# Patient Record
Sex: Male | Born: 1940
Health system: Southern US, Community
[De-identification: ages and names within clinical notes are randomized; demographics above are authoritative.]

## PROBLEM LIST (undated history)

## (undated) DIAGNOSIS — H409 Unspecified glaucoma: Secondary | ICD-10-CM

## (undated) DIAGNOSIS — R351 Nocturia: Secondary | ICD-10-CM

## (undated) DIAGNOSIS — E785 Hyperlipidemia, unspecified: Secondary | ICD-10-CM

## (undated) DIAGNOSIS — Z9989 Dependence on other enabling machines and devices: Secondary | ICD-10-CM

## (undated) DIAGNOSIS — H269 Unspecified cataract: Secondary | ICD-10-CM

## (undated) DIAGNOSIS — N529 Male erectile dysfunction, unspecified: Secondary | ICD-10-CM

## (undated) DIAGNOSIS — K4090 Unilateral inguinal hernia, without obstruction or gangrene, not specified as recurrent: Secondary | ICD-10-CM

## (undated) DIAGNOSIS — G4733 Obstructive sleep apnea (adult) (pediatric): Secondary | ICD-10-CM

## (undated) DIAGNOSIS — M1990 Unspecified osteoarthritis, unspecified site: Secondary | ICD-10-CM

## (undated) DIAGNOSIS — I10 Essential (primary) hypertension: Secondary | ICD-10-CM

## (undated) DIAGNOSIS — Z86010 Personal history of colonic polyps: Secondary | ICD-10-CM

## (undated) DIAGNOSIS — D696 Thrombocytopenia, unspecified: Secondary | ICD-10-CM

## (undated) DIAGNOSIS — G4730 Sleep apnea, unspecified: Secondary | ICD-10-CM

## (undated) HISTORY — DX: Sleep apnea, unspecified: G47.30

## (undated) HISTORY — DX: Unspecified osteoarthritis, unspecified site: M19.90

## (undated) HISTORY — DX: Unspecified cataract: H26.9

## (undated) HISTORY — DX: Male erectile dysfunction, unspecified: N52.9

## (undated) HISTORY — DX: Obstructive sleep apnea (adult) (pediatric): G47.33

## (undated) HISTORY — DX: Hyperlipidemia, unspecified: E78.5

## (undated) HISTORY — DX: Thrombocytopenia, unspecified: D69.6

## (undated) HISTORY — DX: Unspecified glaucoma: H40.9

## (undated) HISTORY — DX: Nocturia: R35.1

## (undated) HISTORY — DX: Dependence on other enabling machines and devices: Z99.89

## (undated) HISTORY — DX: Personal history of colonic polyps: Z86.010

## (undated) HISTORY — PX: OTHER SURGICAL HISTORY: SHX169

## (undated) HISTORY — DX: Essential (primary) hypertension: I10

---

## 1998-12-24 HISTORY — PX: OTHER SURGICAL HISTORY: SHX169

## 2002-12-24 HISTORY — PX: KNEE ARTHROSCOPY: SUR90

## 2006-04-18 ENCOUNTER — Encounter: Admission: RE | Admit: 2006-04-18 | Discharge: 2006-04-18 | Payer: Self-pay | Admitting: Family Medicine

## 2006-11-01 ENCOUNTER — Ambulatory Visit: Payer: Self-pay | Admitting: Family Medicine

## 2006-11-01 ENCOUNTER — Encounter: Admission: RE | Admit: 2006-11-01 | Discharge: 2006-11-01 | Payer: Self-pay | Admitting: Family Medicine

## 2006-11-01 LAB — CONVERTED CEMR LAB
Calcium: 9.7 mg/dL (ref 8.4–10.5)
Sodium: 140 meq/L (ref 135–145)

## 2006-11-12 ENCOUNTER — Ambulatory Visit: Payer: Self-pay | Admitting: Family Medicine

## 2006-11-12 LAB — CONVERTED CEMR LAB
ALT: 38 units/L (ref 0–40)
AST: 28 units/L (ref 0–37)
Chol/HDL Ratio, serum: 3.5
HDL: 46.7 mg/dL (ref >39.0)
LDL Cholesterol: 101 mg/dL — ABNORMAL HIGH (ref 0–99)
Triglyceride fasting, serum: 76 mg/dL (ref 0–149)
VLDL: 15 mg/dL (ref 0–40)

## 2006-11-19 ENCOUNTER — Encounter: Admission: RE | Admit: 2006-11-19 | Discharge: 2006-12-30 | Payer: Self-pay | Admitting: Family Medicine

## 2006-12-24 HISTORY — PX: OTHER SURGICAL HISTORY: SHX169

## 2007-05-01 DIAGNOSIS — E785 Hyperlipidemia, unspecified: Secondary | ICD-10-CM | POA: Insufficient documentation

## 2007-05-01 DIAGNOSIS — K5730 Diverticulosis of large intestine without perforation or abscess without bleeding: Secondary | ICD-10-CM | POA: Insufficient documentation

## 2007-05-01 DIAGNOSIS — F528 Other sexual dysfunction not due to a substance or known physiological condition: Secondary | ICD-10-CM

## 2007-05-01 DIAGNOSIS — I10 Essential (primary) hypertension: Secondary | ICD-10-CM | POA: Insufficient documentation

## 2007-05-05 ENCOUNTER — Encounter: Payer: Self-pay | Admitting: Family Medicine

## 2007-05-05 ENCOUNTER — Encounter (INDEPENDENT_AMBULATORY_CARE_PROVIDER_SITE_OTHER): Payer: Self-pay | Admitting: Family Medicine

## 2007-05-05 ENCOUNTER — Ambulatory Visit: Payer: Self-pay | Admitting: Family Medicine

## 2007-05-09 ENCOUNTER — Telehealth (INDEPENDENT_AMBULATORY_CARE_PROVIDER_SITE_OTHER): Payer: Self-pay | Admitting: *Deleted

## 2007-05-09 LAB — CONVERTED CEMR LAB
AST: 26 units/L (ref 0–37)
BUN: 12 mg/dL (ref 6–23)
Basophils Relative: 0.2 % (ref 0.0–1.0)
Calcium: 9.5 mg/dL (ref 8.4–10.5)
Cholesterol: 176 mg/dL (ref 0–200)
Eosinophils Absolute: 0.1 10*3/uL (ref 0.0–0.6)
Eosinophils Relative: 2.2 % (ref 0.0–5.0)
MCHC: 34.1 g/dL (ref 30.0–36.0)
Monocytes Absolute: 0.8 10*3/uL — ABNORMAL HIGH (ref 0.2–0.7)
Monocytes Relative: 10.8 % (ref 3.0–11.0)
Neutro Abs: 4.6 10*3/uL (ref 1.4–7.7)
Neutrophils Relative %: 68.6 % (ref 43.0–77.0)
RBC: 5.72 M/uL (ref 4.22–5.81)
Sodium: 139 meq/L (ref 135–145)
Triglycerides: 98 mg/dL (ref 0–149)
WBC: 6.8 10*3/uL (ref 4.5–10.5)

## 2007-05-12 ENCOUNTER — Telehealth (INDEPENDENT_AMBULATORY_CARE_PROVIDER_SITE_OTHER): Payer: Self-pay | Admitting: *Deleted

## 2007-05-22 ENCOUNTER — Ambulatory Visit: Payer: Self-pay | Admitting: Internal Medicine

## 2007-05-29 ENCOUNTER — Ambulatory Visit: Payer: Self-pay | Admitting: Family Medicine

## 2007-06-02 ENCOUNTER — Encounter (INDEPENDENT_AMBULATORY_CARE_PROVIDER_SITE_OTHER): Payer: Self-pay | Admitting: Family Medicine

## 2007-06-02 ENCOUNTER — Telehealth (INDEPENDENT_AMBULATORY_CARE_PROVIDER_SITE_OTHER): Payer: Self-pay | Admitting: *Deleted

## 2007-06-10 ENCOUNTER — Ambulatory Visit: Payer: Self-pay | Admitting: Family Medicine

## 2007-06-10 ENCOUNTER — Telehealth (INDEPENDENT_AMBULATORY_CARE_PROVIDER_SITE_OTHER): Payer: Self-pay | Admitting: Family Medicine

## 2007-06-12 ENCOUNTER — Ambulatory Visit: Payer: Self-pay | Admitting: Internal Medicine

## 2007-06-12 ENCOUNTER — Encounter (INDEPENDENT_AMBULATORY_CARE_PROVIDER_SITE_OTHER): Payer: Self-pay | Admitting: Family Medicine

## 2007-07-04 ENCOUNTER — Encounter (INDEPENDENT_AMBULATORY_CARE_PROVIDER_SITE_OTHER): Payer: Self-pay | Admitting: Family Medicine

## 2007-07-23 ENCOUNTER — Encounter (INDEPENDENT_AMBULATORY_CARE_PROVIDER_SITE_OTHER): Payer: Self-pay | Admitting: Family Medicine

## 2007-08-15 ENCOUNTER — Ambulatory Visit (HOSPITAL_BASED_OUTPATIENT_CLINIC_OR_DEPARTMENT_OTHER): Admission: RE | Admit: 2007-08-15 | Discharge: 2007-08-15 | Payer: Self-pay | Admitting: General Surgery

## 2007-08-15 ENCOUNTER — Encounter (HOSPITAL_BASED_OUTPATIENT_CLINIC_OR_DEPARTMENT_OTHER): Payer: Self-pay | Admitting: General Surgery

## 2007-09-12 ENCOUNTER — Ambulatory Visit: Payer: Self-pay | Admitting: Family Medicine

## 2007-09-12 DIAGNOSIS — D696 Thrombocytopenia, unspecified: Secondary | ICD-10-CM

## 2007-09-24 ENCOUNTER — Telehealth (INDEPENDENT_AMBULATORY_CARE_PROVIDER_SITE_OTHER): Payer: Self-pay | Admitting: *Deleted

## 2007-09-24 LAB — CONVERTED CEMR LAB
BUN: 16 mg/dL (ref 6–23)
CO2: 33 meq/L — ABNORMAL HIGH (ref 19–32)
GFR calc Af Amer: 86 mL/min
Glucose, Bld: 76 mg/dL (ref 70–99)
HCT: 45 % (ref 39.0–52.0)
Hemoglobin: 15.3 g/dL (ref 13.0–17.0)
Lymphocytes Relative: 22.4 % (ref 12.0–46.0)
MCHC: 34 g/dL (ref 30.0–36.0)
Monocytes Relative: 16.8 % — ABNORMAL HIGH (ref 3.0–11.0)
Platelets: 111 10*3/uL — ABNORMAL LOW (ref 150–400)
RBC: 5.32 M/uL (ref 4.22–5.81)
RDW: 12 % (ref 11.5–14.6)
Sodium: 141 meq/L (ref 135–145)
WBC: 5.7 10*3/uL (ref 4.5–10.5)

## 2007-09-26 ENCOUNTER — Ambulatory Visit: Payer: Self-pay | Admitting: Hematology and Oncology

## 2007-10-17 ENCOUNTER — Encounter (INDEPENDENT_AMBULATORY_CARE_PROVIDER_SITE_OTHER): Payer: Self-pay | Admitting: Family Medicine

## 2007-10-17 LAB — CBC & DIFF AND RETIC
BASO%: 0.2 % (ref 0.0–2.0)
IRF: 0.4 — ABNORMAL HIGH (ref 0.070–0.380)
MCHC: 34.4 g/dL (ref 32.0–35.9)
MCV: 81.8 fL (ref 81.6–98.0)
MONO#: 0.5 10*3/uL (ref 0.1–0.9)
RBC: 5.81 10*6/uL — ABNORMAL HIGH (ref 4.20–5.71)
RDW: 12.9 % (ref 11.2–14.6)
Retic %: 2.1 % (ref 0.7–2.3)
WBC: 5.3 10*3/uL (ref 4.0–10.0)
lymph#: 0.9 10*3/uL (ref 0.9–3.3)

## 2007-10-17 LAB — CHCC SMEAR

## 2007-10-21 LAB — PROTEIN ELECTROPHORESIS, SERUM
Albumin ELP: 58.7 % (ref 55.8–66.1)
Alpha-1-Globulin: 3.7 % (ref 2.9–4.9)
Alpha-2-Globulin: 10.3 % (ref 7.1–11.8)
Gamma Globulin: 16.2 % (ref 11.1–18.8)

## 2007-10-21 LAB — COMPREHENSIVE METABOLIC PANEL
Alkaline Phosphatase: 71 U/L (ref 39–117)
Glucose, Bld: 154 mg/dL — ABNORMAL HIGH (ref 70–99)
Sodium: 139 mEq/L (ref 135–145)
Total Bilirubin: 0.5 mg/dL (ref 0.3–1.2)
Total Protein: 7 g/dL (ref 6.0–8.3)

## 2007-10-21 LAB — VITAMIN B12: Vitamin B-12: 742 pg/mL (ref 211–911)

## 2007-10-29 ENCOUNTER — Ambulatory Visit (HOSPITAL_COMMUNITY): Admission: RE | Admit: 2007-10-29 | Discharge: 2007-10-29 | Payer: Self-pay | Admitting: Hematology and Oncology

## 2008-01-09 ENCOUNTER — Ambulatory Visit: Payer: Self-pay | Admitting: Family Medicine

## 2008-01-13 ENCOUNTER — Encounter (INDEPENDENT_AMBULATORY_CARE_PROVIDER_SITE_OTHER): Payer: Self-pay | Admitting: *Deleted

## 2008-01-13 LAB — CONVERTED CEMR LAB
AST: 25 units/L (ref 0–37)
BUN: 17 mg/dL (ref 6–23)
CO2: 34 meq/L — ABNORMAL HIGH (ref 19–32)
Cholesterol: 175 mg/dL (ref 0–200)
GFR calc non Af Amer: 58 mL/min
Glucose, Bld: 108 mg/dL — ABNORMAL HIGH (ref 70–99)
HDL: 42.8 mg/dL (ref >39.0)
VLDL: 24 mg/dL (ref 0–40)

## 2008-02-17 ENCOUNTER — Ambulatory Visit: Payer: Self-pay | Admitting: Family Medicine

## 2008-02-17 DIAGNOSIS — D485 Neoplasm of uncertain behavior of skin: Secondary | ICD-10-CM

## 2008-02-25 ENCOUNTER — Telehealth (INDEPENDENT_AMBULATORY_CARE_PROVIDER_SITE_OTHER): Payer: Self-pay | Admitting: Family Medicine

## 2008-03-01 ENCOUNTER — Encounter (INDEPENDENT_AMBULATORY_CARE_PROVIDER_SITE_OTHER): Payer: Self-pay | Admitting: *Deleted

## 2008-03-15 ENCOUNTER — Encounter (INDEPENDENT_AMBULATORY_CARE_PROVIDER_SITE_OTHER): Payer: Self-pay | Admitting: Family Medicine

## 2008-04-14 ENCOUNTER — Ambulatory Visit: Payer: Self-pay | Admitting: Hematology and Oncology

## 2008-05-07 ENCOUNTER — Ambulatory Visit: Payer: Self-pay | Admitting: Internal Medicine

## 2008-05-14 ENCOUNTER — Telehealth (INDEPENDENT_AMBULATORY_CARE_PROVIDER_SITE_OTHER): Payer: Self-pay | Admitting: *Deleted

## 2008-05-28 ENCOUNTER — Ambulatory Visit: Payer: Self-pay | Admitting: Internal Medicine

## 2008-05-31 ENCOUNTER — Encounter (INDEPENDENT_AMBULATORY_CARE_PROVIDER_SITE_OTHER): Payer: Self-pay | Admitting: *Deleted

## 2008-05-31 LAB — CONVERTED CEMR LAB
BUN: 13 mg/dL (ref 6–23)
CO2: 29 meq/L (ref 19–32)
Chloride: 101 meq/L (ref 96–112)
Glucose, Bld: 79 mg/dL (ref 70–99)

## 2008-06-03 ENCOUNTER — Ambulatory Visit: Payer: Self-pay | Admitting: Hematology and Oncology

## 2008-08-06 ENCOUNTER — Ambulatory Visit: Payer: Self-pay | Admitting: Internal Medicine

## 2008-08-06 DIAGNOSIS — R252 Cramp and spasm: Secondary | ICD-10-CM | POA: Insufficient documentation

## 2008-08-11 ENCOUNTER — Telehealth (INDEPENDENT_AMBULATORY_CARE_PROVIDER_SITE_OTHER): Payer: Self-pay | Admitting: *Deleted

## 2008-08-11 LAB — CONVERTED CEMR LAB
CO2: 30 meq/L (ref 19–32)
GFR calc Af Amer: 95 mL/min
GFR calc non Af Amer: 79 mL/min
Glucose, Bld: 84 mg/dL (ref 70–99)
Potassium: 4 meq/L (ref 3.5–5.1)

## 2008-08-12 ENCOUNTER — Telehealth: Payer: Self-pay | Admitting: Internal Medicine

## 2008-10-06 ENCOUNTER — Ambulatory Visit: Payer: Self-pay | Admitting: Internal Medicine

## 2008-10-06 DIAGNOSIS — M1990 Unspecified osteoarthritis, unspecified site: Secondary | ICD-10-CM

## 2008-12-06 ENCOUNTER — Ambulatory Visit: Payer: Self-pay | Admitting: Internal Medicine

## 2009-05-03 ENCOUNTER — Ambulatory Visit: Payer: Self-pay | Admitting: Internal Medicine

## 2009-05-03 DIAGNOSIS — R399 Unspecified symptoms and signs involving the genitourinary system: Secondary | ICD-10-CM | POA: Insufficient documentation

## 2009-05-03 LAB — CONVERTED CEMR LAB
Ketones, urine, test strip: NEGATIVE
Nitrite: NEGATIVE
Protein, U semiquant: NEGATIVE
RBC / HPF: NONE SEEN (ref <3)
Specific Gravity, Urine: <1.005

## 2009-05-04 ENCOUNTER — Encounter: Payer: Self-pay | Admitting: Internal Medicine

## 2009-05-10 ENCOUNTER — Ambulatory Visit: Payer: Self-pay | Admitting: Internal Medicine

## 2009-05-16 ENCOUNTER — Ambulatory Visit: Payer: Self-pay | Admitting: Internal Medicine

## 2009-05-19 LAB — CONVERTED CEMR LAB
ALT: 28 units/L (ref 0–53)
Basophils Relative: 0.4 % (ref 0.0–3.0)
Calcium: 8.9 mg/dL (ref 8.4–10.5)
Chloride: 108 meq/L (ref 96–112)
Eosinophils Relative: 1.4 % (ref 0.0–5.0)
HCT: 44.9 % (ref 39.0–52.0)
Hemoglobin: 15.3 g/dL (ref 13.0–17.0)
Hgb A1c MFr Bld: 4.8 % (ref 4.6–6.5)
LDL Cholesterol: 78 mg/dL (ref 0–99)
MCHC: 34.1 g/dL (ref 30.0–36.0)
MCV: 84.8 fL (ref 78.0–100.0)
Neutrophils Relative %: 68.4 % (ref 43.0–77.0)
Platelets: 94 10*3/uL — ABNORMAL LOW (ref 150.0–400.0)
Potassium: 4.3 meq/L (ref 3.5–5.1)
Total CHOL/HDL Ratio: 3
VLDL: 11.4 mg/dL (ref 0.0–40.0)
WBC: 6.2 10*3/uL (ref 4.5–10.5)

## 2010-01-09 ENCOUNTER — Ambulatory Visit: Payer: Self-pay | Admitting: Internal Medicine

## 2010-01-18 ENCOUNTER — Encounter: Payer: Self-pay | Admitting: Internal Medicine

## 2010-05-10 ENCOUNTER — Ambulatory Visit: Payer: Self-pay | Admitting: Internal Medicine

## 2010-05-10 LAB — CONVERTED CEMR LAB
Nitrite: NEGATIVE
Specific Gravity, Urine: <1.005
pH: 7.5

## 2010-05-12 ENCOUNTER — Telehealth (INDEPENDENT_AMBULATORY_CARE_PROVIDER_SITE_OTHER): Payer: Self-pay | Admitting: *Deleted

## 2010-05-12 LAB — CONVERTED CEMR LAB
Basophils Absolute: 0 10*3/uL (ref 0.0–0.1)
CO2: 30 meq/L (ref 19–32)
Calcium: 9.1 mg/dL (ref 8.4–10.5)
Casts: NONE SEEN /lpf
Cholesterol: 180 mg/dL (ref 0–200)
Eosinophils Absolute: 0.1 10*3/uL (ref 0.0–0.7)
GFR calc non Af Amer: 101.68 mL/min (ref >60)
Glucose, Bld: 80 mg/dL (ref 70–99)
HDL: 65.2 mg/dL (ref >39.00)
Hemoglobin: 15.6 g/dL (ref 13.0–17.0)
LDL Cholesterol: 109 mg/dL — ABNORMAL HIGH (ref 0–99)
Lymphocytes Relative: 20.3 % (ref 12.0–46.0)
MCHC: 34.1 g/dL (ref 30.0–36.0)
MCV: 85.7 fL (ref 78.0–100.0)
Neutrophils Relative %: 63.2 % (ref 43.0–77.0)
RBC / HPF: NONE SEEN (ref <3)
Squamous Epithelial / LPF: NONE SEEN /lpf
Total CHOL/HDL Ratio: 3
Triglycerides: 29 mg/dL (ref 0.0–149.0)
WBC: 4.2 10*3/uL — ABNORMAL LOW (ref 4.5–10.5)

## 2010-05-17 ENCOUNTER — Telehealth (INDEPENDENT_AMBULATORY_CARE_PROVIDER_SITE_OTHER): Payer: Self-pay | Admitting: *Deleted

## 2010-05-19 ENCOUNTER — Ambulatory Visit: Payer: Self-pay | Admitting: Internal Medicine

## 2010-05-19 LAB — CONVERTED CEMR LAB
Casts: NONE SEEN /lpf
Squamous Epithelial / LPF: NONE SEEN /lpf
WBC, UA: NONE SEEN cells/hpf (ref <3)

## 2010-05-20 ENCOUNTER — Encounter: Payer: Self-pay | Admitting: Internal Medicine

## 2010-05-25 ENCOUNTER — Telehealth (INDEPENDENT_AMBULATORY_CARE_PROVIDER_SITE_OTHER): Payer: Self-pay | Admitting: *Deleted

## 2010-11-13 ENCOUNTER — Ambulatory Visit: Payer: Self-pay | Admitting: Internal Medicine

## 2010-11-14 LAB — CONVERTED CEMR LAB
Eosinophils Relative: 1.1 % (ref 0.0–5.0)
HCT: 43 % (ref 39.0–52.0)
Lymphocytes Relative: 16.3 % (ref 12.0–46.0)
Lymphs Abs: 1 10*3/uL (ref 0.7–4.0)
MCHC: 33.3 g/dL (ref 30.0–36.0)
MCV: 85.1 fL (ref 78.0–100.0)
Monocytes Relative: 9.2 % (ref 3.0–12.0)
RBC: 5.06 M/uL (ref 4.22–5.81)
RDW: 12.7 % (ref 11.5–14.6)

## 2011-01-21 LAB — CONVERTED CEMR LAB
AST: 30 units/L (ref 0–37)
BUN: 12 mg/dL (ref 6–23)
Basophils Relative: 0.2 % (ref 0.0–1.0)
CO2: 31 meq/L (ref 19–32)
Creatinine, Ser: 1.1 mg/dL (ref 0.4–1.5)
Eosinophils Absolute: 0.1 10*3/uL (ref 0.0–0.7)
Eosinophils Relative: 2 % (ref 0.0–5.0)
GFR calc non Af Amer: 71 mL/min
HCT: 45.7 % (ref 39.0–52.0)
LDL Cholesterol: 99 mg/dL (ref 0–99)
MCHC: 33.6 g/dL (ref 30.0–36.0)
Neutro Abs: 3.9 10*3/uL (ref 1.4–7.7)
PSA: 1.45 ng/mL (ref 0.10–4.00)
Platelets: 100 10*3/uL — ABNORMAL LOW (ref 150–400)
RDW: 12.4 % (ref 11.5–14.6)
TSH: 1.83 microintl units/mL (ref 0.35–5.50)
Total CHOL/HDL Ratio: 3.9
Triglycerides: 98 mg/dL (ref 0–149)
VLDL: 20 mg/dL (ref 0–40)

## 2011-01-25 NOTE — Assessment & Plan Note (Signed)
Summary: cpx/ns/kdc   Vital Signs:  Patient profile:   70 year old male Height:      69 inches Weight:      190.8 pounds BMI:     28.28 Pulse rate:   60 / minute BP sitting:   156 / 90  Vitals Entered By: Shary Decamp (May 10, 2010 8:10 AM) CC: yearly, fasting Is Patient Diabetic? No   History of Present Illness: cont w/ nocturia (x 3 or 4 times)   Hyperlipidemia-- on diet only, diet is healthy but has issues w/ portion control but also very active in the yard   Hypertension-- bp elevated today, no bp meds today; usually ambulatory BPs are 130-140/ 70  Osteoarthritis-- no major probles w/ pain, 1 week h/o R thumb "clicking"  yearly checkup, chart reviewed  Current Medications (verified): 1)  Cartia Xt 240 Mg  Xr24h-Cap (Diltiazem Hcl Coated Beads) .... 2 By Mouth Once Daily 2)  Adult Aspirin Ec Low Strength 81 Mg  Tbec (Aspirin) 3)  Multivitamins   Caps (Multiple Vitamin) 4)  Levitra 20 Mg Tabs (Vardenafil Hcl) .Marland Kitchen.. 1 By Mouth Once Daily As Needed  Allergies (verified): No Known Drug Allergies  Past History:  Past Medical History: Reviewed history from 01/09/2010 and no changes required. Diverticulosis, colon Hyperlipidemia Hypertension ED thrombocytopenia--saw hematology , observe Osteoarthritis  Past Surgical History: Reviewed history from 10/06/2008 and no changes required. 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 CV:  Denies chest pain or discomfort and swelling of  feet. Resp:  Denies cough and shortness of breath. GI:  Denies bloody stools, nausea, and vomiting. GU:  Denies dysuria, hematuria, and urinary hesitancy; some urgency .  Physical Exam  General:  alert, well-developed, and well-nourished.   Neck:  no masses, no thyromegaly, and normal carotid upstroke.   Lungs:  normal respiratory effort, no intercostal retractions, no accessory muscle use, and normal breath sounds.   Heart:  normal rate, regular rhythm,  question  a  systolic murmur Abdomen:  soft, non-tender, no distention, no masses, no guarding, and no rigidity.   Rectal:  + ext hemorrhoids . Normal sphincter tone. No rectal masses or tenderness but he seems to have several tags  Prostate:  slightly asymmetric prostate, larger on the right side, no nodules, no tenderness ( asymmetry is not a new finding, previously noted by urology)   Impression & Recommendations:  Problem # 1:  NOCTURIA (EAV-409.81) chronic problem Check PSA, ua Offered Flomax, declined   Orders: TLB-PSA (Prostate Specific Antigen) (84153-PSA) Specimen Handling ([address]) T-Urine Microscopic (82956-21308) T-Culture, Urine (65784-69629) UA Dipstick w/o Micro (manual) (81002)  Problem # 2:  HEALTH SCREENING (ICD-V70.0) Td 05 pneumonia shot 2005 and  2010 shingles vaccine 2010  Cscope 6-08  (Dr Leone Payor) neg but had a polypoid anal lesion, lesion removed by Dr Lurene Shadow next Cscope-- 10 years   on today's exam, he seems to have few anal tags. Close observation, repeat DRE in 6 months  Continue  with a healthy lifestyle     Problem # 3:  OSTEOARTHRITIS (ICD-715.90) has trigger finger , R thumb on physical exam ,  rec to call if no better  His updated medication list for this problem includes:    Adult Aspirin Ec Low Strength 81 Mg Tbec (Aspirin)  Problem # 4:  THROMBOCYTOPENIA (ICD-287.5) labs  Orders: TLB-CBC Platelet - w/Differential (85025-CBCD)  Problem # 5:  HYPERTENSION (ICD-401.9) BP slightly  elevated today, will cont. checking ambulatory BPs  His updated medication list for this problem includes:    Cartia Xt 240 Mg Xr24h-cap (Diltiazem hcl coated beads) .Marland Kitchen... 2 by mouth once daily  BP today: 156/90 Prior BP: 142/60 (01/09/2010)  Labs Reviewed: K+: 4.3 (05/16/2009) Creat: : 1.2 (05/16/2009)   Chol: 142 (05/16/2009)   HDL: 52.20 (05/16/2009)   LDL: 78 (05/16/2009)   TG: 57.0 (05/16/2009)  Orders: Venipuncture ([address]) TLB-BMP (Basic Metabolic Panel-BMET) (80048-METABOL) Prescription Created Electronically [phone])  Problem # 6:  HYPERLIPIDEMIA (ICD-272.4) on diet only, labs  Labs Reviewed: SGOT: 23 (05/16/2009)   SGPT: 28 (05/16/2009)   HDL:52.20 (05/16/2009), 41.3 (05/07/2008)  LDL:78 (05/16/2009), 99 (05/07/2008)  Chol:142 (05/16/2009), 160 (05/07/2008)  Trig:57.0 (05/16/2009), 98 (05/07/2008)  Orders: TLB-TSH (Thyroid Stimulating Hormone) (84443-TSH) TLB-Lipid Panel (80061-LIPID)  Complete Medication List: 1)  Cartia Xt 240 Mg Xr24h-cap (Diltiazem hcl coated beads) .... 2 by mouth once daily 2)  Adult Aspirin Ec Low Strength 81 Mg Tbec (Aspirin) 3)  Multivitamins Caps (Multiple vitamin) 4)  Levitra 20 Mg Tabs (Vardenafil hcl) .Marland Kitchen.. 1 by mouth once daily as needed  Patient Instructions: 1)  Please schedule a follow-up appointment in 6 months .  Prescriptions: LEVITRA 20 MG TABS (VARDENAFIL HCL) 1 by mouth once daily as needed  #12 x 3   Entered and Authorized by:   Elita Quick E. Tyger Wichman MD   Signed by:   Nolon Rod. Han Lysne MD on 05/10/2010   Method used:   Electronically to        [facility]  [address] [id number]       [address]       [place], [place]  [address]       Ph: [phone] or [phone]       Fax: [phone]   RxID:   [id number] CARTIA XT 240 MG  XR24H-CAP (DILTIAZEM HCL COATED BEADS) 2 by mouth once daily  #180 x 3   Entered and Authorized by:   Elita Quick E. Eric Nees MD   Signed by:   Nolon Rod. Daley Mooradian MD on 05/10/2010   Method used:    [facility] to        [facility]  [address] [id number]       [address]       [place], [place]  [address]       Ph: [phone] or [phone]       Fax: [phone]   RxID:   [id number]    Preventive Care Screening  Prior Values:    PSA:  1.68 (05/16/2009)    Colonoscopy:  Diverticulosis (06/12/2007)    Last Tetanus Booster:  given (04/17/2004)    Last Pneumovax:  Pneumovax (Medicare) (05/03/2009)   Laboratory Results   Urine Tests   Date/Time Reported: May 10, 2010 9:11 AM   Routine Urinalysis   Color: yellow Appearance: Hazy Glucose: negative   (Normal Range: Negative) Bilirubin: negative   (Normal Range: Negative) Ketone: negative   (  Normal Range: Negative) Spec. Gravity: <1.005   (Normal Range: 1.003-1.035) Blood: small   (Normal Range: Negative) pH: 7.5   (Normal Range: 5.0-8.0) Protein: trace   (Normal Range: Negative) Urobilinogen: negative   (Normal Range: 0-1) Nitrite: negative   (Normal Range: Negative) Leukocyte Esterace: trace   (Normal Range: Negative)    Comments: Floydene Flock  May 10, 2010 9:12 AM cx sent

## 2011-01-25 NOTE — Progress Notes (Signed)
Summary: culture result  Phone Note Outgoing Call   Call placed by: Jeremy Johann CMA,  May 25, 2010 3:27 PM Details for Reason: advise patient:  amoxicillin 500 mg 2 tablets twice a day for 10 days  Summary of Call: left message to call office..............Marland KitchenFelecia Deloach CMA  May 25, 2010 3:27 PM  left message to call  office.............Marland KitchenFelecia Deloach CMA  May 26, 2010 9:33 AM  left message to call  office with pt wife will have pt call back to confirmed pharmacy...........Marland KitchenFelecia Deloach CMA  May 29, 2010 9:55 AM  pt called back to confirm phamacy.Marland Kitchenleft pt message with wife rx sent to pharmacy..............Marland KitchenFelecia Deloach CMA  May 29, 2010 1:29 PM     New/Updated Medications: AMOXICILLIN 500 MG TABS (AMOXICILLIN) Take 2 tablets twice a day for 10 days Prescriptions: AMOXICILLIN 500 MG TABS (AMOXICILLIN) Take 2 tablets twice a day for 10 days  #40 x 0   Entered by:   Jeremy Johann CMA   Authorized by:   Nolon Rod. Paz MD   Signed by:   Jeremy Johann CMA on 05/29/2010   Method used:   [facility]ed to ...       [facility]  [address] [id number]       [address]       [place], [place]  [address]       Ph: [phone] or [phone]       Fax: [phone]   RxID:   [id number]

## 2011-01-25 NOTE — Assessment & Plan Note (Signed)
Summary: RTO 6 MONTHS/CBS   Vital Signs:  Patient profile:   70 year old male Height:      69 inches (175.26 cm) Weight:      201.50 pounds (91.59 kg) BMI:     29.86 O2 Sat:      95 % on Room air Temp:     97.7 degrees F (36.50 degrees C) oral Pulse rate:   69 / minute BP sitting:   140 / 66  (left arm)  Vitals Entered By: Lucious Groves CMA (November 13, 2010 1:03 PM)  O2 Flow:  Room air CC: 6 mo rtn ov--no concerns./kb Is Patient Diabetic? No Pain Assessment Patient in pain? no        History of Present Illness: routine office visit Continue with nocturia, getting worse? Right thumb, had a trigger phenomena which resolved, now he is unable to fully extend the thumb   Current Medications (verified): 1)  Cartia Xt 240 Mg  Xr24h-Cap (Diltiazem Hcl Coated Beads) .... 2 By Mouth Once Daily 2)  Adult Aspirin Ec Low Strength 81 Mg  Tbec (Aspirin) 3)  Multivitamins   Caps (Multiple Vitamin) 4)  Levitra 20 Mg Tabs (Vardenafil Hcl) .Marland Kitchen.. 1 By Mouth Once Daily As Needed  Allergies (verified): No Known Drug Allergies  Past History:  Past Medical History: Diverticulosis, colon Hyperlipidemia Hypertension ED thrombocytopenia--saw hematology , observe Osteoarthritis chronic nocturia  Past Surgical History: Reviewed history from 10/06/2008 and no changes required. 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   Lungs:  normal respiratory effort, no intercostal retractions, no accessory muscle use, and normal breath sounds.   Heart:  normal rate,  regular rhythm,  question of  systolic murmur Msk:  left thumb normal Right thumb with decreased extension. Some changes suggestive of DJD  Extremities:  no pretibial edema bilaterally    Impression & Recommendations:  Problem # 1:  OSTEOARTHRITIS (ICD-715.90) sx likely DJD declined a  referral to hand surgeon to see about the right thumb. Decline x-ray Plan: Observation  His updated medication list for this problem includes:    Adult Aspirin Ec Low Strength 81 Mg Tbec (Aspirin)  Problem # 2:  NOCTURIA (ZOX-096.04) continue with nocturia, now willing to do a trial with Flomax Last DRE  few month ago, showed an asymmetric prostate; nothing new, similar  findings  by urology in 2008 PSA normal 5/11  Problem # 3:  THROMBOCYTOPENIA (ICD-287.5)  Orders: Venipuncture (54098) TLB-CBC Platelet - w/Differential (85025-CBCD) Specimen Handling (11914)  Complete Medication List: 1)  Cartia Xt 240 Mg Xr24h-cap (Diltiazem hcl coated beads) .... 2 by mouth once daily 2)  Adult Aspirin Ec Low Strength 81 Mg Tbec (Aspirin) 3)  Multivitamins Caps (Multiple vitamin) 4)  Levitra 20 Mg Tabs (Vardenafil hcl) .Marland Kitchen.. 1 by mouth once daily as needed 5)  Tamsulosin Hcl 0.4 Mg Caps (Tamsulosin hcl) .... On by mouth daily  Patient Instructions: 1)  Please schedule a follow-up appointment in 6 months, fasting, physical exam Prescriptions: TAMSULOSIN HCL 0.4 MG CAPS (TAMSULOSIN HCL) on by mouth  daily  #30 x 6   Entered and Authorized by:   Nolon Rod. Paz MD   Signed by:   Nolon Rod. Paz MD on 11/13/2010   Method used:   [facility]        [facility]  [address] [id number]       [address]       [place], [place]  [address]       Ph: [phone] or [phone]       Fax: [phone]   RxID:   [id number]    Orders Added: 1)  Venipuncture [36644] 2)  TLB-CBC Platelet - w/Differential [85025-CBCD] 3)  Specimen Handling [99000] 4)  Est. Patient Level III [03474]

## 2011-01-25 NOTE — Progress Notes (Signed)
Summary: needs repeat u.cx  Phone Note Outgoing Call Call back at Home Phone [phone] Call back at Work Phone [phone]   Reason for Call: Discuss lab or test results Details for Reason: please ask patient to redo the urine culture. Remind him about aseptic technique to collect the  urine Signed by [facility] E. Paz MD on 05/16/2010 at 5:27 PM Summary of Call: left message on machine for pt to return call.........Marland KitchenShary Decamp  May 17, 2010 2:48 PM left message with family member to have pt return call....Marland KitchenMarland KitchenShary Decamp  May 19, 2010 9:52 AM discussed with pt.........Marland KitchenShary Decamp  May 19, 2010 10:06 AM

## 2011-01-25 NOTE — Progress Notes (Signed)
Summary: results,  Phone Note Outgoing Call Call back at Home Phone [phone] Call back at Work Phone [phone]   Reason for Call: Discuss lab or test results Details for Reason: advised patient: his cholesterol is good, used to be slightly better, watch diet and try to exercise more His platelet count is low, slightly lower than usual. Recheck in 6 months All other labs within normal Good results Signed by [facility] E. Paz MD on 05/12/2010 at 1:58 PM  Summary of Call: left message on machine for pt to return call.......Marland KitchenShary Decamp  May 12, 2010 4:35 PM discussed with pt, copy mailed to pt.......Marland KitchenShary Decamp  May 15, 2010 1:08 PM

## 2011-01-25 NOTE — Consult Note (Signed)
Summary: [facility]  [facility]   Imported By: Lanelle Bal 01/31/2010 07:59:17  _____________________________________________________________________  External Attachment:    Type:   Image     Comment:   External Document

## 2011-01-25 NOTE — Assessment & Plan Note (Signed)
Summary: 6 MONTH FOLLOW UP/[facility]   Vital Signs:  Patient profile:   70 year old male Height:      69 inches Weight:      190.6 pounds Pulse rate:   80 / minute BP sitting:   142 / 60  Vitals Entered By: Shary Decamp (January 09, 2010 3:26 PM) CC: rov - pt not taking simvastatin x months (he doesn't feel like he needs it)   History of Present Illness: high cholesterol:  pt not taking simvastatin x months (he doesn't feel like he needs it)  [id number] not  working as well as before  has a skin lesion in the lip, no itching-pain    Current Medications (verified): 1)  Cartia Xt 240 Mg  Xr24h-Cap (Diltiazem Hcl Coated Beads) .... 2 By Mouth Once Daily 2)  Adult Aspirin Ec Low Strength 81 Mg  Tbec (Aspirin) 3)  Multivitamins   Caps (Multiple Vitamin) 4)  Viagra 100 Mg  Tabs (Sildenafil Citrate) .Marland Kitchen.. 1 By Mouth Once Daily As Needed  Allergies (verified): No Known Drug Allergies  Past History:  Past Medical History: Diverticulosis, colon Hyperlipidemia Hypertension ED thrombocytopenia--saw hematology , observe Osteoarthritis  Past Surgical History: Reviewed history from 10/06/2008 and no changes required. 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 CV:  Denies chest pain or discomfort and swelling of feet; ambulatory BPs  130/70 .  Physical Exam  General:  alert and well-developed.   Lungs:  normal respiratory effort, no intercostal retractions, no accessory muscle use, and normal breath sounds.   Heart:  normal rate, regular rhythm,  question  a  systolic murmur Skin:  lower lip, left side  has a  1x0.3 cm patch hypopigmented and hypotrophic the skin     Impression & Recommendations:  Problem # 1:  ERECTILE DYSFUNCTION (ICD-302.72) trial with Levitra, patient aware of side effects samples (3 tablets) as well as two prescriptions provided  after the trial he can take either Viagra or  Levitra His updated medication list for this problem includes:    Viagra 100 Mg Tabs (Sildenafil citrate) .Marland Kitchen... 1 by mouth once daily as needed    Levitra 20 Mg Tabs (Vardenafil hcl) .Marland Kitchen... 1 by mouth once daily as needed  Problem # 2:  HYPERTENSION (ICD-401.9) no change His updated medication list for this problem includes:    Cartia Xt 240 Mg Xr24h-cap (Diltiazem hcl coated beads) .Marland Kitchen... 2 by mouth once daily  BP today: 142/60 Prior BP: 150/80 (05/03/2009)  Labs Reviewed: K+: 4.3 (05/16/2009) Creat: : 1.2 (05/16/2009)   Chol: 142 (05/16/2009)   HDL: 52.20 (05/16/2009)   LDL: 78 (05/16/2009)   TG: 57.0 (05/16/2009)  Problem # 3:  HYPERLIPIDEMIA (ICD-272.4) the patient states today that he has been off medication for more than a year consequently the labs from May 2010  reflect diet  only treatment for cholesterol. Again review with patient the benefits of statins  in the setting of a positive family history he decided no to take  medication at this time The following medications were removed from the medication list:    Simvastatin 10 Mg Tabs (Simvastatin) .Marland KitchenMarland KitchenMarland KitchenMarland Kitchen  1 by mouth at bedtime  Labs Reviewed: SGOT: 23 (05/16/2009)   SGPT: 28 (05/16/2009)   HDL:52.20 (05/16/2009), 41.3 (05/07/2008)  LDL:78 (05/16/2009), 99 (05/07/2008)  Chol:142 (05/16/2009), 160 (05/07/2008)  Trig:57.0 (05/16/2009), 98 (05/07/2008)  Problem # 4:  SKIN LESION (ICD-709.9)  unclear etiology, derm referral  Orders: Dermatology Referral (Derma)  Complete Medication List: 1)  Cartia Xt 240 Mg Xr24h-cap (Diltiazem hcl coated beads) .... 2 by mouth once daily 2)  Adult Aspirin Ec Low Strength 81 Mg Tbec (Aspirin) 3)   Multivitamins Caps (Multiple vitamin) 4)  Viagra 100 Mg Tabs (Sildenafil citrate) .Marland Kitchen.. 1 by mouth once daily as needed 5)  Levitra 20 Mg Tabs (Vardenafil hcl) .Marland Kitchen.. 1 by mouth once daily as needed  Patient Instructions: 1)  Please schedule a follow-up appointment in 4 months (May 2011) for a yearly check up. Came back fasting Prescriptions: LEVITRA 20 MG TABS (VARDENAFIL HCL) 1 by mouth once daily as needed  #2 x 0   Entered and Authorized by:   Nolon Rod. Drucella Karbowski MD   Signed by:   Nolon Rod. Rashema Seawright MD on 01/09/2010   Method used:   Print then Give to Patient   RxID:   [id number] LEVITRA 20 MG TABS (VARDENAFIL HCL) 1 by mouth once daily as needed  #10 x 0   Entered and Authorized by:   Nolon Rod. Karrington Studnicka MD   Signed by:   Nolon Rod. Sharalee Witman MD on 01/09/2010   Method used:   Print then Give to Patient   RxID:   [id number]

## 2011-04-22 ENCOUNTER — Other Ambulatory Visit: Payer: Self-pay | Admitting: Internal Medicine

## 2011-05-08 NOTE — Assessment & Plan Note (Signed)
[facility]                         GASTROENTEROLOGY OFFICE NOTE   DERELL, BRUUN                         MRN:          [id number]  DATE:05/22/2007                            DOB:          1941-09-28    CHIEF COMPLAINT:  Rectal pain.   REQUESTING PHYSICIAN:  Leanne Chang, M.D.   ASSESSMENT:  A 70 year old African-American male that has some painful  defecation that started a couple of months ago.  Rectal exam by Dr.  Blossom Hoops about two weeks ago was unrevealing.  It could be a fissure.  It has been about four years since he has had a flexible sigmoidoscopy.  Colorectal neoplasia could potentially be playing a role.   PLAN:  1. Schedule colonoscopy to investigate this further.  2. Stool softener, as there is some straining to defecate, which      represents some change in bowel habits.  3. Further plans pending the above.  I have explained the risks,      benefits and indications of the procedure.  He understands and      agrees to proceed.   HISTORY:  As above, see my medical history form for further details.  I  have reviewed Dr. Laqueta Linden notes.   PAST MEDICAL HISTORY:  1. Hypertension.  2. Erectile dysfunction.  3. Dyslipidemia.  4. Benign tumor removed from right arm.  5. Sinus surgery for benign tumor.  6. Knee surgery.   FAMILY HISTORY:  Noncontributory.  Father had heart disease.   SOCIAL HISTORY:  He is married.  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  He has an Set designer.  Two sons, one daughter.  He  lives with his wife.  Some alcohol.  No tobacco or drugs.   REVIEW OF SYSTEMS:  He feels like he urinates excessively.  All other  systems are negative.   PHYSICAL EXAMINATION:  GENERAL:  A well-developed and well-nourished  African-American man in no acute distress.  VITAL SIGNS:  Height 5 feet 9.  Weight 213 pounds.  He is overweight.  Blood pressure is 138/60.  Pulse 88.  HEENT:  Eyes anicteric.  ENT:  Small  torus palatinus, otherwise normal  mouth and posterior pharynx.  NECK:  Supple.  No thyromegaly.  CHEST:  Clear.  HEART:  S1 and S2.  No rubs, no gallops.  ABDOMEN:  Soft and nontender without organomegaly or mass.  RECTAL:  Deferred.  LYMPHATICS:  No neck or supraclavicular nodes.  EXTREMITIES:  Free of edema.  The toenails have changes consistent with  onychomycosis.  SKIN:  No rash.  PSYCH:  He is alert and oriented x3.   I appreciate the opportunity to care for this patient.     Iva Boop, MD,FACG  Electronically Signed    CEG/MedQ  DD: 05/22/2007  DT: 05/22/2007  Job #: [id number]   cc:   Leanne Chang, M.D.

## 2011-05-08 NOTE — Op Note (Signed)
NAMERAOUL, CIANO NO.:  [id number]   MEDICAL RECORD NO.:  [id number]          PATIENT TYPE:  AMB   LOCATION:  DSC                          FACILITY:  [facility]   PHYSICIAN:  Leonie Man, M.D.   DATE OF BIRTH:  1941-01-03   DATE OF PROCEDURE:  08/15/2007  DATE OF DISCHARGE:                               OPERATIVE REPORT   PREOPERATIVE DIAGNOSIS:  Anal polyps.   POSTOPERATIVE DIAGNOSIS:  Anal polyps.   PROCEDURE:  Excision of anal polyps.   SURGEON:  Leonie Man, M.D.   ASSISTANT:  None.   ANESTHESIA:  General.   SPECIMENS TO LAB:  Anal polyps x2.   ESTIMATED BLOOD LOSS:  Minimal.   COMPLICATIONS:  None.   The patient returned to the PACU in excellent condition.   [name] is a 70 year old man, a patient of Dr. Leanne Chang and Dr.  Leone Payor, who at the time of colonoscopy was noted to have a polypoid  lesion within the anus just about at the area of the dentate line.  Mr.  Valbuena had a history of intermittent problems from hemorrhoidal disease  from time to time but this has not been an outstanding problem for him.  He does not have any chronic anal bleeding or chronic constipation.  Review of his colonoscopy did show that the remainder of this  colonoscopy was without any abnormality, then he is recommended to  return for colposcopy in 10 years.  The patient comes to the operating  room now after the risks and potential benefits of surgery have been  fully discussed, all questions answered and consent obtained.   Following the induction of satisfactory general anesthesia with the  patient positioned in lithotomy position, positive identification of the  patient as Darren Barnes was made and the operation as excision of anal  polyps.  The operating anoscope was placed into the anus after  dilatation of the anal verge up to three fingerbreadths.  Approximately  at the dentate line there were two what appeared to be hypertrophic  polyps noted.   There was also a small but fresh anal fissure noted.  The  areas around the polyps were then infiltrated with 0.5% Marcaine with  epinephrine and the polyps were excised using electrocautery and  forwarded for pathologic evaluation.  Hemostasis was noted to be  excellent.  A Gelfoam gauze pad was placed into the anus over the areas  of dissection and left in place.  Sponge and instrument counts were  verified and a dressing placed on the wounds, the anesthetic reversed  and the patient removed from the operating room to the recovery room in  stable condition.  He tolerated the procedure well.      Leonie Man, M.D.  Electronically Signed     PB/MEDQ  D:  08/15/2007  T:  08/16/2007  Job:  [id number]   cc:   Leanne Chang, M.D.

## 2011-05-15 ENCOUNTER — Ambulatory Visit (INDEPENDENT_AMBULATORY_CARE_PROVIDER_SITE_OTHER): Payer: BC Managed Care – PPO | Admitting: Internal Medicine

## 2011-05-15 ENCOUNTER — Encounter: Payer: Self-pay | Admitting: Internal Medicine

## 2011-05-15 DIAGNOSIS — Z0000 Encounter for general adult medical examination without abnormal findings: Secondary | ICD-10-CM | POA: Insufficient documentation

## 2011-05-15 DIAGNOSIS — D696 Thrombocytopenia, unspecified: Secondary | ICD-10-CM

## 2011-05-15 DIAGNOSIS — Z125 Encounter for screening for malignant neoplasm of prostate: Secondary | ICD-10-CM

## 2011-05-15 DIAGNOSIS — R252 Cramp and spasm: Secondary | ICD-10-CM

## 2011-05-15 DIAGNOSIS — E785 Hyperlipidemia, unspecified: Secondary | ICD-10-CM

## 2011-05-15 DIAGNOSIS — R351 Nocturia: Secondary | ICD-10-CM

## 2011-05-15 DIAGNOSIS — F528 Other sexual dysfunction not due to a substance or known physiological condition: Secondary | ICD-10-CM

## 2011-05-15 DIAGNOSIS — I10 Essential (primary) hypertension: Secondary | ICD-10-CM

## 2011-05-15 LAB — LIPID PANEL
Cholesterol: 187 mg/dL (ref 0–200)
LDL Cholesterol: 114 mg/dL — ABNORMAL HIGH (ref 0–99)
Total CHOL/HDL Ratio: 3
Triglycerides: 50 mg/dL (ref 0.0–149.0)

## 2011-05-15 LAB — BASIC METABOLIC PANEL
CO2: 28 mEq/L (ref 19–32)
Calcium: 9.2 mg/dL (ref 8.4–10.5)
Creatinine, Ser: 1.1 mg/dL (ref 0.4–1.5)
Glucose, Bld: 99 mg/dL (ref 70–99)
Potassium: 4.3 mEq/L (ref 3.5–5.1)
Sodium: 141 mEq/L (ref 135–145)

## 2011-05-15 LAB — CBC WITH DIFFERENTIAL/PLATELET
Basophils Absolute: 0 10*3/uL (ref 0.0–0.1)
Eosinophils Absolute: 0.1 10*3/uL (ref 0.0–0.7)
Hemoglobin: 16 g/dL (ref 13.0–17.0)
MCV: 84.7 fl (ref 78.0–100.0)
Monocytes Absolute: 0.5 10*3/uL (ref 0.1–1.0)
RBC: 5.56 Mil/uL (ref 4.22–5.81)

## 2011-05-15 LAB — PSA: PSA: 1.69 ng/mL (ref 0.10–4.00)

## 2011-05-15 LAB — ALT: ALT: 35 U/L (ref 0–53)

## 2011-05-15 LAB — TSH: TSH: 1.23 u[IU]/mL (ref 0.35–5.50)

## 2011-05-15 LAB — AST: AST: 27 U/L (ref 0–37)

## 2011-05-15 MED ORDER — POTASSIUM CHLORIDE 10 MEQ PO TBCR: 10.0000 meq | EXTENDED_RELEASE_TABLET | Freq: Every day | ORAL | Status: DC

## 2011-05-15 MED ORDER — DILTIAZEM HCL ER COATED BEADS 240 MG PO CP24: ORAL_CAPSULE | ORAL | Status: DC

## 2011-05-15 NOTE — Assessment & Plan Note (Signed)
BP slightly elevated today. For now, no change in medicines. See instructions

## 2011-05-15 NOTE — Assessment & Plan Note (Signed)
Previously tried Viagra, then [employer]. Levitra is not working well. Samples of Staxyn 10 mg one hour prior to intercourse. Side effect profile discussed.

## 2011-05-15 NOTE — Assessment & Plan Note (Signed)
Persistent problem, if Flomax is not helping, we'll discontinue it. Was seen by urology in 2008, re-refer to urology

## 2011-05-15 NOTE — Assessment & Plan Note (Signed)
>>  ASSESSMENT AND PLAN FOR DYSLIPIDEMIA WRITTEN ON 05/15/2011  9:06 AM BY PAZ, JOSE E, MD  On diet only. Labs

## 2011-05-15 NOTE — Assessment & Plan Note (Addendum)
Ongoing problem, we'll check a potassium and Mg Reportedly, potassium 20 mEq twice a day helped. That is a very large dose. For now, I recommend potassium 10 mEq at bedtime. Recheck a potassium in one month.

## 2011-05-15 NOTE — Progress Notes (Signed)
  Subjective:    Patient ID: Darren Barnes, male    DOB: 1941/02/16, 70 y.o.   MRN: [id number]  HPI Here for Medicare AWV:  1. Risk factors based on Past M, S, F history: reviewed 2. Physical Activities:  Heavy yard work (has 3 acres), usually exercises daily (not lately d/t schedule) 3. Depression/mood:  No problems noted or reported  4. Hearing:  No problems noted or reported 5. ADL's:  Independent  6. Fall Risk: no recent falls  7. home Safety: does feelsafe at home  8. Height, weight, &visual acuity: see VS, vision corrected w/ glasses  9. Counseling: provided 10. Labs ordered based on risk factors: if needed  11. Referral Coordination: if needed 12.  Care Plan, see assessment and plan  13.   Cognitive Assessment: Doing extremely well from the cognitive and motor standpoint  In addition, today we discussed the following: Long history of leg cramps, he had potassium 20 mEq tablets, a left over, he took for a while one tablet twice a day and the cramps decreased. High chol-- on diet only  h/o nocturia , flomax helped initially, now back to go to urinate every hour at night. HTN-- on meds, good compliance , no amb BPs History of erectile dysfunction, Levitra 40 mg is not helping much   . Past Medical History  Diagnosis Date  . Diverticulosis of colon   . Hyperlipidemia   . Hypertension   . ED (erectile dysfunction)   . Thrombocytopenia     saw hematology, observe  . Osteoarthritis   . 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 No blood per rectum, no rectal pain or  mass. Denies unusual stress or anxiety. No dysuria, gross hematuria or difficulty urinating.    Objective:   Physical Exam  Constitutional: He is oriented to person, place, and time. He appears well-developed and well-nourished. No distress.  HENT:  Head: Normocephalic and atraumatic.  Neck: No tracheal deviation present. No thyromegaly present.       Normal carotid pulses  Cardiovascular: Normal rate, regular rhythm and normal heart sounds.   No murmur heard. Pulmonary/Chest: Effort normal and breath sounds normal. No respiratory distress. He has no wheezes. He has no rales.  Abdominal: Soft. Bowel sounds are normal. He exhibits no distension. There is no tenderness. There is no rebound.  Genitourinary: Rectum normal and prostate normal.       Has a perianal skin tag, benign to palpation  Musculoskeletal: He exhibits no edema.  Neurological: He is alert and oriented to person, place, and time.  Skin: He is not diaphoretic.  Psychiatric: He has a normal mood and affect. His behavior is normal. Judgment and thought content normal.          Assessment & Plan:  Today , I spent more than 25 min with the patient in addition to his CPX addressing a number of chronic problems and answering questions

## 2011-05-15 NOTE — Patient Instructions (Addendum)
Check the  blood pressure 2 or 3 times a week, be sure it is less than 140/85. If it is consistently higher, let me know. Your BP today was slightly elevated. Stop Flomax and Levitra Staxyn 10 mg one hour before intercourse. Side effects are similar to Levitra, if you have any severe side effects,discontinue it. Come back in one month for blood work only---->  BMP, DX leg cramps

## 2011-05-15 NOTE — Assessment & Plan Note (Signed)
On diet only Labs 

## 2011-05-15 NOTE — Assessment & Plan Note (Signed)
Td 05 pneumonia shot 2005 and  2010 shingles vaccine 2010  Cscope 6-08  (Dr Leone Payor) neg but had a polypoid anal lesion, lesion removed by Dr Lurene Shadow next Cscope-- 2018  Continue with a healthy lifestyle

## 2011-05-15 NOTE — Assessment & Plan Note (Signed)
Check a CBC

## 2011-05-18 ENCOUNTER — Telehealth: Payer: Self-pay | Admitting: *Deleted

## 2011-05-18 NOTE — Telephone Encounter (Signed)
Message left for patient to return my call.  

## 2011-05-18 NOTE — Telephone Encounter (Signed)
Message copied by Leanne Lovely on Fri May 18, 2011  9:59 AM ------      Message from: Willow Ora E      Created: Thu May 17, 2011  6:07 PM       Advise patient:      Cholesterol is satisfactory.      Blood counts show a low platelet count which is at baseline.      PSA, potassium and magnesium are normal, good results.      He complained of leg cramps at night, if a low potassium diet is not helping, we can call Flexeril 10 mg one by mouth each bedtime. #30, 2 refills

## 2011-05-22 NOTE — Telephone Encounter (Signed)
Message left for patient to return my call.  

## 2011-05-22 NOTE — Telephone Encounter (Signed)
Pt aware of labs  

## 2011-06-14 ENCOUNTER — Other Ambulatory Visit: Payer: Self-pay | Admitting: Internal Medicine

## 2011-06-14 DIAGNOSIS — R252 Cramp and spasm: Secondary | ICD-10-CM

## 2011-06-15 ENCOUNTER — Other Ambulatory Visit (INDEPENDENT_AMBULATORY_CARE_PROVIDER_SITE_OTHER): Payer: BC Managed Care – PPO

## 2011-06-15 DIAGNOSIS — R252 Cramp and spasm: Secondary | ICD-10-CM

## 2011-06-15 LAB — BASIC METABOLIC PANEL
Calcium: 9.3 mg/dL (ref 8.4–10.5)
Chloride: 105 mEq/L (ref 96–112)
Creatinine, Ser: 0.9 mg/dL (ref 0.4–1.5)
GFR: 102.63 mL/min (ref >60.00)

## 2011-06-15 NOTE — Progress Notes (Signed)
Labs only

## 2011-06-20 ENCOUNTER — Telehealth: Payer: Self-pay | Admitting: *Deleted

## 2011-06-20 NOTE — Telephone Encounter (Signed)
Message copied by Leanne Lovely on Wed Jun 20, 2011  1:30 PM ------      Message from: Darren Barnes      Created: Wed Jun 20, 2011  1:17 PM       Last month we decreased his potassium dose, this is a recheck. Advise patient his potassium remained normal. Good results

## 2011-06-20 NOTE — Telephone Encounter (Signed)
Pt aware of results 

## 2011-06-20 NOTE — Telephone Encounter (Signed)
Message left for patient to return my call.  

## 2011-08-05 ENCOUNTER — Other Ambulatory Visit: Payer: Self-pay | Admitting: Internal Medicine

## 2011-08-06 NOTE — Telephone Encounter (Signed)
Rx Done . 

## 2011-10-04 ENCOUNTER — Other Ambulatory Visit: Payer: Self-pay | Admitting: Internal Medicine

## 2011-10-05 LAB — POCT HEMOGLOBIN-HEMACUE
Hemoglobin: 18.3 — ABNORMAL HIGH
Operator id: 112821

## 2011-10-05 LAB — BASIC METABOLIC PANEL
CO2: 29
Chloride: 100
Creatinine, Ser: 1.13
GFR calc Af Amer: >60
Potassium: 4.5
Sodium: 136

## 2011-10-05 NOTE — Telephone Encounter (Signed)
Done

## 2011-11-13 ENCOUNTER — Ambulatory Visit (INDEPENDENT_AMBULATORY_CARE_PROVIDER_SITE_OTHER): Payer: Medicare Other | Admitting: Internal Medicine

## 2011-11-13 ENCOUNTER — Encounter: Payer: Self-pay | Admitting: Internal Medicine

## 2011-11-13 VITALS — BP 138/84 | HR 68 | Temp 98.4°F | Wt 208.6 lb

## 2011-11-13 DIAGNOSIS — I10 Essential (primary) hypertension: Secondary | ICD-10-CM

## 2011-11-13 DIAGNOSIS — R252 Cramp and spasm: Secondary | ICD-10-CM

## 2011-11-13 DIAGNOSIS — R351 Nocturia: Secondary | ICD-10-CM

## 2011-11-13 NOTE — Patient Instructions (Signed)
May like to try quinine OTC for cramps

## 2011-11-13 NOTE — Assessment & Plan Note (Signed)
Well-controlled, good medication compliance, no change

## 2011-11-13 NOTE — Assessment & Plan Note (Signed)
Was seen by urology,   after failed  2 other meds , he eventually settled on VESIcare.

## 2011-11-13 NOTE — Assessment & Plan Note (Signed)
Relatively well-controlled, thinks is better with potassium supplements. Recommend to try over-the-counter quinine if so desire

## 2011-11-13 NOTE — Progress Notes (Signed)
  Subjective:    Patient ID: Darren Barnes, male    DOB: [birth year], 70 y.o.   MRN: [id number]  HPI ROV Doing well  Past Medical History  Diagnosis Date  . Diverticulosis of colon   . Hyperlipidemia   . Hypertension   . ED (erectile dysfunction)   . Thrombocytopenia     saw hematology, observe  . Osteoarthritis   . Nocturia     saw urology 2012, was eval, rx vesicare (after several other meds failed)     Review of Systems History of leg cramps, decreased lately, but thinks potassium supplements help. History of nocturia, urologist note reviewed, after further evaluation and trial with a couple of meds  he eventually   decided to stay in [place], symptoms are reduced but not completely gone.    Objective:   Physical Exam  Constitutional: He is oriented to person, place, and time. He appears well-developed and well-nourished.  Cardiovascular: Normal rate, regular rhythm and normal heart sounds.   No murmur heard. Pulmonary/Chest: Effort normal and breath sounds normal. No respiratory distress. He has no wheezes. He has no rales.  Musculoskeletal: He exhibits no edema.  Neurological: He is alert and oriented to person, place, and time.          Assessment & Plan:  Had a flu shot already

## 2012-01-28 ENCOUNTER — Other Ambulatory Visit: Payer: Self-pay | Admitting: Internal Medicine

## 2012-01-28 NOTE — Telephone Encounter (Signed)
Refill done.  

## 2012-04-01 ENCOUNTER — Ambulatory Visit (INDEPENDENT_AMBULATORY_CARE_PROVIDER_SITE_OTHER): Payer: BC Managed Care – PPO | Admitting: Internal Medicine

## 2012-04-01 ENCOUNTER — Encounter: Payer: Self-pay | Admitting: Internal Medicine

## 2012-04-01 VITALS — BP 150/84 | HR 66 | Temp 98.0°F | Wt 212.0 lb

## 2012-04-01 DIAGNOSIS — M1990 Unspecified osteoarthritis, unspecified site: Secondary | ICD-10-CM

## 2012-04-01 NOTE — Assessment & Plan Note (Signed)
Left knee pain for 10 weeks, likely a DJD flareup. We discussed the options of treatment w/NSAIDs versus orthopedic referral. He prefers to orthopedic referral because 5 years ago, he had a similar problem and a local injection with steroids completely took care of the problem for years. Referral will be arranged. See instructions

## 2012-04-01 NOTE — Progress Notes (Signed)
  Subjective:    Patient ID: Darren Barnes, male    DOB: February 12, 1941, 71 y.o.   MRN: [id number]  HPI Acute visit 2.5 months  history of left knee pain on and off. Has taken Advil with some relief. No recent injury. The joint has not been swollen, red or hot.  Past Medical History  Diagnosis Date  . Diverticulosis of colon   . Hyperlipidemia   . Hypertension   . ED (erectile dysfunction)   . Thrombocytopenia     saw hematology, observe  . Osteoarthritis   . Nocturia     saw urology 2012, was eval, rx vesicare (after several other meds failed)    Review of Systems Denies fever or chills.    Objective:   Physical Exam A, ox3, no apparent distress. Lower extremities: No edema Knees are symmetric, some deformities consistent with DJD. No obvious effusion, redness or warmness.     Assessment & Plan:

## 2012-04-01 NOTE — Patient Instructions (Signed)
Ibuprofen 200 mg OTC 2 or 3 tabs  every 8 hours as need for pain, take it with food, watch for stomach irritation

## 2012-05-13 ENCOUNTER — Ambulatory Visit (INDEPENDENT_AMBULATORY_CARE_PROVIDER_SITE_OTHER): Payer: BC Managed Care – PPO | Admitting: Internal Medicine

## 2012-05-13 ENCOUNTER — Encounter: Payer: Self-pay | Admitting: Internal Medicine

## 2012-05-13 VITALS — BP 138/88 | HR 63 | Temp 97.9°F | Ht 67.75 in | Wt 176.0 lb

## 2012-05-13 DIAGNOSIS — I10 Essential (primary) hypertension: Secondary | ICD-10-CM

## 2012-05-13 DIAGNOSIS — R351 Nocturia: Secondary | ICD-10-CM

## 2012-05-13 DIAGNOSIS — D696 Thrombocytopenia, unspecified: Secondary | ICD-10-CM

## 2012-05-13 DIAGNOSIS — E785 Hyperlipidemia, unspecified: Secondary | ICD-10-CM

## 2012-05-13 DIAGNOSIS — Z0000 Encounter for general adult medical examination without abnormal findings: Secondary | ICD-10-CM

## 2012-05-13 LAB — CBC WITH DIFFERENTIAL/PLATELET
Basophils Relative: 0.3 % (ref 0.0–3.0)
Eosinophils Absolute: 0.1 10*3/uL (ref 0.0–0.7)
Lymphocytes Relative: 17.8 % (ref 12.0–46.0)
MCHC: 32.9 g/dL (ref 30.0–36.0)
Monocytes Relative: 11.4 % (ref 3.0–12.0)
Neutrophils Relative %: 69.2 % (ref 43.0–77.0)
RBC: 5.55 Mil/uL (ref 4.22–5.81)
WBC: 5.3 10*3/uL (ref 4.5–10.5)

## 2012-05-13 LAB — LIPID PANEL
Cholesterol: 209 mg/dL — ABNORMAL HIGH (ref 0–200)
Total CHOL/HDL Ratio: 4
Triglycerides: 73 mg/dL (ref 0.0–149.0)
VLDL: 14.6 mg/dL (ref 0.0–40.0)

## 2012-05-13 LAB — BASIC METABOLIC PANEL
BUN: 12 mg/dL (ref 6–23)
Chloride: 108 mEq/L (ref 96–112)
Potassium: 4.4 mEq/L (ref 3.5–5.1)

## 2012-05-13 MED ORDER — DILTIAZEM HCL ER COATED BEADS 240 MG PO CP24: 240.0000 mg | ORAL_CAPSULE | Freq: Two times a day (BID) | ORAL | Status: DC

## 2012-05-13 NOTE — Assessment & Plan Note (Addendum)
Long history of thrombocytopenia, saw hematology in 2008. They ordered a abdominal ultrasound which  showed fatty liver. Also additional blood work was prescribed---> B12, folic acid and serum electrophoresis, results? They also mention possibly calcium channel blockers as the culprit--- see  hypertension Plan: CBC

## 2012-05-13 NOTE — Assessment & Plan Note (Signed)
>>  ASSESSMENT AND PLAN FOR DYSLIPIDEMIA WRITTEN ON 05/13/2012  9:13 AM BY PAZ, JOSE E, MD  On lifestyle treatment. In the past he tried simvastatin without apparent side effects but preferred not to take medication. He does have a family history of heart disease.  Labs

## 2012-05-13 NOTE — Assessment & Plan Note (Signed)
Extensive urology evaluation, last visit 11/2011. They did urodynamics, he was diagnosed with BPH and detrusor instability. Failed to improve with several medications include enablex, toviaz and VESIcare which cause constipation without much improvement of his symptoms.

## 2012-05-13 NOTE — Assessment & Plan Note (Addendum)
Good ambulatory readings. In 2008 hematology considered CCBs as culprit for thrombocytopenia. We'll keep this in mind.

## 2012-05-13 NOTE — Assessment & Plan Note (Signed)
On lifestyle treatment. In the past he tried simvastatin without apparent side effects but preferred not to take medication. He does have a family history of heart disease.  Labs

## 2012-05-13 NOTE — Assessment & Plan Note (Signed)
Td 05 pneumonia shot 2005 and  2010 shingles vaccine 2010  Cscope 6-08  (Dr Gessner) neg but had a polypoid anal lesion, lesion removed by Dr Ballen next Cscope-- 2018  Continue with a healthy lifestyle 

## 2012-05-13 NOTE — Progress Notes (Signed)
  Subjective:    Patient ID: Darren Barnes, male    DOB: January 04, 1941, 71 y.o.   MRN: [id number]  HPI Here for a [facility] exam 1. Risk factors based on Past M, S, F history: reviewed 2. Physical Activities:  Heavy yard work (has 3 acres), usually exercises some days, somehow limited by knee pain  3. Depression/mood:  No problems noted or reported   4. Hearing:  No problems noted or reported 5. ADL's:  Independent   6. Fall Risk: no recent falls, prevention 7. home Safety: does feelsafe at home   8. Height, weight, &visual acuity: see VS, vision corrected w/ glasses   9. Counseling: provided 10. Labs ordered based on risk factors: if needed   11. Referral Coordination: if needed 12.  Care Plan, see assessment and plan   13.   Cognitive Assessment: Doing extremely well from the cognitive and motor standpoint  In addition, we discussed the following DJD, having problems with the left knee, saw the  orthopedic doctor , had a shot w/ some relief. Hypertension, good medication compliance, ambulatory BPs usually well-controlled with a diastolic of around 65. BP today slightly elevated Nocturia, status post extensive evaluation by urology, they prescribe VESIcare which helped little but  cause constipation consequently he discontinued it.  Past Medical History  Diagnosis Date  . Hyperlipidemia   . Hypertension   . ED (erectile dysfunction)   . Thrombocytopenia     saw hematology, observe  . Osteoarthritis   . Nocturia     saw urology 2012, was eval, rx vesicare (after several other meds failed)   Past Surgical History  Procedure Date  . Benign tumor from left arm   . Sinus surgery for benign tumor   . Knee arthroscopy 2004  . 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     Objective:   Physical Exam  General -- alert, well-developed, and well-nourished.   Neck --no thyromegaly , normal carotid pulse Lungs -- normal respiratory effort, no intercostal retractions, no accessory muscle use, and normal breath sounds.   Heart-- normal rate, regular rhythm, no murmur, and no gallop.   Abdomen--soft, non-tender, no distention, no masses, no HSM, no guarding, and no rigidity.   Extremities-- no pretibial edema bilaterally Neurologic-- alert & oriented X3 and strength normal in all extremities. Psych-- Cognition and judgment appear intact. Alert and cooperative with normal attention span and concentration.  not anxious appearing and not depressed appearing.        Assessment & Plan:

## 2012-05-14 ENCOUNTER — Encounter: Payer: Self-pay | Admitting: Internal Medicine

## 2012-05-16 MED ORDER — ATORVASTATIN CALCIUM 10 MG PO TABS: 10.0000 mg | ORAL_TABLET | Freq: Every day | ORAL | Status: DC

## 2012-05-16 NOTE — Progress Notes (Signed)
Addended by: Edwena Felty T on: 05/16/2012 04:27 PM   Modules accepted: Orders

## 2012-07-04 ENCOUNTER — Other Ambulatory Visit (INDEPENDENT_AMBULATORY_CARE_PROVIDER_SITE_OTHER): Payer: BC Managed Care – PPO

## 2012-07-04 DIAGNOSIS — E785 Hyperlipidemia, unspecified: Secondary | ICD-10-CM

## 2012-07-04 LAB — LIPID PANEL: Total CHOL/HDL Ratio: 3

## 2012-07-04 LAB — AST: AST: 25 U/L (ref 0–37)

## 2012-07-04 NOTE — Progress Notes (Signed)
Labs only

## 2012-07-08 ENCOUNTER — Other Ambulatory Visit: Payer: Self-pay | Admitting: *Deleted

## 2012-07-08 MED ORDER — ATORVASTATIN CALCIUM 10 MG PO TABS: 10.0000 mg | ORAL_TABLET | Freq: Every day | ORAL | Status: DC

## 2012-10-27 ENCOUNTER — Ambulatory Visit (INDEPENDENT_AMBULATORY_CARE_PROVIDER_SITE_OTHER): Payer: BC Managed Care – PPO | Admitting: Internal Medicine

## 2012-10-27 ENCOUNTER — Encounter: Payer: Self-pay | Admitting: Internal Medicine

## 2012-10-27 VITALS — BP 159/82 | HR 67 | Temp 98.4°F | Wt 213.0 lb

## 2012-10-27 DIAGNOSIS — I10 Essential (primary) hypertension: Secondary | ICD-10-CM

## 2012-10-27 DIAGNOSIS — K649 Unspecified hemorrhoids: Secondary | ICD-10-CM | POA: Insufficient documentation

## 2012-10-27 DIAGNOSIS — M549 Dorsalgia, unspecified: Secondary | ICD-10-CM

## 2012-10-27 MED ORDER — HYDROCORTISONE ACETATE 25 MG RE SUPP: 25.0000 mg | Freq: Two times a day (BID) | RECTAL | Status: DC | PRN

## 2012-10-27 NOTE — Progress Notes (Signed)
  Subjective:    Patient ID: Darren Barnes, male    DOB: [birth year], 71 y.o.   MRN: [id number]  HPI Acute visit Saw red  blood in his underwear yesterday from the rectum. Concerned about it. He also has a mild ache at the upper lumbar spine, is worse when he lays down onto a hard surface, no radiation.  Past Medical History  Diagnosis Date  . Hyperlipidemia   . Hypertension   . ED (erectile dysfunction)   . Thrombocytopenia     saw hematology, observe  . Osteoarthritis   . Nocturia     saw urology 2012, was eval, rx vesicare (after several other meds failed)   Past Surgical History  Procedure Date  . Benign tumor from left arm   . Sinus surgery for benign tumor   . Knee arthroscopy 2004  . 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 No weight loss No rectal pain or itching. BP is noted to be slightly elevated, no recent ambulatory BPs    Objective:   Physical Exam General -- alert, well-developed, and overweight appearing. No apparent distress.   Abdomen--soft, non-tender, no distention, no masses, no HSM, no guarding, and no rigidity.   Back-- no tender to palpation DRE:  Single external very small hemorrhoid. No mass felt, brown stools. Anoscopy several small hemorrhoids,  one of them looks slightly red but no active bleeding. No polyps observed Neurologic-- alert & oriented X3 and strength normal in all extremities. Psych-- Cognition and judgment appear intact. Alert and cooperative with normal attention span and concentration.  not anxious appearing and not depressed  appearing.      Assessment & Plan:   Back pain, Pain whenever he lays down in the heart shows use, will do an x-ray to make sure he does not have a lesion. If pain continues will need further evaluation.

## 2012-10-27 NOTE — Assessment & Plan Note (Signed)
Blood per rectum, Most likely due to internal hemorrhoids, see instructions

## 2012-10-27 NOTE — Assessment & Plan Note (Signed)
BP slightly elevated today, recommend to monitor his BP, see instructions

## 2012-10-27 NOTE — Patient Instructions (Addendum)
Please get your x-ray at the other [place]  office located at: [address] [place], across from [facility].  Please go to the basement, this is a walk-in facility, they are open from 8:30 to 5:30 PM. Phone number [phone]. ----- Uses suppositories twice a day for 2 days, then as needed. Drink plenty of fluids, eat lots of fruits and vegetables. If you frequent or severe  Bleeding please let us know. --- Check the  blood pressure 2 or 3 times a week, be sure it is between 110/60 and 140/85. If it is consistently higher or lower, let me know

## 2012-10-28 ENCOUNTER — Ambulatory Visit (INDEPENDENT_AMBULATORY_CARE_PROVIDER_SITE_OTHER)
Admission: RE | Admit: 2012-10-28 | Discharge: 2012-10-28 | Disposition: A | Discharge status: Home / Self Care | Payer: BC Managed Care – PPO | Source: Ambulatory Visit | Attending: Internal Medicine | Admitting: Internal Medicine

## 2012-10-28 DIAGNOSIS — M549 Dorsalgia, unspecified: Secondary | ICD-10-CM

## 2012-10-31 ENCOUNTER — Encounter: Payer: Self-pay | Admitting: *Deleted

## 2012-11-13 ENCOUNTER — Ambulatory Visit (INDEPENDENT_AMBULATORY_CARE_PROVIDER_SITE_OTHER): Payer: BC Managed Care – PPO | Admitting: Internal Medicine

## 2012-11-13 ENCOUNTER — Encounter: Payer: Self-pay | Admitting: Internal Medicine

## 2012-11-13 VITALS — BP 144/82 | HR 62 | Temp 98.0°F | Wt 212.0 lb

## 2012-11-13 DIAGNOSIS — G4733 Obstructive sleep apnea (adult) (pediatric): Secondary | ICD-10-CM | POA: Insufficient documentation

## 2012-11-13 DIAGNOSIS — R351 Nocturia: Secondary | ICD-10-CM

## 2012-11-13 DIAGNOSIS — R252 Cramp and spasm: Secondary | ICD-10-CM

## 2012-11-13 DIAGNOSIS — I739 Peripheral vascular disease, unspecified: Secondary | ICD-10-CM

## 2012-11-13 DIAGNOSIS — R0683 Snoring: Secondary | ICD-10-CM

## 2012-11-13 DIAGNOSIS — R0609 Other forms of dyspnea: Secondary | ICD-10-CM

## 2012-11-13 DIAGNOSIS — Z79899 Other long term (current) drug therapy: Secondary | ICD-10-CM

## 2012-11-13 NOTE — Assessment & Plan Note (Addendum)
Long history of snoring, he feels un rested  in the morning.no sleepy through out the day.  Early-morning fatigue may be from snoring or nocturia, I'm not completely sure. At this point, snoring seem  to be significant, will do a  apnea-lynk study

## 2012-11-13 NOTE — Assessment & Plan Note (Addendum)
See history of present illness, symptoms do suggest claudication but  vascular physical exam is normal. He is taking statins, he has no pain at rest. Etiology not clear. Plan: ABIs? Declined, will consider in the future Check vit d, B12, CKs May like to try OTC K before walks

## 2012-11-13 NOTE — Assessment & Plan Note (Addendum)
Continue with nocturia, last visit with urology 11/2011, note reviewed, has either have side effects or gotten not help from [place], [place], Myrbitriq. Offered a sleeping pill, declined

## 2012-11-13 NOTE — Assessment & Plan Note (Signed)
Currently having them rarely, not on K supplements

## 2012-11-13 NOTE — Progress Notes (Signed)
  Subjective:    Patient ID: Darren Barnes, male    DOB: 1941/08/14, 71 y.o.   MRN: [id number]  HPI  Routine office visit --Was seen with blood per rectum recently--->  no further symptoms. --Today he complains of leg pain after walking 1 mile, symptoms going on for about a year, decrease with rest. Pain is in both legs, not actual hip or knee pain, the dyscomfort is actually in the muscles. After the pain starts he is able to keep walking 1 or 2 more miles then he stops. --Patient's wife concerned about his snoring, he's not sleepy throughout the day but he's not rested in the morning, at the same time she recognizes that is unable to sleep due to the nocturia. See assessment and plan.  Past Medical History  Diagnosis Date  . Hyperlipidemia   . Hypertension   . ED (erectile dysfunction)   . Thrombocytopenia     saw hematology, observe  . Osteoarthritis   . Nocturia     saw urology 2012, was eval, rx vesicare (after several other meds failed)   Past Surgical History  Procedure Date  . Benign tumor from left arm   . Sinus surgery for benign tumor   . Knee arthroscopy 2004  . Anal polyp resection 2008    Dr Lisabeth Devoid     Review of Systems No chest or shortness of breath No nausea, vomiting, diarrhea. No dysuria or gross hematuria. Good compliance with cholesterol and BP meds    Objective:   Physical Exam  General -- alert, well-developed. No apparent distress.  Lungs -- normal respiratory effort, no intercostal retractions, no accessory muscle use, and normal breath sounds.   Heart-- normal rate, regular rhythm, no murmur, and no gallop.    Extremities-- no pretibial edema bilaterally, normal femoral and pedal pulses B  Psych-- Cognition and judgment appear intact. Alert and cooperative with normal attention span and concentration.  not anxious appearing and not depressed appearing.      Assessment & Plan:

## 2012-11-19 LAB — VITAMIN D 1,25 DIHYDROXY
Vitamin D 1, 25 (OH)2 Total: 60 pg/mL (ref 18–72)
Vitamin D3 1, 25 (OH)2: 60 pg/mL

## 2012-11-21 ENCOUNTER — Telehealth: Payer: Self-pay | Admitting: Internal Medicine

## 2012-11-21 DIAGNOSIS — R252 Cramp and spasm: Secondary | ICD-10-CM

## 2012-11-21 DIAGNOSIS — R0683 Snoring: Secondary | ICD-10-CM

## 2012-11-21 DIAGNOSIS — G4730 Sleep apnea, unspecified: Secondary | ICD-10-CM

## 2012-11-21 DIAGNOSIS — I739 Peripheral vascular disease, unspecified: Secondary | ICD-10-CM

## 2012-11-21 NOTE — Telephone Encounter (Signed)
Patient now agrees "circulation test". Insurance declined  apnea lynk. 1. He is arrange ABIs, DX claudication 2. Arrange a referral to pulmonary, DX snoring, suspect sleep apnea

## 2012-11-21 NOTE — Telephone Encounter (Signed)
Discussed with pt, entered orders.  

## 2012-11-24 NOTE — Addendum Note (Signed)
Addended by: Edwena Felty T on: 11/24/2012 09:57 AM   Modules accepted: Orders

## 2012-11-28 ENCOUNTER — Encounter (INDEPENDENT_AMBULATORY_CARE_PROVIDER_SITE_OTHER): Payer: BC Managed Care – PPO

## 2012-11-28 DIAGNOSIS — R252 Cramp and spasm: Secondary | ICD-10-CM

## 2012-11-28 DIAGNOSIS — I70219 Atherosclerosis of native arteries of extremities with intermittent claudication, unspecified extremity: Secondary | ICD-10-CM

## 2012-12-05 ENCOUNTER — Ambulatory Visit (INDEPENDENT_AMBULATORY_CARE_PROVIDER_SITE_OTHER): Payer: BC Managed Care – PPO | Admitting: Pulmonary Disease

## 2012-12-05 ENCOUNTER — Encounter: Payer: Self-pay | Admitting: Pulmonary Disease

## 2012-12-05 VITALS — BP 142/76 | HR 61 | Temp 99.1°F | Ht 69.0 in | Wt 215.2 lb

## 2012-12-05 DIAGNOSIS — R0683 Snoring: Secondary | ICD-10-CM

## 2012-12-05 DIAGNOSIS — R0609 Other forms of dyspnea: Secondary | ICD-10-CM

## 2012-12-05 DIAGNOSIS — R0989 Other specified symptoms and signs involving the circulatory and respiratory systems: Secondary | ICD-10-CM

## 2012-12-05 NOTE — Progress Notes (Signed)
  Subjective:    Patient ID: Darren Barnes, male    DOB: 22-Aug-1941, 71 y.o.   MRN: [id number]  HPI 71 year old African American gentleman who presents for evaluation of obstructive sleep apnea. He retired as was present in the [employer] in August 2013. Epworth sleepiness score is 12/24. Wife has noted a severe snoring problem for many years and now sleeps in a different bedroom. He reports nocturia and has undergone evaluation by urology. He reports non-refreshing sleep. He reports falling asleep watching TV or sitting and reading. He has 2 drinks every evening including a scotch close to bedtime. Bedtime is 9 PM , sleep latency is minimal , he sleeps on his side with one pillow. He is 3-4 awakenings for nocturia and is out of bed at 6 AM feeling tired with dryness of mouth but denies headaches. He is gained 10 pounds over the last 2 years. He drinks 1-2 cups of coffee in the morning and denies excessive use of caffeinated beverages . There is no history suggestive of cataplexy, sleep paralysis or parasomnias   Past Medical History  Diagnosis Date  . Hyperlipidemia   . Hypertension   . ED (erectile dysfunction)   . Thrombocytopenia     saw hematology, observe  . Osteoarthritis   . Nocturia     saw urology 2012, was eval, rx vesicare (after several other meds failed)    Past Surgical History  Procedure Date  . Benign tumor from left arm   . Sinus surgery for benign tumor   . Knee arthroscopy 2004  . Anal polyp resection 2008    Dr Lisabeth Devoid    No Known Allergies  History   Social History  . Marital Status: Married    Spouse Name: N/A    Number of Children: 3  . Years of Education: N/A   Occupational History  . 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  [employer]   Social History Main Topics  . Smoking status: Never Smoker   . Smokeless tobacco: Not on file  . Alcohol Use: Yes     Comment: 2 per day  . Drug Use: No  . Sexually Active: Not on file   Other Topics  Concern  . Not on file   Social History Narrative   Exercise- very active in the yard     Review of Systems  Constitutional: Negative for appetite change and unexpected weight change.  HENT: Negative for ear pain, congestion, sore throat, sneezing, trouble swallowing and dental problem.   Respiratory: Negative for cough and shortness of breath.   Cardiovascular: Negative for chest pain, palpitations and leg swelling.  Gastrointestinal: Negative for abdominal pain.  Musculoskeletal: Negative for joint swelling.  Skin: Negative for rash.  Neurological: Negative for headaches.  Psychiatric/Behavioral: Negative for dysphoric mood. The patient is not nervous/anxious.        Objective:   Physical Exam  Gen. Pleasant, well-nourished, in no distress, normal affect ENT - no lesions, no post nasal drip Neck: No JVD, no thyromegaly, no carotid bruits Lungs: no use of accessory muscles, no dullness to percussion, clear without rales or rhonchi  Cardiovascular: Rhythm regular, heart sounds  normal, no murmurs or gallops, no peripheral edema Abdomen: soft and non-tender, no hepatosplenomegaly, BS normal. Musculoskeletal: No deformities, no cyanosis or clubbing Neuro:  alert, non focal       Assessment & Plan:

## 2012-12-05 NOTE — Assessment & Plan Note (Signed)
Given excessive daytime somnolence, narrow pharyngeal exam, witnessed apneas & loud snoring, obstructive sleep apnea is very likely & an overnight polysomnogram will be scheduled as a split study. The pathophysiology of obstructive sleep apnea , it's cardiovascular consequences & modes of treatment including CPAP and oral appliance were discused with the patient in detail & they evidenced understanding.

## 2012-12-05 NOTE — Patient Instructions (Signed)
Home sleep study - we discussed alternatives

## 2012-12-18 ENCOUNTER — Telehealth: Payer: Self-pay | Admitting: Pulmonary Disease

## 2012-12-18 DIAGNOSIS — R0683 Snoring: Secondary | ICD-10-CM

## 2012-12-19 NOTE — Telephone Encounter (Signed)
Order was sent to [facility] for PSG

## 2013-01-15 ENCOUNTER — Ambulatory Visit (HOSPITAL_BASED_OUTPATIENT_CLINIC_OR_DEPARTMENT_OTHER): Payer: Medicare Other | Attending: Pulmonary Disease | Admitting: Radiology

## 2013-01-15 VITALS — Ht 69.0 in | Wt 205.0 lb

## 2013-01-15 DIAGNOSIS — G4733 Obstructive sleep apnea (adult) (pediatric): Secondary | ICD-10-CM | POA: Insufficient documentation

## 2013-01-15 DIAGNOSIS — R0683 Snoring: Secondary | ICD-10-CM

## 2013-01-29 ENCOUNTER — Telehealth: Payer: Self-pay | Admitting: Pulmonary Disease

## 2013-01-29 DIAGNOSIS — G4730 Sleep apnea, unspecified: Secondary | ICD-10-CM

## 2013-01-29 DIAGNOSIS — G4710 Hypersomnia, unspecified: Secondary | ICD-10-CM

## 2013-01-29 NOTE — Telephone Encounter (Signed)
Needs FU appt to discuss PSG

## 2013-01-29 NOTE — Procedures (Signed)
NAMEOWAIS, Darren Barnes Darren.:  [id number]  MEDICAL RECORD Darren.:  [id number]          PATIENT TYPE:  OUT  LOCATION:  SLEEP CENTER                 FACILITY:  [facility]  PHYSICIAN:  Oretha Milch, MD      DATE OF BIRTH:  06/30/1941  DATE OF STUDY:  01/15/2013                           NOCTURNAL POLYSOMNOGRAM  REFERRING PHYSICIAN:  Oretha Milch, MD  INDICATION FOR STUDY:  This patient is a 72 year old gentleman with loud snoring and excessive daytime somnolence.  At the time of this study, he weighed 205 pounds with a height of 5 feet 9 inches, BMI of 30, neck size of 15.5 inches.  EPWORTH SLEEPINESS SCORE:  12.  MEDICATIONS:  Bedtime medications none.  This nocturnal polysomnogram was performed with sleep technologist in attendance.  EEG, EOG, EMG, EKG, and respiratory parameters were recorded.  Sleep stages, arousals, limb movements, and respiratory data were scored according to criteria laid out by the American Academy of Sleep Medicine.  SLEEP ARCHITECTURE:  Lights out was at 10:22 p.m. lights on was 4:35 a.m. total sleep time was 266 minutes with a sleep period time of 362 minutes and sleep efficiency of 71%.  Sleep latency was 8 minutes and latency to REM sleep was 88 minutes and wake after sleep onset was 98 minutes.  Sleep stages of the percentage of total sleep time was N1 of 10%, N2 of 69%, N3 of 3.6%, and REM sleep 17% (47 minutes).  REM sleep was noted in 2 stages around midnight and 4:00 a.m.  Supine sleep accounted for 147 minutes.  There was 33 minutes of supine REM sleep.  AROUSAL DATA:  There were 65 arousals with an arousal index of 15 events per hour.  Of these, 50 were spontaneous and the rest were associated with respiratory events.  RESPIRATORY DATA:  There were 13 obstructive apneas, 1 central apnea, 0 mixed apneas, and 21 hypopneas with apnea-hypopnea index of 8 events per hour.  There were 26 RERAs with an RDI of 7 events per hour.   Longest apnea was 40 seconds and longest hypopnea was 78% during REM sleep.  LIMB MOVEMENT DATA:  The limb movement index was 17 events per hour. The limb movement arousal index was only 1.4 events per hour.  OXYGEN SATURATION DATA:  Desaturation index was 13 events per hour.  The lowest desaturation was 83% during REM sleep.  Of note, he spent 1.4 minutes with a saturation less than 88%.  CARDIAC DATA:  The low heart rate was 46 beats per minute.  The high heart rate recorded with an artifact.  Occasional PVCs were noted.  DISCUSSION:  He was desensitized with a full face mask, but did not meet criteria for CPAP intervention.  Moderate snoring was noted.  IMPRESSION: 1. Mild obstructive sleep apnea with predominant hypopneas during REM     sleep causing mild desaturation and sleep fragmentation. 2. Darren evidence of cardiac arrhythmias or behavioral disturbance during     sleep. 3. Few periodic limb movements were noted, but these mode Darren mostly     not associated with arousals.  Significance of this is unclear.  RECOMMENDATION: 1. The  treatment options for this degree of sleep-disordered breathing     include weight loss, CPAP therapy, or dental appliance.     Alternatively, Darren therapy can also be offered if he has a good     cardiovascular profile. 2. He should be cautioned against medications with sedative side     effects.  He should be asked to avoid driving when sleepy.     Oretha Milch, MD    RVA/MEDQ  D:  01/29/2013 10:16:52  T:  01/29/2013 16:10:96  Job:  [id number]

## 2013-02-04 NOTE — Telephone Encounter (Signed)
I spoke with pt and is scheduled to come in 03/02/13 at 4:15. Nothing further was needed

## 2013-02-06 ENCOUNTER — Other Ambulatory Visit: Payer: Self-pay | Admitting: Internal Medicine

## 2013-03-02 ENCOUNTER — Ambulatory Visit (INDEPENDENT_AMBULATORY_CARE_PROVIDER_SITE_OTHER): Payer: Medicare Other | Admitting: Pulmonary Disease

## 2013-03-02 ENCOUNTER — Encounter: Payer: Self-pay | Admitting: Pulmonary Disease

## 2013-03-02 VITALS — BP 138/70 | HR 75 | Temp 98.3°F | Ht 69.0 in | Wt 223.0 lb

## 2013-03-02 DIAGNOSIS — G4733 Obstructive sleep apnea (adult) (pediatric): Secondary | ICD-10-CM

## 2013-03-02 NOTE — Progress Notes (Signed)
  Subjective:    Patient ID: Darren Barnes, male    DOB: 06-25-1941, 72 y.o.   MRN: [id number]  HPI 72 year old African American gentleman who presents for FU of obstructive sleep apnea.  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  Epworth sleepiness score is 12/24. Wife has noted a severe snoring problem for many years and now sleeps in a different bedroom. He reports nocturia and has undergone evaluation by urology. He reports non-refreshing sleep. He reports falling asleep watching TV or sitting and reading.  He has 2 drinks every evening including a scotch close to bedtime. Bedtime is 9 PM , sleep latency is minimal , he sleeps on his side with one pillow. He is 3-4 awakenings for nocturia and is out of bed at 6 AM feeling tired with dryness of mouth but denies headaches.  He is gained 10 pounds over the last 2 years. He drinks 1-2 cups of coffee in the morning and denies excessive use of caffeinated beverages   There were 13 obstructive apneas, 1 central apnea, 0 mixed apneas, and 21 hypopneas with apnea-hypopnea index of 8 events per hour. There were 26 RERAs with an RDI of 7 events per hour. Longest apnea was 40 seconds and longest hypopnea was 78% during REM sleep.   Past Medical History  Diagnosis Date  . Hyperlipidemia   . Hypertension   . ED (erectile dysfunction)   . Thrombocytopenia     saw hematology, observe  . Osteoarthritis   . Nocturia     saw urology 2012, was eval, rx vesicare (after several other meds failed)     Review of Systems neg for any significant sore throat, dysphagia, itching, sneezing, nasal congestion or excess/ purulent secretions, fever, chills, sweats, unintended wt loss, pleuritic or exertional cp, hempoptysis, orthopnea pnd or change in chronic leg swelling. Also denies presyncope, palpitations, heartburn, abdominal pain, nausea, vomiting, diarrhea or change in bowel or urinary habits, dysuria,hematuria, rash, arthralgias, visual complaints,  headache, numbness weakness or ataxia.     Objective:   Physical Exam  Gen. Pleasant, well-nourished, in no distress ENT - no lesions, no post nasal drip Neck: No JVD, no thyromegaly, no carotid bruits Lungs: no use of accessory muscles, no dullness to percussion, clear without rales or rhonchi  Cardiovascular: Rhythm regular, heart sounds  normal, no murmurs or gallops, no peripheral edema Musculoskeletal: No deformities, no cyanosis or clubbing         Assessment & Plan:

## 2013-03-02 NOTE — Patient Instructions (Signed)
We will set you up with a cpap machine with nasal pillows Send in the card before your next appt

## 2013-03-02 NOTE — Assessment & Plan Note (Addendum)
Trial of autoCPAP 5-10 cm, nasla pillows,humidity, download in 4 weeks  Objective here would be to improve his daytime fatigue & nocturia. His cardiac risk profile is acceptable  Weight loss encouraged, compliance with goal of at least 4-6 hrs every night is the expectation. Advised against medications with sedative side effects Cautioned against driving when sleepy - understanding that sleepiness will vary on a day to day basis

## 2013-04-14 ENCOUNTER — Encounter: Payer: Self-pay | Admitting: Pulmonary Disease

## 2013-04-14 ENCOUNTER — Ambulatory Visit (INDEPENDENT_AMBULATORY_CARE_PROVIDER_SITE_OTHER): Payer: Medicare Other | Admitting: Pulmonary Disease

## 2013-04-14 VITALS — BP 158/62 | HR 67 | Temp 97.8°F | Ht 69.0 in | Wt 222.2 lb

## 2013-04-14 DIAGNOSIS — G4733 Obstructive sleep apnea (adult) (pediatric): Secondary | ICD-10-CM

## 2013-04-14 NOTE — Assessment & Plan Note (Signed)
We will set CPAP to 10 cm Good results with CPAP, good compliance - decreased fatigue, no snoring Download in 1 mnth on fixed pr  Weight loss encouraged, compliance with goal of at least 4-6 hrs every night is the expectation. Advised against medications with sedative side effects Cautioned against driving when sleepy - understanding that sleepiness will vary on a day to day basis

## 2013-04-14 NOTE — Patient Instructions (Signed)
We will set CPAP to 10 cm Good results with CPAP

## 2013-04-14 NOTE — Progress Notes (Signed)
  Subjective:    Patient ID: Darren Barnes, male    DOB: 06-14-1941, 72 y.o.   MRN: [id number]  HPI 72 year old African American gentleman who presents for FU of obstructive sleep apnea.  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  Epworth sleepiness score is 12/24. Wife has noted a severe snoring problem for many years and now sleeps in a different bedroom. He reports nocturia and has undergone evaluation by urology. He reports non-refreshing sleep. He reports falling asleep watching TV or sitting and reading.  He has 2 drinks every evening including a scotch close to bedtime. Bedtime is 9 PM , sleep latency is minimal , he sleeps on his side with one pillow. He is 3-4 awakenings for nocturia and is out of bed at 6 AM feeling tired with dryness of mouth but denies headaches.  He is gained 10 pounds over the last 2 years. He drinks 1-2 cups of coffee in the morning and denies excessive use of caffeinated beverages  There were 13 obstructive apneas, 1 central apnea, 0 mixed apneas, and 21 hypopneas with apnea-hypopnea index of 8 events per hour. There were 26 RERAs with an RDI of 7 events per hour. Longest apnea was 40 seconds and longest hypopnea was 78% during REM sleep.   04/14/2013  Pt states he wears his CPAP everynight x 4-6 hrs a night. denies any problems w/ mask/machine. feels rested during the day for the most part.  Download 03/2013 shows good usage, avg pr 10 cm, ,no residuals, no leak Wife is happy, no snoring Nocturia persists, wakes up rested - tolerating well overall   Review of Systems neg for any significant sore throat, dysphagia, itching, sneezing, nasal congestion or excess/ purulent secretions, fever, chills, sweats, unintended wt loss, pleuritic or exertional cp, hempoptysis, orthopnea pnd or change in chronic leg swelling. Also denies presyncope, palpitations, heartburn, abdominal pain, nausea, vomiting, diarrhea or change in bowel or urinary habits,  dysuria,hematuria, rash, arthralgias, visual complaints, headache, numbness weakness or ataxia.     Objective:   Physical Exam  Gen. Pleasant, well-nourished, in no distress ENT - no lesions, no post nasal drip Neck: No JVD, no thyromegaly, no carotid bruits Lungs: no use of accessory muscles, no dullness to percussion, clear without rales or rhonchi  Cardiovascular: Rhythm regular, heart sounds  normal, no murmurs or gallops, no peripheral edema Musculoskeletal: No deformities, no cyanosis or clubbing        Assessment & Plan:

## 2013-05-14 ENCOUNTER — Encounter: Payer: Self-pay | Admitting: Internal Medicine

## 2013-05-14 ENCOUNTER — Ambulatory Visit (INDEPENDENT_AMBULATORY_CARE_PROVIDER_SITE_OTHER): Payer: Medicare Other | Admitting: Internal Medicine

## 2013-05-14 VITALS — BP 130/84 | HR 62 | Temp 98.0°F | Ht 68.0 in | Wt 218.0 lb

## 2013-05-14 DIAGNOSIS — Z125 Encounter for screening for malignant neoplasm of prostate: Secondary | ICD-10-CM

## 2013-05-14 DIAGNOSIS — Z0000 Encounter for general adult medical examination without abnormal findings: Secondary | ICD-10-CM

## 2013-05-14 DIAGNOSIS — I739 Peripheral vascular disease, unspecified: Secondary | ICD-10-CM

## 2013-05-14 DIAGNOSIS — I10 Essential (primary) hypertension: Secondary | ICD-10-CM

## 2013-05-14 DIAGNOSIS — E785 Hyperlipidemia, unspecified: Secondary | ICD-10-CM

## 2013-05-14 DIAGNOSIS — R351 Nocturia: Secondary | ICD-10-CM

## 2013-05-14 DIAGNOSIS — D696 Thrombocytopenia, unspecified: Secondary | ICD-10-CM

## 2013-05-14 LAB — LIPID PANEL
Cholesterol: 204 mg/dL — ABNORMAL HIGH (ref 0–200)
HDL: 53.1 mg/dL (ref >39.00)
Triglycerides: 86 mg/dL (ref 0.0–149.0)
VLDL: 17.2 mg/dL (ref 0.0–40.0)

## 2013-05-14 LAB — CBC WITH DIFFERENTIAL/PLATELET
Basophils Absolute: 0 10*3/uL (ref 0.0–0.1)
Basophils Relative: 0.3 % (ref 0.0–3.0)
Eosinophils Absolute: 0.1 10*3/uL (ref 0.0–0.7)
Lymphocytes Relative: 21.8 % (ref 12.0–46.0)
MCHC: 33.8 g/dL (ref 30.0–36.0)
MCV: 83.7 fl (ref 78.0–100.0)
Monocytes Absolute: 0.7 10*3/uL (ref 0.1–1.0)
Neutrophils Relative %: 62.5 % (ref 43.0–77.0)
RBC: 5.56 Mil/uL (ref 4.22–5.81)
RDW: 13.5 % (ref 11.5–14.6)

## 2013-05-14 LAB — BASIC METABOLIC PANEL
Calcium: 9.2 mg/dL (ref 8.4–10.5)
GFR: 90.74 mL/min (ref >60.00)
Glucose, Bld: 93 mg/dL (ref 70–99)

## 2013-05-14 LAB — LDL CHOLESTEROL, DIRECT: Direct LDL: 129.5 mg/dL

## 2013-05-14 NOTE — Progress Notes (Signed)
Subjective:    Patient ID: Darren Barnes, male    DOB: 06-Sep-1941, 72 y.o.   MRN: [id number]  HPI Here for a [facility] exam  1. Risk factors based on Past M, S, F history: reviewed  2. Physical Activities: Heavy yard work (has 3 acres), usually exercises some days, somehow limited by knee pain  3. Depression/mood: (-) screening , see a/p 4. Hearing: No problems noted or reported (wife thinks so, pt declined a referral) 5. ADL's: Independent  6. Fall Risk: no recent falls, prevention discussed 7. home Safety: does feelsafe at home  8. Height, weight, &visual acuity: see VS, vision corrected w/ glasses , see eye doctor (has an appointment next week) 9. Counseling: provided  10. Labs ordered based on risk factors: if needed  11. Referral Coordination: if needed  12. Care Plan, see assessment and plan  13. Cognitive Assessment: Doing well from the cognitive and motor standpoint   In addition, we discussed the following Hypertension, good compliance with medications, BP today very good. Since the last physical exam, she was diagnosed with sleep apnea, tolerating a CPAP well. Nocturia, still an issue, see assessment and plan.  Past Medical History  Diagnosis Date  . Hyperlipidemia   . Hypertension   . ED (erectile dysfunction)   . Thrombocytopenia     saw hematology, observe  . Osteoarthritis   . Nocturia     saw urology 2012, was eval, rx vesicare (after several other meds failed)  . OSA on CPAP    Past Surgical History  Procedure Laterality Date  . Benign tumor from left arm    . Sinus surgery for benign tumor    . Knee arthroscopy  2004  . Anal polyp resection  2008    Dr Lisabeth Devoid   History   Social History  . Marital Status: Married    Spouse Name: N/A    Number of Children: 3  . Years of Education: N/A   Occupational History  . Retired [employer]     retired   Social History Main Topics  . Smoking status: Never Smoker   . Smokeless tobacco: Never Used  . Alcohol  Use: Yes     Comment: 2 per day on average  . Drug Use: No  . Sexually Active: Not on file   Other Topics Concern  . Not on file   Social History Narrative   Lost one son, 2 living children            Family History  Problem Relation Age of Onset  . Coronary artery disease Father     MI at 80  . Hypertension Mother     Judie Petit and F   . Diabetes Mother   . Stroke Mother 43  . Colon cancer Neg Hx   . Prostate cancer Neg Hx      Review of Systems No chest pain or shortness or breath. No nausea, vomiting, diarrhea or blood in the stools. No difficulty urinating or gross hematuria. Does not see urology regularly.     Objective:   Physical Exam BP 130/84  Pulse 62  Temp(Src) 98 F (36.7 C) (Oral)  Ht 5\' 8"  (1.727 m)  Wt 218 lb (98.884 kg)  BMI 33.15 kg/m2  SpO2 94%  General -- alert, well-developed, NAD, height decreased 1 inch .   Neck --no thyromegaly , normal carotid pulse Lungs -- normal respiratory effort, no intercostal retractions, no accessory muscle use, and normal breath sounds.  Heart-- normal rate, regular rhythm, no murmur, and no gallop.   Abdomen--soft, non-tender, no distention, no masses, no HSM, no guarding, and no rigidity.   Extremities-- no pretibial edema bilaterally Rectal-- single ext hemorrhoid . Normal sphincter tone. I feel some scar tissue at 12:00, likely from previous surgery, no polyp or mass per se. Manson Passey stool, Hemoccult negative Prostate:   Minimal enlargement, not tender or nodular  Neurologic-- alert & oriented X3 and strength normal in all extremities. Psych-- Cognition and judgment appear intact. Alert and cooperative with normal attention span and concentration.  not anxious appearing and not depressed appearing.       Assessment & Plan:

## 2013-05-14 NOTE — Assessment & Plan Note (Signed)
Well controlled, labs , no change

## 2013-05-14 NOTE — Patient Instructions (Addendum)
Next visit in one year or as long as you feel well. Stay active. Eat healthy  Fall Prevention and Home Safety Falls cause injuries and can affect all age groups. It is possible to use preventive measures to significantly decrease the likelihood of falls. There are many simple measures which can make your home safer and prevent falls. OUTDOORS  Repair cracks and edges of walkways and driveways.  Remove high doorway thresholds.  Trim shrubbery on the main path into your home.  Have good outside lighting.  Clear walkways of tools, rocks, debris, and clutter.  Check that handrails are not broken and are securely fastened. Both sides of steps should have handrails.  Have leaves, snow, and ice cleared regularly.  Use sand or salt on walkways during winter months.  In the garage, clean up grease or oil spills. BATHROOM  Install night lights.  Install grab bars by the toilet and in the tub and shower.  Use non-skid mats or decals in the tub or shower.  Place a plastic non-slip stool in the shower to sit on, if needed.  Keep floors dry and clean up all water on the floor immediately.  Remove soap buildup in the tub or shower on a regular basis.  Secure bath mats with non-slip, double-sided rug tape.  Remove throw rugs and tripping hazards from the floors. BEDROOMS  Install night lights.  Make sure a bedside light is easy to reach.  Do not use oversized bedding.  Keep a telephone by your bedside.  Have a firm chair with side arms to use for getting dressed.  Remove throw rugs and tripping hazards from the floor. KITCHEN  Keep handles on pots and pans turned toward the center of the stove. Use back burners when possible.  Clean up spills quickly and allow time for drying.  Avoid walking on wet floors.  Avoid hot utensils and knives.  Position shelves so they are not too high or low.  Place commonly used objects within easy reach.  If necessary, use a sturdy  step stool with a grab bar when reaching.  Keep electrical cables out of the way.  Do not use floor polish or wax that makes floors slippery. If you must use wax, use non-skid floor wax.  Remove throw rugs and tripping hazards from the floor. STAIRWAYS  Never leave objects on stairs.  Place handrails on both sides of stairways and use them. Fix any loose handrails. Make sure handrails on both sides of the stairways are as long as the stairs.  Check carpeting to make sure it is firmly attached along stairs. Make repairs to worn or loose carpet promptly.  Avoid placing throw rugs at the top or bottom of stairways, or properly secure the rug with carpet tape to prevent slippage. Get rid of throw rugs, if possible.  Have an electrician put in a light switch at the top and bottom of the stairs. OTHER FALL PREVENTION TIPS  Wear low-heel or rubber-soled shoes that are supportive and fit well. Wear closed toe shoes.  When using a stepladder, make sure it is fully opened and both spreaders are firmly locked. Do not climb a closed stepladder.  Add color or contrast paint or tape to grab bars and handrails in your home. Place contrasting color strips on first and last steps.  Learn and use mobility aids as needed. Install an electrical emergency response system.  Turn on lights to avoid dark areas. Replace light bulbs that burn out immediately. Get  light switches that glow.  Arrange furniture to create clear pathways. Keep furniture in the same place.  Firmly attach carpet with non-skid or double-sided tape.  Eliminate uneven floor surfaces.  Select a carpet pattern that does not visually hide the edge of steps.  Be aware of all pets. OTHER HOME SAFETY TIPS  Set the water temperature for 120 F (48.8 C).  Keep emergency numbers on or near the telephone.  Keep smoke detectors on every level of the home and near sleeping areas. Document Released: 11/30/2002 Document Revised:  06/10/2012 Document Reviewed: 02/29/2012 [facility] Patient Information 2014 [place], [place].

## 2013-05-14 NOTE — Assessment & Plan Note (Signed)
labs

## 2013-05-14 NOTE — Assessment & Plan Note (Addendum)
H/o leg pain, see previous entry, Neg ABIs 11-2012

## 2013-05-14 NOTE — Assessment & Plan Note (Addendum)
Td 05 pneumonia shot 2005 and  2010 shingles vaccine 2010  Cscope 6-08  (Dr Leone Payor) neg but had a polypoid anal lesion, lesion removed by Dr Lurene Shadow next Cscope-- 2018  Continue with a healthy lifestyle Height decreased 1 inch---> rx a DEXA Lost a son to and in her recent last year, reports he is doing okay emotionally, recommend to call if he feels needs help.

## 2013-05-14 NOTE — Assessment & Plan Note (Signed)
>>  ASSESSMENT AND PLAN FOR DYSLIPIDEMIA WRITTEN ON 05/14/2013  9:03 AM BY PAZ, Alda Berthold, MD  labs

## 2013-05-14 NOTE — Assessment & Plan Note (Signed)
Check a CBC

## 2013-05-14 NOTE — Assessment & Plan Note (Signed)
Still has sx , intolerant to meds d/t constipation. Exam today showed minimal prostate enlargment, OAB? Recommend a trial with OTC saw palmetto

## 2013-05-22 ENCOUNTER — Telehealth: Payer: Self-pay | Admitting: *Deleted

## 2013-05-22 MED ORDER — ATORVASTATIN CALCIUM 10 MG PO TABS: 10.0000 mg | ORAL_TABLET | Freq: Every day | ORAL | Status: DC

## 2013-05-22 NOTE — Telephone Encounter (Signed)
Discussed with pt. rx sent to pharmacy.

## 2013-05-22 NOTE — Telephone Encounter (Signed)
Message copied by Nada Maclachlan on Fri May 22, 2013 11:58 AM ------      Message from: Willow Ora E      Created: Mon May 18, 2013 11:54 AM       Advise patient:      His potassium, kidney function, blood count and prostate tests are all normal.      Cholesterol is elevated, the LDL or bad cholesterol is 161.0, goal is 100.      He responded very well to Lipitor 10 mg one by mouth each bedtime In the past, I recommend to restart the medication: call in 30 and 6RF      Arrange a FLP, AST, ALT in 2 months ------

## 2013-06-04 ENCOUNTER — Telehealth: Payer: Self-pay | Admitting: Pulmonary Disease

## 2013-06-04 NOTE — Telephone Encounter (Signed)
05/26/13 >> 10 cm very effective Good usage No leak

## 2013-06-04 NOTE — Telephone Encounter (Signed)
lmtcb x1 for pt. 

## 2013-06-05 NOTE — Telephone Encounter (Signed)
Returning call.

## 2013-06-09 NOTE — Telephone Encounter (Signed)
I spoke with patient about results and he verbalized understanding and had no questions 

## 2013-06-12 ENCOUNTER — Other Ambulatory Visit: Payer: Medicare Other

## 2013-06-15 ENCOUNTER — Encounter: Payer: Self-pay | Admitting: Pulmonary Disease

## 2013-07-09 ENCOUNTER — Encounter: Payer: Self-pay | Admitting: Pulmonary Disease

## 2013-07-09 ENCOUNTER — Ambulatory Visit (INDEPENDENT_AMBULATORY_CARE_PROVIDER_SITE_OTHER): Payer: Medicare Other | Admitting: Pulmonary Disease

## 2013-07-09 VITALS — BP 130/70 | HR 64 | Temp 99.8°F | Ht 69.0 in | Wt 220.4 lb

## 2013-07-09 DIAGNOSIS — G4733 Obstructive sleep apnea (adult) (pediatric): Secondary | ICD-10-CM

## 2013-07-09 NOTE — Patient Instructions (Addendum)
Your CPAP is set at 10 cm

## 2013-07-09 NOTE — Progress Notes (Signed)
  Subjective:    Patient ID: Darren Barnes, male    DOB: 02/10/1941, 72 y.o.   MRN: [id number]  HPI 72 year old African American gentleman  for FU of obstructive sleep apnea.  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  Epworth sleepiness score is 12/24. Wife has noted a severe snoring problem for many years and now sleeps in a different bedroom. He reports nocturia and has undergone evaluation by urology. He reports non-refreshing sleep. He reports falling asleep watching TV or sitting and reading.  He has 2 drinks every evening including a scotch close to bedtime. Bedtime is 9 PM , sleep latency is minimal , he sleeps on his side with one pillow. He is 3-4 awakenings for nocturia and is out of bed at 6 AM feeling tired with dryness of mouth but denies headaches.  He is gained 10 pounds over the last 2 years. He drinks 1-2 cups of coffee in the morning and denies excessive use of caffeinated beverages  There were 13 obstructive apneas, 1 central apnea, 0 mixed apneas, and 21 hypopneas with apnea-hypopnea index of 8 events per hour. There were 26 RERAs with an RDI of 7 events per hour. Longest apnea was 40 seconds and longest hypopnea was 78% during REM sleep.   Download 03/2013 shows good usage, avg pr 10 cm, ,no residuals, no leak   07/09/2013 06/16/13 >> 10 cm very effective , mild leak, no residuals Good usage > 6h Pt states he wears his CPAP everynight x 4-6 hrs a night. denies any problems w/ mask/machine. feels rested during the day for the most part.  Pt is wearing is CPAP everynight. He denies any problems w/ mask/machine. He is sleeping better now.   Review of Systems neg for any significant sore throat, dysphagia, itching, sneezing, nasal congestion or excess/ purulent secretions, fever, chills, sweats, unintended wt loss, pleuritic or exertional cp, hempoptysis, orthopnea pnd or change in chronic leg swelling. Also denies presyncope, palpitations, heartburn, abdominal pain,  nausea, vomiting, diarrhea or change in bowel or urinary habits, dysuria,hematuria, rash, arthralgias, visual complaints, headache, numbness weakness or ataxia.     Objective:   Physical Exam  Gen. Pleasant, obese, in no distress ENT - no lesions, no post nasal drip Neck: No JVD, no thyromegaly, no carotid bruits Lungs: no use of accessory muscles, no dullness to percussion, decreased without rales or rhonchi  Cardiovascular: Rhythm regular, heart sounds  normal, no murmurs or gallops, no peripheral edema Musculoskeletal: No deformities, no cyanosis or clubbing , no tremors       Assessment & Plan:

## 2013-07-13 NOTE — Assessment & Plan Note (Signed)
CPAP is set at 10 cm  Weight loss encouraged, compliance with goal of at least 4-6 hrs every night is the expectation. Advised against medications with sedative side effects Cautioned against driving when sleepy - understanding that sleepiness will vary on a day to day basis

## 2013-10-23 ENCOUNTER — Other Ambulatory Visit: Payer: Self-pay | Admitting: Internal Medicine

## 2013-10-23 NOTE — Telephone Encounter (Signed)
rx refilled per protocol. DJR  

## 2014-02-17 ENCOUNTER — Telehealth: Payer: Self-pay | Admitting: Internal Medicine

## 2014-02-17 MED ORDER — VARDENAFIL HCL 20 MG PO TABS: 20.0000 mg | ORAL_TABLET | Freq: Every day | ORAL | Status: DC | PRN

## 2014-02-17 NOTE — Telephone Encounter (Signed)
Lmovm

## 2014-02-17 NOTE — Telephone Encounter (Signed)
Advise pt I sent a Rx to [facility]

## 2014-02-17 NOTE — Addendum Note (Signed)
Addended by: Kathlene November E on: 02/17/2014 03:06 PM   Modules accepted: Orders

## 2014-02-17 NOTE — Telephone Encounter (Signed)
Pt requesting refill - levitra  Last refilled - 05/15/11 Last OV- 05/14/13  Ok to refill ?

## 2014-02-17 NOTE — Telephone Encounter (Signed)
Pt came in requesting a refill on Levitra.  Please advise.

## 2014-02-19 ENCOUNTER — Other Ambulatory Visit: Payer: Self-pay | Admitting: *Deleted

## 2014-02-19 NOTE — Telephone Encounter (Signed)
Diltiazem 24 hr er 240 mg #90   Drug interaction form in box

## 2014-02-19 NOTE — Telephone Encounter (Signed)
Ref to pcp,not followed by cardiology

## 2014-02-19 NOTE — Addendum Note (Signed)
Addended by: Barbarann Ehlers A on: 02/19/2014 12:20 PM   Modules accepted: Orders

## 2014-02-19 NOTE — Telephone Encounter (Signed)
Refer to Dr.Paz, pcp, not seen in cardiology

## 2014-02-21 NOTE — Telephone Encounter (Signed)
Ok 180, no RF 

## 2014-03-01 ENCOUNTER — Other Ambulatory Visit: Payer: Self-pay | Admitting: *Deleted

## 2014-03-01 DIAGNOSIS — I10 Essential (primary) hypertension: Secondary | ICD-10-CM

## 2014-03-01 MED ORDER — DILTIAZEM HCL ER COATED BEADS 240 MG PO CP24: ORAL_CAPSULE | ORAL | Status: DC

## 2014-03-01 NOTE — Telephone Encounter (Signed)
Refill for cardizem sent to [facility] on [address]

## 2014-03-18 ENCOUNTER — Encounter: Payer: Self-pay | Admitting: Internal Medicine

## 2014-03-18 ENCOUNTER — Ambulatory Visit (INDEPENDENT_AMBULATORY_CARE_PROVIDER_SITE_OTHER): Payer: Medicare Other | Admitting: Internal Medicine

## 2014-03-18 VITALS — BP 144/76 | HR 67 | Temp 98.0°F | Wt 190.0 lb

## 2014-03-18 DIAGNOSIS — M1990 Unspecified osteoarthritis, unspecified site: Secondary | ICD-10-CM

## 2014-03-18 DIAGNOSIS — E785 Hyperlipidemia, unspecified: Secondary | ICD-10-CM

## 2014-03-18 MED ORDER — VARDENAFIL HCL 20 MG PO TABS: 20.0000 mg | ORAL_TABLET | Freq: Every day | ORAL | Status: DC | PRN

## 2014-03-18 NOTE — Progress Notes (Signed)
Subjective:    Patient ID: Darren Barnes, male    DOB: 1941-02-27, 73 y.o.   MRN: [id number]  DOS:  03/18/2014 Type of  visit: Acute visit  One-year history (at least) of left knee pain. Previously saw ortho, wass told knee was bone on bone. Unable to exercise as much as he would like   Pain is mostly in the frontal area of the knee  Also, was prescribed Lipitor last year, used  temporarily, discontinued because of aches  ROS No locking, no give way Denies redness or warmness. Some swelling. Tried ibuprofen but quit because constipation, not taking any specific medication for pain.  Past Medical History  Diagnosis Date  . Hyperlipidemia   . Hypertension   . ED (erectile dysfunction)   . Thrombocytopenia     saw hematology, observe  . Osteoarthritis   . Nocturia     saw urology 2012, was eval, rx vesicare (after several other meds failed)  . OSA on CPAP     Past Surgical History  Procedure Laterality Date  . Benign tumor from left arm    . Sinus surgery for benign tumor    . Knee arthroscopy  2004  . Anal polyp resection  2008    Dr Debbora Presto    History   Social History  . Marital Status: Married    Spouse Name: N/A    Number of Children: 3  . Years of Education: N/A   Occupational History  . Retired [employer] of [place]     retired   Social History Main Topics  . Smoking status: Never Smoker   . Smokeless tobacco: Never Used  . Alcohol Use: Yes     Comment: 2 per day on average  . Drug Use: No  . Sexual Activity: Not on file   Other Topics Concern  . Not on file   Social History Narrative   Lost one son, 2 living children                 Medication List       This list is accurate as of: 03/18/14  2:57 PM.  Always use your most recent med list.               aspirin 81 MG tablet  Take 81 mg by mouth daily.     diltiazem 240 MG 24 hr capsule  Commonly known as:  CARDIZEM CD  TAKE 1 CAPSULE (240 MG TOTAL) BY MOUTH 2 (TWO) TIMES DAILY.     latanoprost 0.005 % ophthalmic solution  Commonly known as:  XALATAN     multivitamin per tablet  Take 1 tablet by mouth daily.     vardenafil 20 MG tablet  Commonly known as:  LEVITRA  Take 1 tablet (20 mg total) by mouth daily as needed.           Objective:   Physical Exam BP 144/76  Pulse 67  Temp(Src) 98 F (36.7 C)  Wt 190 lb (86.183 kg)  SpO2 97%  General -- alert, well-developed, NAD.  Extremities--  no pretibial edema bilaterally  Both knees with deformities consistent with DJD, worse on the left. Left knee without effusion, redness or warmness, range of motion is okay. Joint is a stable Neurologic--  alert & oriented X3. Speech normal, gait normal, strength normal in all extremities.  Psych-- Cognition and judgment appear intact. Cooperative with normal attention span and concentration. No anxious or depressed appearing.  Assessment & Plan:

## 2014-03-18 NOTE — Patient Instructions (Addendum)
We are referring to a orthopedic specialist     Osteoarthritis Osteoarthritis is a disease that causes soreness and swelling (inflammation) of a joint. It occurs when the cartilage at the affected joint wears down. Cartilage acts as a cushion, covering the ends of bones where they meet to form a joint. Osteoarthritis is the most common form of arthritis. It often occurs in older people. The joints affected most often by this condition include those in the:  Ends of the fingers.  Thumbs.  Neck.  Lower back.  Knees.  Hips. CAUSES  Over time, the cartilage that covers the ends of bones begins to wear away. This causes bone to rub on bone, producing pain and stiffness in the affected joints.  RISK FACTORS Certain factors can increase your chances of having osteoarthritis, including:  Older age.  Excessive body weight.  Overuse of joints. SIGNS AND SYMPTOMS   Pain, swelling, and stiffness in the joint.  Over time, the joint may lose its normal shape.  Small deposits of bone (osteophytes) may grow on the edges of the joint.  Bits of bone or cartilage can break off and float inside the joint space. This may cause more pain and damage. DIAGNOSIS  Your health care provider will do a physical exam and ask about your symptoms. Various tests may be ordered, such as:  X-rays of the affected joint.  An MRI scan.  Blood tests to rule out other types of arthritis.  Joint fluid tests. This involves using a needle to draw fluid from the joint and examining the fluid under a microscope. TREATMENT  Goals of treatment are to control pain and improve joint function. Treatment plans may include:  A prescribed exercise program that allows for rest and joint relief.  A weight control plan.  Pain relief techniques, such as:  Properly applied heat and cold.  Electric pulses delivered to nerve endings under the skin (transcutaneous electrical nerve stimulation,  TENS).  Massage.  Certain nutritional supplements.  Medicines to control pain, such as:  Acetaminophen.  Nonsteroidal anti-inflammatory drugs (NSAIDs), such as naproxen.  Narcotic or central-acting agents, such as tramadol.  Corticosteroids. These can be given orally or as an injection.  Surgery to reposition the bones and relieve pain (osteotomy) or to remove loose pieces of bone and cartilage. Joint replacement may be needed in advanced states of osteoarthritis. HOME CARE INSTRUCTIONS   Only take over-the-counter or prescription medicines as directed by your health care provider. Take all medicines exactly as instructed.  Maintain a healthy weight. Follow your health care provider's instructions for weight control. This may include dietary instructions.  Exercise as directed. Your health care provider can recommend specific types of exercise. These may include:  Strengthening exercises These are done to strengthen the muscles that support joints affected by arthritis. They can be performed with weights or with exercise bands to add resistance.  Aerobic activities These are exercises, such as brisk walking or low-impact aerobics, that get your heart pumping.  Range-of-motion activities These keep your joints limber.  Balance and agility exercises These help you maintain daily living skills.  Rest your affected joints as directed by your health care provider.  Follow up with your health care provider as directed. SEEK MEDICAL CARE IF:   Your skin turns red.  You develop a rash in addition to your joint pain.  You have worsening joint pain. SEEK IMMEDIATE MEDICAL CARE IF:  You have a significant loss of weight or appetite.  You have  a fever along with joint or muscle aches.  You have night sweats. Fate of Arthritis and Musculoskeletal and Skin Diseases: www.niams.SouthExposed.es [employer] on Aging: http://kim-miller.com/ American  College of Rheumatology: www.rheumatology.org Document Released: 12/10/2005 Document Revised: 09/30/2013 Document Reviewed: 08/17/2013 [facility] Patient Information 2014 [place], [place].

## 2014-03-18 NOTE — Assessment & Plan Note (Signed)
>>  ASSESSMENT AND PLAN FOR DYSLIPIDEMIA WRITTEN ON 03/18/2014 10:04 AM BY PAZ, JOSE E, MD  Try Lipitor temporarily last year, discontinued d/t aches. Plan: Recheck labs on return to the office

## 2014-03-18 NOTE — Assessment & Plan Note (Addendum)
Knee pain, likely DJD. Refer to ortho Recommend to try to stay active, bicycling? Also ice and a knee sleeve

## 2014-03-18 NOTE — Assessment & Plan Note (Signed)
Try Lipitor temporarily last year, discontinued d/t aches. Plan: Recheck labs on return to the office

## 2014-03-18 NOTE — Progress Notes (Signed)
Pre visit review using our clinic review tool, if applicable. No additional management support is needed unless otherwise documented below in the visit note. 

## 2014-05-19 ENCOUNTER — Telehealth: Payer: Self-pay

## 2014-05-19 NOTE — Telephone Encounter (Signed)
Medication List and allergies:  Updated and Reviewed  90 day supply/mail order: n/a Local prescriptions:  [id number] - [place], [place] - [place].  Immunization due:   Td/Tdap  A/P: No changes to personal, family or [place] Flu- 08/24/13 Tdap- last received on 04/17/04--DUE PNA- 05/03/09 Shingles- 05/10/09 CCS- 06/12/07- polypoid lesion associated with hemorrhoids and diverticulosis.  Repeat 05/2017. PSA- 05/14/13- 1.56   To discuss with provider:  Patient states that he takes his diltiazem two capsules (480 mg)  once a day instead of 1 capsule (240 mg) twice a day.

## 2014-05-20 ENCOUNTER — Ambulatory Visit (INDEPENDENT_AMBULATORY_CARE_PROVIDER_SITE_OTHER): Payer: Medicare Other | Admitting: Internal Medicine

## 2014-05-20 ENCOUNTER — Encounter: Payer: Self-pay | Admitting: Internal Medicine

## 2014-05-20 VITALS — BP 163/74 | HR 66 | Temp 98.0°F | Ht 67.5 in | Wt 192.0 lb

## 2014-05-20 DIAGNOSIS — Z23 Encounter for immunization: Secondary | ICD-10-CM

## 2014-05-20 DIAGNOSIS — E785 Hyperlipidemia, unspecified: Secondary | ICD-10-CM

## 2014-05-20 DIAGNOSIS — Z0000 Encounter for general adult medical examination without abnormal findings: Secondary | ICD-10-CM

## 2014-05-20 DIAGNOSIS — N5089 Other specified disorders of the male genital organs: Secondary | ICD-10-CM

## 2014-05-20 DIAGNOSIS — I10 Essential (primary) hypertension: Secondary | ICD-10-CM

## 2014-05-20 DIAGNOSIS — F528 Other sexual dysfunction not due to a substance or known physiological condition: Secondary | ICD-10-CM

## 2014-05-20 DIAGNOSIS — R29890 Loss of height: Secondary | ICD-10-CM

## 2014-05-20 DIAGNOSIS — M1990 Unspecified osteoarthritis, unspecified site: Secondary | ICD-10-CM

## 2014-05-20 LAB — COMPREHENSIVE METABOLIC PANEL
ALT: 29 U/L (ref 0–53)
AST: 25 U/L (ref 0–37)
Albumin: 4.1 g/dL (ref 3.5–5.2)
Alkaline Phosphatase: 61 U/L (ref 39–117)
BUN: 13 mg/dL (ref 6–23)
CHLORIDE: 105 meq/L (ref 96–112)
CO2: 28 mEq/L (ref 19–32)
CREATININE: 1 mg/dL (ref 0.4–1.5)
Calcium: 9.2 mg/dL (ref 8.4–10.5)
GFR: 89.48 mL/min (ref >60.00)
GLUCOSE: 84 mg/dL (ref 70–99)
Potassium: 4.2 mEq/L (ref 3.5–5.1)
Sodium: 141 mEq/L (ref 135–145)
Total Bilirubin: 0.7 mg/dL (ref 0.2–1.2)
Total Protein: 7 g/dL (ref 6.0–8.3)

## 2014-05-20 LAB — LIPID PANEL
Cholesterol: 197 mg/dL (ref 0–200)
HDL: 73.8 mg/dL (ref >39.00)
LDL Cholesterol: 118 mg/dL — ABNORMAL HIGH (ref 0–99)
TRIGLYCERIDES: 28 mg/dL (ref 0.0–149.0)
Total CHOL/HDL Ratio: 3
VLDL: 5.6 mg/dL (ref 0.0–40.0)

## 2014-05-20 LAB — TSH: TSH: 1.3 u[IU]/mL (ref 0.35–4.50)

## 2014-05-20 MED ORDER — VARDENAFIL HCL 20 MG PO TABS: 20.0000 mg | ORAL_TABLET | Freq: Every day | ORAL | Status: DC | PRN

## 2014-05-20 NOTE — Assessment & Plan Note (Signed)
Continue with knee pain but it has decreased after local injections. F/u by ortho

## 2014-05-20 NOTE — Assessment & Plan Note (Signed)
RF meds

## 2014-05-20 NOTE — Assessment & Plan Note (Addendum)
Td 2015 pneumonia shot 2005 and  2010 prevnar today shingles vaccine 2010  Cscope 6-08  (Dr Carlean Purl) neg but had a polypoid anal lesion, lesion removed by Dr Bubba Camp next Cscope-- 2018   healthy lifestyle--> discussed  Height decreased --> rx a DEXA last year, not done, will try again  DRE done last year, PSA stable, last PSA 2014; plan to recheck 2016

## 2014-05-20 NOTE — Assessment & Plan Note (Signed)
>>  ASSESSMENT AND PLAN FOR DYSLIPIDEMIA WRITTEN ON 05/20/2014  1:43 PM BY PAZ, JOSE E, MD  Not on meds, labs . Is trying to do better with diet and exercise, will be somehow reluctant to take medications

## 2014-05-20 NOTE — Assessment & Plan Note (Signed)
Seems to be well controlled, continue with same medications, continue monitor ambulatory BPs, labs

## 2014-05-20 NOTE — Patient Instructions (Signed)
Get your blood work before you leave   Check the  blood pressure 2  times a  week be sure it is between 110/60 and 140/85. Ideal blood pressure is 120/80. If it is consistently higher or lower, let me know  Next visit is for a physical exam in 1 year,  fasting Please make an appointment     Fall Prevention and Home Safety Falls cause injuries and can affect all age groups. It is possible to use preventive measures to significantly decrease the likelihood of falls. There are many simple measures which can make your home safer and prevent falls. OUTDOORS  Repair cracks and edges of walkways and driveways.  Remove high doorway thresholds.  Trim shrubbery on the main path into your home.  Have good outside lighting.  Clear walkways of tools, rocks, debris, and clutter.  Check that handrails are not broken and are securely fastened. Both sides of steps should have handrails.  Have leaves, snow, and ice cleared regularly.  Use sand or salt on walkways during winter months.  In the garage, clean up grease or oil spills. BATHROOM  Install night lights.  Install grab bars by the toilet and in the tub and shower.  Use non-skid mats or decals in the tub or shower.  Place a plastic non-slip stool in the shower to sit on, if needed.  Keep floors dry and clean up all water on the floor immediately.  Remove soap buildup in the tub or shower on a regular basis.  Secure bath mats with non-slip, double-sided rug tape.  Remove throw rugs and tripping hazards from the floors. BEDROOMS  Install night lights.  Make sure a bedside light is easy to reach.  Do not use oversized bedding.  Keep a telephone by your bedside.  Have a firm chair with side arms to use for getting dressed.  Remove throw rugs and tripping hazards from the floor. KITCHEN  Keep handles on pots and pans turned toward the center of the stove. Use back burners when possible.  Clean up spills quickly and allow  time for drying.  Avoid walking on wet floors.  Avoid hot utensils and knives.  Position shelves so they are not too high or low.  Place commonly used objects within easy reach.  If necessary, use a sturdy step stool with a grab bar when reaching.  Keep electrical cables out of the way.  Do not use floor polish or wax that makes floors slippery. If you must use wax, use non-skid floor wax.  Remove throw rugs and tripping hazards from the floor. STAIRWAYS  Never leave objects on stairs.  Place handrails on both sides of stairways and use them. Fix any loose handrails. Make sure handrails on both sides of the stairways are as long as the stairs.  Check carpeting to make sure it is firmly attached along stairs. Make repairs to worn or loose carpet promptly.  Avoid placing throw rugs at the top or bottom of stairways, or properly secure the rug with carpet tape to prevent slippage. Get rid of throw rugs, if possible.  Have an electrician put in a light switch at the top and bottom of the stairs. OTHER FALL PREVENTION TIPS  Wear low-heel or rubber-soled shoes that are supportive and fit well. Wear closed toe shoes.  When using a stepladder, make sure it is fully opened and both spreaders are firmly locked. Do not climb a closed stepladder.  Add color or contrast paint or tape  to grab bars and handrails in your home. Place contrasting color strips on first and last steps.  Learn and use mobility aids as needed. Install an electrical emergency response system.  Turn on lights to avoid dark areas. Replace light bulbs that burn out immediately. Get light switches that glow.  Arrange furniture to create clear pathways. Keep furniture in the same place.  Firmly attach carpet with non-skid or double-sided tape.  Eliminate uneven floor surfaces.  Select a carpet pattern that does not visually hide the edge of steps.  Be aware of all pets. OTHER HOME SAFETY TIPS  Set the water  temperature for 120 F (48.8 C).  Keep emergency numbers on or near the telephone.  Keep smoke detectors on every level of the home and near sleeping areas. Document Released: 11/30/2002 Document Revised: 06/10/2012 Document Reviewed: 02/29/2012 [facility] Patient Information 2014 [place].

## 2014-05-20 NOTE — Progress Notes (Signed)
Pre visit review using our clinic review tool, if applicable. No additional management support is needed unless otherwise documented below in the visit note. 

## 2014-05-20 NOTE — Progress Notes (Signed)
Subjective:    Patient ID: Darren Barnes, male    DOB: [birth year], 73 y.o.   MRN: [id number]  DOS:  05/20/2014 Type of  Visit:   Here for a [facility] exam   1. Risk factors based on Past M, S, F history: reviewed   2. Physical Activities: Heavy yard work (has 3 acres), usually exercises some days  3. Depression/mood: (-) screening  4. Hearing: No problems noted or reported   5. ADL's: Independent   6. Fall Risk: no recent falls, prevention discussed 7. home Safety: does feelsafe at home   8. Height, weight, &visual acuity: see VS, vision corrected w/ glasses , apparently dx w/ glaucoma 9. Counseling: provided   10. Labs ordered based on risk factors: if needed   11. Referral Coordination: if needed   12. Care Plan, see assessment and plan   13. Cognitive Assessment: Doing well from the cognitive and motor standpoint   In addition, we discussed the following HTN, good medication compliance, ambulatory BPs usually 150, 140. DJD, continue with knee pain but it has decreased after local injections. Diagnosed with sleep apnea, good compliance with CPAP ED, needs a refill on Levitra, a three-month supply.  ROS No  CP, SOB No palpitations, no lower extremity edema Denies  nausea, vomiting diarrhea Denies  blood in the stools (-) cough, sputum production (-) wheezing, chest congestion  No dysuria, gross hematuria, difficulty urinating  No headaches Denies diplopia, slurred speech, motor deficits, facial numbness Denies dizziness    Past Medical History  Diagnosis Date  . Hyperlipidemia   . Hypertension   . ED (erectile dysfunction)   . Thrombocytopenia     saw hematology, observe  . Osteoarthritis   . Nocturia     saw urology 2012, was eval, rx vesicare (after several other meds failed)  . OSA on CPAP   . Glaucoma     Past Surgical History  Procedure Laterality Date  . Benign tumor from left arm    . Sinus surgery for benign tumor    . Knee arthroscopy  2004  . Anal  polyp resection  2008    Dr Debbora Presto    History   Social History  . Marital Status: Married    Spouse Name: N/A    Number of Children: 3  . Years of Education: N/A   Occupational History  . Retired [employer] of [place]     retired   Social History Main Topics  . Smoking status: Never Smoker   . Smokeless tobacco: Never Used  . Alcohol Use: Yes     Comment: 2 per day on average  . Drug Use: No  . Sexual Activity: Not on file   Other Topics Concern  . Not on file   Social History Narrative   Lost one son, 2 living children, 8 g-kids                 Family History  Problem Relation Age of Onset  . Coronary artery disease Father     MI at 74  . Hypertension Mother     Jerilynn Mages and F   . Diabetes Mother   . Stroke Mother 24  . Colon cancer Neg Hx   . Prostate cancer Neg Hx        Medication List       This list is accurate as of: 05/20/14  1:42 PM.  Always use your most recent med list.  aspirin 81 MG tablet  Take 81 mg by mouth daily.     diltiazem 240 MG 24 hr capsule  Commonly known as:  CARDIZEM CD  TAKE 1 CAPSULE (240 MG TOTAL) BY MOUTH 2 (TWO) TIMES DAILY.     latanoprost 0.005 % ophthalmic solution  Commonly known as:  XALATAN     multivitamin per tablet  Take 1 tablet by mouth daily.     vardenafil 20 MG tablet  Commonly known as:  LEVITRA  Take 1 tablet (20 mg total) by mouth daily as needed.           Objective:   Physical Exam BP 163/74  Pulse 66  Temp(Src) 98 F (36.7 C)  Ht 5' 7.5" (1.715 m)  Wt 192 lb (87.091 kg)  BMI 29.61 kg/m2  SpO2 98% General -- alert, well-developed, NAD.  Neck --no thyromegaly  HEENT-- Not pale.  Lungs -- normal respiratory effort, no intercostal retractions, no accessory muscle use, and normal breath sounds.  Heart-- normal rate, regular rhythm, no murmur.  Abdomen-- Not distended, good bowel sounds,soft, non-tender. Extremities-- no pretibial edema bilaterally  Neurologic--  alert & oriented  X3. Speech normal, gait normal, strength normal in all extremities.  Psych-- Cognition and judgment appear intact. Cooperative with normal attention span and concentration. No anxious or depressed appearing.        Assessment & Plan:

## 2014-05-20 NOTE — Assessment & Plan Note (Addendum)
Not on meds, labs . Is trying to do better with diet and exercise, will be somehow reluctant to take medications

## 2014-05-24 ENCOUNTER — Encounter: Payer: Self-pay | Admitting: *Deleted

## 2014-05-26 ENCOUNTER — Ambulatory Visit (INDEPENDENT_AMBULATORY_CARE_PROVIDER_SITE_OTHER)
Admission: RE | Admit: 2014-05-26 | Discharge: 2014-05-26 | Disposition: A | Discharge status: Home / Self Care | Payer: Medicare Other | Source: Ambulatory Visit | Attending: Internal Medicine | Admitting: Internal Medicine

## 2014-05-26 DIAGNOSIS — N5089 Other specified disorders of the male genital organs: Secondary | ICD-10-CM

## 2014-05-26 DIAGNOSIS — Z13820 Encounter for screening for osteoporosis: Secondary | ICD-10-CM

## 2014-05-26 DIAGNOSIS — R29890 Loss of height: Secondary | ICD-10-CM

## 2014-05-26 DIAGNOSIS — N508 Other specified disorders of male genital organs: Secondary | ICD-10-CM

## 2014-06-04 ENCOUNTER — Telehealth: Payer: Self-pay | Admitting: Internal Medicine

## 2014-06-04 NOTE — Telephone Encounter (Signed)
Spoke with patients wife, he was not in at the time of my call. Left message for pt to return call.   Reviewed recent CPE note from Dr. Larose Kells, heart rate was regular, no murmur. Irregular beats may be due to excessive caffeine, anxiety or multiple other factors. Will review this with pt when he returns my call.

## 2014-06-04 NOTE — Telephone Encounter (Signed)
Caller name:Debbie Relation to pt: [facility] wellness exam Call back [phone] Pharmacy:  Reason for call: Jackelyn Poling called from [facility] to inform Dr Larose Kells during patient wellness exam he had an irregularity heart beat. She states that everything else looks good no problems at all.  Also she would like for [place] to follow up with patient if there was any other concern.

## 2014-06-07 NOTE — Telephone Encounter (Signed)
Closing note until patient returns my call

## 2014-08-09 ENCOUNTER — Ambulatory Visit (INDEPENDENT_AMBULATORY_CARE_PROVIDER_SITE_OTHER): Payer: Medicare Other | Admitting: Internal Medicine

## 2014-08-09 ENCOUNTER — Encounter: Payer: Self-pay | Admitting: Internal Medicine

## 2014-08-09 VITALS — BP 160/71 | HR 67 | Temp 97.9°F | Wt 199.2 lb

## 2014-08-09 DIAGNOSIS — J309 Allergic rhinitis, unspecified: Secondary | ICD-10-CM

## 2014-08-09 DIAGNOSIS — J310 Chronic rhinitis: Secondary | ICD-10-CM | POA: Insufficient documentation

## 2014-08-09 DIAGNOSIS — I10 Essential (primary) hypertension: Secondary | ICD-10-CM

## 2014-08-09 NOTE — Patient Instructions (Signed)
use OTC Nasocort or Flonase : 2 nasal sprays on each side of the nose daily  If not getting better please call   Check the  blood pressure 2 or 3 times a week be sure it is between 110/60 and 140/85. Ideal blood pressure is 120/80. If it is consistently higher or lower, let me know

## 2014-08-09 NOTE — Assessment & Plan Note (Addendum)
Symptoms consistent with rhinitis, allergic? Vasomotor?. Plan: Trial with Flonase, if not better he will let me know

## 2014-08-09 NOTE — Progress Notes (Signed)
Pre-visit discussion using our clinic review tool. No additional management support is needed unless otherwise documented below in the visit note.  

## 2014-08-09 NOTE — Progress Notes (Signed)
Subjective:    Patient ID: [name], male    DOB: 03-07-1941, 73 y.o.   MRN: [id number]  DOS:  08/09/2014 Type of visit - description: acute History: 1 month history of runny nose, bilateral, discharge described as watery and very seldom green. The symptoms are worse at night, not increase with food intake. Also, a nurse from his insurance visited him, felt his heart was irregular, patient denies any chest pain or palpitations   ROS No fever chills, no headaches Denies itchy eyes, nose. No sneezing  Past Medical History  Diagnosis Date  . Hyperlipidemia   . Hypertension   . ED (erectile dysfunction)   . Thrombocytopenia     saw hematology, observe  . Osteoarthritis   . Nocturia     saw urology 2012, was eval, rx vesicare (after several other meds failed)  . OSA on CPAP   . Glaucoma     Past Surgical History  Procedure Laterality Date  . Benign tumor from left arm    . Sinus surgery for benign tumor    . Knee arthroscopy  2004  . Anal polyp resection  2008    Dr Debbora Presto    History   Social History  . Marital Status: Married    Spouse Name: N/A    Number of Children: 3  . Years of Education: N/A   Occupational History  . Retired [employer] of [place]     retired   Social History Main Topics  . Smoking status: Never Smoker   . Smokeless tobacco: Never Used  . Alcohol Use: Yes     Comment: 2 per day on average  . Drug Use: No  . Sexual Activity: Not on file   Other Topics Concern  . Not on file   Social History Narrative   Lost one son, 2 living children, 8 g-kids                    Medication List       This list is accurate as of: 08/09/14  6:59 PM.  Always use your most recent med list.               aspirin 81 MG tablet  Take 81 mg by mouth daily.     diltiazem 240 MG 24 hr capsule  Commonly known as:  CARDIZEM CD  TAKE 1 CAPSULE (240 MG TOTAL) BY MOUTH 2 (TWO) TIMES DAILY.     latanoprost 0.005 % ophthalmic solution  Commonly known as:   XALATAN     multivitamin per tablet  Take 1 tablet by mouth daily.     vardenafil 20 MG tablet  Commonly known as:  LEVITRA  Take 1 tablet (20 mg total) by mouth daily as needed.           Objective:   Physical Exam BP 160/71  Pulse 67  Temp(Src) 97.9 F (36.6 C) (Oral)  Wt 199 lb 4 oz (90.379 kg)  SpO2 94% General -- alert, well-developed, NAD.  HEENT-- Not pale. TMs normal, throat symmetric, no redness or discharge. Face symmetric, sinuses not tender to palpation. Nose slt congested.  Lungs -- normal respiratory effort, no intercostal retractions, no accessory muscle use, and normal breath sounds.  Heart-- normal rate, regular rhythm, no murmur.  Extremities-- no pretibial edema bilaterally  Neurologic--  alert & oriented X3. Speech normal, gait appropriate for age, strength symmetric and appropriate for age.  Psych-- Cognition and judgment appear intact. Cooperative with  normal attention span and concentration. No anxious or depressed appearing.      Assessment & Plan:   Heart rate is regular today on exam (see HPI)

## 2014-08-09 NOTE — Assessment & Plan Note (Signed)
BP slightly elevated today but in the ambulatory setting is usually in the 140s. Plan: Continue monitoring BPs

## 2014-08-18 ENCOUNTER — Other Ambulatory Visit: Payer: Self-pay | Admitting: Internal Medicine

## 2015-03-30 ENCOUNTER — Other Ambulatory Visit: Payer: Self-pay

## 2015-05-03 ENCOUNTER — Telehealth: Payer: Self-pay | Admitting: Internal Medicine

## 2015-05-03 NOTE — Telephone Encounter (Signed)
Pre Visit letter sent  °

## 2015-05-24 ENCOUNTER — Encounter: Payer: Self-pay | Admitting: Internal Medicine

## 2015-05-24 ENCOUNTER — Ambulatory Visit (INDEPENDENT_AMBULATORY_CARE_PROVIDER_SITE_OTHER): Payer: Medicare Other | Admitting: Internal Medicine

## 2015-05-24 VITALS — BP 138/76 | HR 67 | Temp 98.2°F | Ht 68.0 in | Wt 203.5 lb

## 2015-05-24 DIAGNOSIS — M150 Primary generalized (osteo)arthritis: Secondary | ICD-10-CM

## 2015-05-24 DIAGNOSIS — R351 Nocturia: Secondary | ICD-10-CM | POA: Diagnosis not present

## 2015-05-24 DIAGNOSIS — D696 Thrombocytopenia, unspecified: Secondary | ICD-10-CM | POA: Diagnosis not present

## 2015-05-24 DIAGNOSIS — I10 Essential (primary) hypertension: Secondary | ICD-10-CM

## 2015-05-24 DIAGNOSIS — E785 Hyperlipidemia, unspecified: Secondary | ICD-10-CM | POA: Diagnosis not present

## 2015-05-24 DIAGNOSIS — Z0000 Encounter for general adult medical examination without abnormal findings: Secondary | ICD-10-CM

## 2015-05-24 DIAGNOSIS — N400 Enlarged prostate without lower urinary tract symptoms: Secondary | ICD-10-CM

## 2015-05-24 DIAGNOSIS — M159 Polyosteoarthritis, unspecified: Secondary | ICD-10-CM

## 2015-05-24 LAB — URINALYSIS, ROUTINE W REFLEX MICROSCOPIC
BILIRUBIN URINE: NEGATIVE
Ketones, ur: NEGATIVE
LEUKOCYTES UA: NEGATIVE
NITRITE: POSITIVE — AB
PH: 6 (ref 5.0–8.0)
Specific Gravity, Urine: 1.015 (ref 1.000–1.030)
Total Protein, Urine: NEGATIVE
Urine Glucose: NEGATIVE
Urobilinogen, UA: 0.2 (ref 0.0–1.0)

## 2015-05-24 LAB — CBC WITH DIFFERENTIAL/PLATELET
BASOS ABS: 0 10*3/uL (ref 0.0–0.1)
Basophils Relative: 0.4 % (ref 0.0–3.0)
EOS ABS: 0.1 10*3/uL (ref 0.0–0.7)
Eosinophils Relative: 1 % (ref 0.0–5.0)
HEMATOCRIT: 48.2 % (ref 39.0–52.0)
Hemoglobin: 15.6 g/dL (ref 13.0–17.0)
LYMPHS ABS: 1.1 10*3/uL (ref 0.7–4.0)
LYMPHS PCT: 21.6 % (ref 12.0–46.0)
MCHC: 32.5 g/dL (ref 30.0–36.0)
MCV: 86.2 fl (ref 78.0–100.0)
Monocytes Absolute: 0.6 10*3/uL (ref 0.1–1.0)
Monocytes Relative: 12.7 % — ABNORMAL HIGH (ref 3.0–12.0)
NEUTROS ABS: 3.2 10*3/uL (ref 1.4–7.7)
Neutrophils Relative %: 64.3 % (ref 43.0–77.0)
Platelets: 220 10*3/uL (ref 150.0–400.0)
RBC: 5.59 Mil/uL (ref 4.22–5.81)
RDW: 13.2 % (ref 11.5–15.5)
WBC: 5 10*3/uL (ref 4.0–10.5)

## 2015-05-24 LAB — LIPID PANEL
Cholesterol: 199 mg/dL (ref 0–200)
HDL: 61.7 mg/dL (ref >39.00)
LDL CALC: 126 mg/dL — AB (ref 0–99)
NONHDL: 137.3
Total CHOL/HDL Ratio: 3
Triglycerides: 55 mg/dL (ref 0.0–149.0)
VLDL: 11 mg/dL (ref 0.0–40.0)

## 2015-05-24 LAB — BASIC METABOLIC PANEL
BUN: 16 mg/dL (ref 6–23)
CALCIUM: 9.2 mg/dL (ref 8.4–10.5)
CO2: 26 mEq/L (ref 19–32)
Chloride: 106 mEq/L (ref 96–112)
Creatinine, Ser: 1.01 mg/dL (ref 0.40–1.50)
GFR: 92.31 mL/min (ref >60.00)
GLUCOSE: 100 mg/dL — AB (ref 70–99)
Potassium: 4.1 mEq/L (ref 3.5–5.1)
SODIUM: 140 meq/L (ref 135–145)

## 2015-05-24 LAB — PSA: PSA: 1.79 ng/mL (ref 0.10–4.00)

## 2015-05-24 MED ORDER — TAMSULOSIN HCL 0.4 MG PO CAPS: 0.4000 mg | ORAL_CAPSULE | Freq: Every day | ORAL | Status: DC

## 2015-05-24 MED ORDER — DILTIAZEM HCL ER COATED BEADS 240 MG PO CP24: 240.0000 mg | ORAL_CAPSULE | Freq: Two times a day (BID) | ORAL | Status: DC

## 2015-05-24 NOTE — Patient Instructions (Signed)
Get your blood work before you leave     [place] cause injuries and can affect all age groups. It is possible to use preventive measures to significantly decrease the likelihood of falls. There are many simple measures which can make your home safer and prevent falls. OUTDOORS  Repair cracks and edges of walkways and driveways.  Remove high doorway thresholds.  Trim shrubbery on the main path into your home.  Have good outside lighting.  Clear walkways of tools, rocks, debris, and clutter.  Check that handrails are not broken and are securely fastened. Both sides of steps should have handrails.  Have leaves, snow, and ice cleared regularly.  Use sand or salt on walkways during winter months.  In the garage, clean up grease or oil spills. BATHROOM  Install night lights.  Install grab bars by the toilet and in the tub and shower.  Use non-skid mats or decals in the tub or shower.  Place a plastic non-slip stool in the shower to sit on, if needed.  Keep floors dry and clean up all water on the floor immediately.  Remove soap buildup in the tub or shower on a regular basis.  Secure bath mats with non-slip, double-sided rug tape.  Remove throw rugs and tripping hazards from the floors. BEDROOMS  Install night lights.  Make sure a bedside light is easy to reach.  Do not use oversized bedding.  Keep a telephone by your bedside.  Have a firm chair with side arms to use for getting dressed.  Remove throw rugs and tripping hazards from the floor. KITCHEN  Keep handles on pots and pans turned toward the center of the stove. Use back burners when possible.  Clean up spills quickly and allow time for drying.  Avoid walking on wet floors.  Avoid hot utensils and knives.  Position shelves so they are not too high or low.  Place commonly used objects within easy reach.  If necessary, use a sturdy step stool with a grab bar when  reaching.  Keep electrical cables out of the way.  Do not use floor polish or wax that makes floors slippery. If you must use wax, use non-skid floor wax.  Remove throw rugs and tripping hazards from the floor. STAIRWAYS  Never leave objects on stairs.  Place handrails on both sides of stairways and use them. Fix any loose handrails. Make sure handrails on both sides of the stairways are as long as the stairs.  Check carpeting to make sure it is firmly attached along stairs. Make repairs to worn or loose carpet promptly.  Avoid placing throw rugs at the top or bottom of stairways, or properly secure the rug with carpet tape to prevent slippage. Get rid of throw rugs, if possible.  Have an electrician put in a light switch at the top and bottom of the stairs. OTHER FALL PREVENTION TIPS  Wear low-heel or rubber-soled shoes that are supportive and fit well. Wear closed toe shoes.  When using a stepladder, make sure it is fully opened and both spreaders are firmly locked. Do not climb a closed stepladder.  Add color or contrast paint or tape to grab bars and handrails in your home. Place contrasting color strips on first and last steps.  Learn and use mobility aids as needed. Install an electrical emergency response system.  Turn on lights to avoid dark areas. Replace light bulbs that burn out immediately. Get light switches that glow.  Arrange  furniture to create clear pathways. Keep furniture in the same place.  Firmly attach carpet with non-skid or double-sided tape.  Eliminate uneven floor surfaces.  Select a carpet pattern that does not visually hide the edge of steps.  Be aware of all pets. OTHER HOME SAFETY TIPS  Set the water temperature for 120 F (48.8 C).  Keep emergency numbers on or near the telephone.  Keep smoke detectors on every level of the home and near sleeping areas. Document Released: 11/30/2002 Document Revised: 06/10/2012 Document Reviewed:  02/29/2012 [facility] Patient Information 2015 [place], [place]. This information is not intended to replace advice given to you by your health care provider. Make sure you discuss any questions you have with your health care provider.   Preventive Care for Adults Ages 49 and over  Blood pressure check.** / Every 1 to 2 years.  Lipid and cholesterol check.**/ Every 5 years beginning at age 6.  Lung cancer screening. / Every year if you are aged 45-80 years and have a 30-pack-year history of smoking and currently smoke or have quit within the past 15 years. Yearly screening is stopped once you have quit smoking for at least 15 years or develop a health problem that would prevent you from having lung cancer treatment.  Fecal occult blood test (FOBT) of stool. / Every year beginning at age 33 and continuing until age 83. You may not have to do this test if you get a colonoscopy every 10 years.  Flexible sigmoidoscopy** or colonoscopy.** / Every 5 years for a flexible sigmoidoscopy or every 10 years for a colonoscopy beginning at age 68 and continuing until age 69.  Hepatitis C blood test.** / For all people born from [birth year] through 1967 and any individual with known risks for hepatitis C.  Abdominal aortic aneurysm (AAA) screening.** / A one-time screening for ages 5 to 35 years who are current or former smokers.  Skin self-exam. / Monthly.  Influenza vaccine. / Every year.  Tetanus, diphtheria, and acellular pertussis (Tdap/Td) vaccine.** / 1 dose of Td every 10 years.  Varicella vaccine.** / Consult your health care provider.  Zoster vaccine.** / 1 dose for adults aged 25 years or older.  Pneumococcal 13-valent conjugate (PCV13) vaccine.** / Consult your health care provider.  Pneumococcal polysaccharide (PPSV23) vaccine.** / 1 dose for all adults aged 25 years and older.  Meningococcal vaccine.** / Consult your health care provider.  Hepatitis A vaccine.** / Consult your health care  provider.  Hepatitis B vaccine.** / Consult your health care provider.  Haemophilus influenzae type b (Hib) vaccine.** / Consult your health care provider. **Family history and personal history of risk and conditions may change your health care provider's recommendations. Document Released: 02/05/2002 Document Revised: 12/15/2013 Document Reviewed: 05/07/2011 [facility] Patient Information 2015 [place], [place]. This information is not intended to replace advice given to you by your health care provider. Make sure you discuss any questions you have with your health care provider.

## 2015-05-24 NOTE — Progress Notes (Signed)
Pre visit review using our clinic review tool, if applicable. No additional management support is needed unless otherwise documented below in the visit note. 

## 2015-05-24 NOTE — Progress Notes (Signed)
Subjective:    Patient ID: Darren Barnes, male    DOB: [birth year], 74 y.o.   MRN: [id number]  DOS:  05/24/2015 Type of visit - description : CPX Here for a [facility] exam  1. Risk factors based on Past M, S, F history: reviewed  2. Physical Activities: Heavy yard work (has 3 acres), usually exercises some days  3. Depression/mood: (-) screening  4. Hearing: No problems noted or reported  5. ADL's: Independent  6. Fall Risk: no recent falls, prevention discussed 7. home Safety: does feelsafe at home  8. Height, weight, &visual acuity: see VS, vision corrected w/ glasses , apparently dx w/ pre-glaucoma, has not seen ophthalmology, urged to go back, declined referral 9. Counseling: provided  10. Labs ordered based on risk factors: if needed  11. Referral Coordination: if needed  12. Care Plan, see assessment and plan  13. Cognitive Assessment: Doing well from the cognitive and motor standpoint  14. Care team updated  15. End of life care , rec to get a living will   In addition, we discussed the following:  Nocturia:  Patient presents today with   urinary urgency (new sx x few months)  and ongoing frequency. Patient has had increased urinary frequency for the past 3-4 years, and was referred to urology at that time for full workup where he was diagnosed with BPH and detrusor instability. Patient attempted multiple medications which were constipating. Patient denies any hematuria or dysuria.  Hypertension: Patient notes good compliance to medication, currently in need of RF. States systolic ambulatory BPs typically run around 140.   OA:  Patient received cortisone injections in both knees on 01/10/15 which helped with pain. Pain has returned and patient notes he plans to see ortho again in the next few weeks.   ED:  Patient notes this is well-controlled on Levitra, denies any side effects from medication.  HLD:  Not currently on any lipid-lowering drugs.  OSA: No recent  complaints, not using CPAP because wife could not tolerate noise. Denies any recent fatigue.   Review of Systems  Constitutional: No fever. No chills. No unexplained wt changes. No unusual sweats  HEENT: No dental problems, no ear discharge, no facial swelling, no voice changes. No eye discharge, no eye redness, no intolerance to light   Respiratory: No wheezing, no difficulty breathing. No cough, no mucus production  Cardiovascular: No CP, no leg swelling, no palpitations  GI: no nausea, no vomiting, no diarrhea ,no abdominal pain.  No blood in the stools. No dysphagia, no odynophagia    Endocrine: No polyphagia, no polyuria, no polydipsia  GU: No dysuria, gross hematuria, difficulty urinating. No urinary urgency, no frequency.  Musculoskeletal: No joint swellings or unusual aches or pains  Skin: No change in the color of the skin, pallor, no rash  Allergic, immunologic: No environmental allergies, no food allergies  Neurological: No dizziness no syncope. No headaches. No diplopia, no slurred speech, no motor deficits, no facial  Numbness  Hematological: No enlarged lymph nodes, no easy bruising, no unusual bleedings  Psychiatry: No suicidal ideas, no hallucinations, no behavior problems, no confusion.  No unusual/severe anxiety, no depression    Past Medical History  Diagnosis Date  . Hyperlipidemia   . Hypertension   . ED (erectile dysfunction)   . Thrombocytopenia     saw hematology, observe  . Osteoarthritis   . Nocturia     saw urology 2012, was eval, rx vesicare (after several other meds failed)  .  OSA on CPAP   . Glaucoma     Past Surgical History  Procedure Laterality Date  . Benign tumor from left arm    . Sinus surgery for benign tumor    . Knee arthroscopy  2004  . Anal polyp resection  2008    Dr Debbora Presto    History   Social History  . Marital Status: Married    Spouse Name: N/A  . Number of Children: 3  . Years of Education: N/A    Occupational History  . Retired [employer] [place]     retired   Social History Main Topics  . Smoking status: Never Smoker   . Smokeless tobacco: Never Used  . Alcohol Use: Yes     Comment: 2 per day on average  . Drug Use: No  . Sexual Activity: Not on file   Other Topics Concern  . Not on file   Social History Narrative   Lost one son, 2 living children, 8 g-kids                 Family History  Problem Relation Age of Onset  . Coronary artery disease Father     MI at 62  . Hypertension Mother     Jerilynn Mages and F   . Diabetes Mother   . Stroke Mother 56  . Colon cancer Neg Hx   . Prostate cancer Neg Hx        Medication List       This list is accurate as of: 05/24/15  5:52 PM.  Always use your most recent med list.               aspirin 81 MG tablet  Take 81 mg by mouth daily.     diltiazem 240 MG 24 hr capsule  Commonly known as:  CARDIZEM CD  Take 1 capsule (240 mg total) by mouth 2 (two) times daily.     latanoprost 0.005 % ophthalmic solution  Commonly known as:  XALATAN     multivitamin per tablet  Take 1 tablet by mouth daily.     tamsulosin 0.4 MG Caps capsule  Commonly known as:  FLOMAX  Take 1 capsule (0.4 mg total) by mouth daily.     vardenafil 20 MG tablet  Commonly known as:  LEVITRA  Take 1 tablet (20 mg total) by mouth daily as needed.           Objective:   Physical Exam BP 138/76 mmHg  Pulse 67  Temp(Src) 98.2 F (36.8 C) (Oral)  Ht 5\' 8"  (1.727 m)  Wt 203 lb 8 oz (92.307 kg)  BMI 30.95 kg/m2  SpO2 97%  General:   Well developed, well nourished . NAD.  Neck:  Full range of motion. Supple. No  thyromegaly , normal carotid pulse HEENT:  Normocephalic . Face symmetric, atraumatic Lungs:  CTA B Normal respiratory effort, no intercostal retractions, no accessory muscle use. Heart: RRR,  no murmur. Distal pulses intact. No pretibial edema bilaterally  Abdomen:  Not distended, soft, non-tender. No rebound or rigidity. No  mass,organomegaly Skin: Exposed areas without rash. Not pale. Not jaundice Neurologic:  alert & oriented X3.  Speech normal, gait appropriate for age and unassisted Psych: Cognition and judgment appear intact.  Cooperative with normal attention span and concentration.  Behavior appropriate. No anxious or depressed appearing. Rectal:  External abnormalities: none. Normal sphincter tone. No rectal masses or tenderness.  Stool brown  Prostate: Prostate gland firm  and smooth, no enlargement, nodularity, tenderness, mass, asymmetry or induration.       Assessment & Plan:   (Patient seen along with   Aletta Edouard, medical student)   Will check CBC today given history of thrombocytopenia  Hypertension: Well-controlled on Cardizem, patient notes ambulatory systolic BPs typically in 140s. No changes to medications. Will check BMP today.   Osteoarthritis: Patient notes increasing pain bilaterally in his knees. Received bilateral cortisone injections from ortho on 01/10/15. Will f/u with ortho for another cortisone injection.  Erectile Dysfunction: Well-controlled with Levitra, patient has no concerns. Will continue current regimen.   Hyperlipidemia: Currently only taking daily baby aspirin. Will check FLP today and re-assess medications pending results.  Glaucoma: Patient has not seen eye doctor recently for h/o glaucoma. Patient advised on importance of following up with eye doctor.

## 2015-05-24 NOTE — Assessment & Plan Note (Addendum)
Td 2015 pneumonia shot 2005 and  2010 prevnar 2015 shingles vaccine 2010  Cscope 6-08  (Dr Carlean Purl) neg but had a polypoid anal lesion, lesion removed by Dr Bubba Camp next Cscope-- 2018   healthy lifestyle--> discussed   Height decreased -->   DEXA 05-2014 normal

## 2015-05-24 NOTE — Assessment & Plan Note (Signed)
Nocturia: Patient has 3-4 years of urinary frequency but recently notes increased urgency as well.  Saw urology multiple years ago and underwent full workup for urinary conditions and diagnosed with BPH and detrusor instability but treatments either did not work or were not well-tolerated. DRE today unremarkable.   Patient not bothered by increased frequency, just by the urinary urgency.  Plan:  Given onset of new symptom, will try Flomax today to help with urgency. Will check UA and PSA. Patient instructed to follow-up in 4 months or sooner if symptoms worsen.

## 2015-06-02 NOTE — Addendum Note (Signed)
Addended by: Wilfrid Lund on: 06/02/2015 10:19 AM   Modules accepted: Orders

## 2015-06-03 NOTE — Addendum Note (Signed)
Addended by: Peggyann Shoals on: 06/03/2015 03:43 PM   Modules accepted: Orders

## 2015-06-05 LAB — URINE CULTURE

## 2015-10-25 ENCOUNTER — Encounter: Payer: Self-pay | Admitting: Internal Medicine

## 2015-10-25 ENCOUNTER — Ambulatory Visit (INDEPENDENT_AMBULATORY_CARE_PROVIDER_SITE_OTHER): Payer: Medicare Other | Admitting: Internal Medicine

## 2015-10-25 VITALS — BP 138/66 | HR 84 | Temp 97.6°F | Ht 68.0 in | Wt 208.4 lb

## 2015-10-25 DIAGNOSIS — I10 Essential (primary) hypertension: Secondary | ICD-10-CM | POA: Diagnosis not present

## 2015-10-25 DIAGNOSIS — Z09 Encounter for follow-up examination after completed treatment for conditions other than malignant neoplasm: Secondary | ICD-10-CM

## 2015-10-25 DIAGNOSIS — R351 Nocturia: Secondary | ICD-10-CM

## 2015-10-25 DIAGNOSIS — F528 Other sexual dysfunction not due to a substance or known physiological condition: Secondary | ICD-10-CM

## 2015-10-25 MED ORDER — LOSARTAN POTASSIUM 50 MG PO TABS: 50.0000 mg | ORAL_TABLET | Freq: Every day | ORAL | Status: DC

## 2015-10-25 MED ORDER — SILDENAFIL CITRATE 20 MG PO TABS: 60.0000 mg | ORAL_TABLET | Freq: Every day | ORAL | Status: DC | PRN

## 2015-10-25 NOTE — Patient Instructions (Addendum)
  Start losartan 50 mg in addition to urology medication to help your blood pressure.   Schedule labs to be done 2 or 3 weeks from today ( BMP hypertension.)  Check the  blood pressure 2 or 3 times a  Week   Be sure your blood pressure is between 110/65 and  145/85.  if it is consistently higher or lower, let us know   Take sildenafil 20 mg: 3 or 4 tablets at night as needed for difficulty with erections   Next visit  for a    physical exam in 6 months, fasting    Please schedule an appointment at the front desk

## 2015-10-25 NOTE — Progress Notes (Signed)
Pre visit review using our clinic review tool, if applicable. No additional management support is needed unless otherwise documented below in the visit note. 

## 2015-10-25 NOTE — Progress Notes (Signed)
Subjective:    Patient ID: Darren Barnes, male    DOB: 09-15-1941, 74 y.o.   MRN: [id number]  DOS:  10/25/2015 Type of visit - description : Follow-up from previous visit Interval history: Nocturia: Started Flomax, urinary frequency at night has decrease (now ~ 4 trips per night instead of 8). Urgency has also decreased to some extent DJD: Continue with local injection to the knee, he is getting some relief. ED: Unable to afford medications. Hypertension: Good compliance with medicines, BPs in the mornings in the 140s, in the afternoons have been consistently in the 160s.   Review of Systems Denies chest pain or difficulty breathing. No lower extremity edema. No dysuria gross hematuria  Past Medical History  Diagnosis Date  . Hyperlipidemia   . Hypertension   . ED (erectile dysfunction)   . Thrombocytopenia ([place])     saw hematology, observe  . Osteoarthritis   . Nocturia     saw urology 2012, was eval, rx vesicare (after several other meds failed)  . OSA on CPAP   . Glaucoma     Past Surgical History  Procedure Laterality Date  . Benign tumor from left arm    . Sinus surgery for benign tumor    . Knee arthroscopy  2004  . Anal polyp resection  2008    Dr Debbora Presto    Social History   Social History  . Marital Status: Married    Spouse Name: N/A  . Number of Children: 3  . Years of Education: N/A   Occupational History  . Retired [employer] [place]     retired   Social History Main Topics  . Smoking status: Never Smoker   . Smokeless tobacco: Never Used  . Alcohol Use: Yes     Comment: 2 per day on average  . Drug Use: No  . Sexual Activity: Not on file   Other Topics Concern  . Not on file   Social History Narrative   Lost one son, 2 living children, 8 g-kids                    Medication List       This list is accurate as of: 10/25/15 11:59 PM.  Always use your most recent med list.               aspirin 81 MG tablet  Take 81 mg by mouth daily.       diltiazem 240 MG 24 hr capsule  Commonly known as:  CARDIZEM CD  Take 1 capsule (240 mg total) by mouth 2 (two) times daily.     latanoprost 0.005 % ophthalmic solution  Commonly known as:  XALATAN     losartan 50 MG tablet  Commonly known as:  COZAAR  Take 1 tablet (50 mg total) by mouth daily.     multivitamin per tablet  Take 1 tablet by mouth daily.     sildenafil 20 MG tablet  Commonly known as:  REVATIO  Take 3-4 tablets (60-80 mg total) by mouth daily as needed.     tamsulosin 0.4 MG Caps capsule  Commonly known as:  FLOMAX  Take 1 capsule (0.4 mg total) by mouth daily.     vardenafil 20 MG tablet  Commonly known as:  LEVITRA  Take 1 tablet (20 mg total) by mouth daily as needed.           Objective:   Physical Exam BP 138/66 mmHg  Pulse  84  Temp(Src) 97.6 F (36.4 C) (Oral)  Ht 5\' 8"  (1.727 m)  Wt 208 lb 6 oz (94.518 kg)  BMI 31.69 kg/m2  SpO2 97% General:   Well developed, well nourished . NAD.  HEENT:  Normocephalic . Face symmetric, atraumatic Lungs:  CTA B Normal respiratory effort, no intercostal retractions, no accessory muscle use. Heart: RRR,  no murmur.  No pretibial edema bilaterally  Skin: Not pale. Not jaundice Neurologic:  alert & oriented X3.  Speech normal, gait appropriate for age and unassisted Psych--  Cognition and judgment appear intact.  Cooperative with normal attention span and concentration.  Behavior appropriate. No anxious or depressed appearing.      Assessment & Plan:   Assessment > HTN- on Cardizem for a while, added losartan 10-2015 Hyperlipidemia ED DJD OSA on CPAP Glaucoma H/o Thrombocytopenia, saw hematology, rx  observation H/o  Nocturia  Saw  Urologist ~2012 underwent full workup >> dx BPH and detrusor instability, failed several medications  Plan: HTN: Ambulatory BP in the afternoon in the 160s. Continue Cardizem, add losartan, BMP in 3 weeks, monitor BPs. See instructions ED: Switch to generic  sildenafil. Nocturia: Modest improvement of symptoms with Flomax, refill medications. Primary care-- Already had a flu shot. RTC 6 months for a physical

## 2015-10-26 DIAGNOSIS — Z09 Encounter for follow-up examination after completed treatment for conditions other than malignant neoplasm: Secondary | ICD-10-CM | POA: Insufficient documentation

## 2015-10-26 NOTE — Assessment & Plan Note (Signed)
HTN: Ambulatory BP in the afternoon in the 160s. Continue Cardizem, add losartan, BMP in 3 weeks, monitor BPs. See instructions ED: Switch to generic sildenafil. Nocturia: Modest improvement of symptoms with Flomax, refill medications. Primary care-- Already had a flu shot. RTC 6 months for a physical

## 2015-11-15 ENCOUNTER — Other Ambulatory Visit (INDEPENDENT_AMBULATORY_CARE_PROVIDER_SITE_OTHER): Payer: Medicare Other

## 2015-11-15 ENCOUNTER — Encounter: Payer: Self-pay | Admitting: Internal Medicine

## 2015-11-15 ENCOUNTER — Other Ambulatory Visit: Payer: Self-pay

## 2015-11-15 DIAGNOSIS — I10 Essential (primary) hypertension: Secondary | ICD-10-CM | POA: Diagnosis not present

## 2015-11-15 LAB — BASIC METABOLIC PANEL
BUN: 14 mg/dL (ref 6–23)
CHLORIDE: 107 meq/L (ref 96–112)
CO2: 29 mEq/L (ref 19–32)
Calcium: 9.1 mg/dL (ref 8.4–10.5)
Creatinine, Ser: 1.03 mg/dL (ref 0.40–1.50)
GFR: 90.13 mL/min (ref >60.00)
GLUCOSE: 125 mg/dL — AB (ref 70–99)
POTASSIUM: 4.2 meq/L (ref 3.5–5.1)
SODIUM: 141 meq/L (ref 135–145)

## 2015-11-15 MED ORDER — TAMSULOSIN HCL 0.4 MG PO CAPS: 0.4000 mg | ORAL_CAPSULE | Freq: Every day | ORAL | Status: DC

## 2015-11-15 MED ORDER — LOSARTAN POTASSIUM 50 MG PO TABS: 50.0000 mg | ORAL_TABLET | Freq: Every day | ORAL | Status: DC

## 2015-11-24 ENCOUNTER — Encounter: Payer: Self-pay | Admitting: Internal Medicine

## 2015-11-24 DIAGNOSIS — Z0100 Encounter for examination of eyes and vision without abnormal findings: Secondary | ICD-10-CM

## 2015-11-24 NOTE — Telephone Encounter (Signed)
Referral placed to Dr. Venetia Maxon.

## 2015-12-25 HISTORY — PX: OTHER SURGICAL HISTORY: SHX169

## 2016-03-09 ENCOUNTER — Ambulatory Visit (INDEPENDENT_AMBULATORY_CARE_PROVIDER_SITE_OTHER): Payer: Medicare Other | Admitting: Internal Medicine

## 2016-03-09 ENCOUNTER — Telehealth: Payer: Self-pay | Admitting: Internal Medicine

## 2016-03-09 ENCOUNTER — Encounter: Payer: Self-pay | Admitting: Internal Medicine

## 2016-03-09 VITALS — BP 132/76 | HR 53 | Temp 97.5°F | Ht 68.0 in | Wt 212.4 lb

## 2016-03-09 DIAGNOSIS — M545 Low back pain, unspecified: Secondary | ICD-10-CM

## 2016-03-09 DIAGNOSIS — I10 Essential (primary) hypertension: Secondary | ICD-10-CM | POA: Diagnosis not present

## 2016-03-09 DIAGNOSIS — Z09 Encounter for follow-up examination after completed treatment for conditions other than malignant neoplasm: Secondary | ICD-10-CM

## 2016-03-09 LAB — POCT URINALYSIS DIPSTICK
Bilirubin, UA: NEGATIVE
Glucose, UA: NEGATIVE
KETONES UA: NEGATIVE
Nitrite, UA: POSITIVE
PROTEIN UA: NEGATIVE
RBC UA: NEGATIVE
SPEC GRAV UA: 1.015
Urobilinogen, UA: 1
pH, UA: 6.5

## 2016-03-09 MED ORDER — CIPROFLOXACIN HCL 500 MG PO TABS: 500.0000 mg | ORAL_TABLET | Freq: Two times a day (BID) | ORAL | Status: DC

## 2016-03-09 NOTE — Telephone Encounter (Signed)
Rx resent.

## 2016-03-09 NOTE — Progress Notes (Signed)
Pre visit review using our clinic review tool, if applicable. No additional management support is needed unless otherwise documented below in the visit note. 

## 2016-03-09 NOTE — Telephone Encounter (Signed)
Pharmacy: [id number] - [place], [address] AT [place] [address]  Reason for call: [facility] is out of cipro and won't have til Monday - please resend to [facility]

## 2016-03-09 NOTE — Patient Instructions (Signed)
Drink plenty of fluids  Start ciprofloxacin 1 tablet twice a day  Tylenol 500 mg one or 2 tablets 3 times a day as needed  Call anytime if you have fever, chills, severe symptoms.

## 2016-03-09 NOTE — Progress Notes (Signed)
Subjective:    Patient ID: Darren Barnes, male    DOB: August 14, 1941, 75 y.o.   MRN: [id number]  DOS:  03/09/2016 Type of visit - description : Acute visit Interval history: 2 weeks history of steady pain at the right lower back, not severe, worse when he gets up from a chair;  no rash, no injury or fall. Denies any lower extremity paresthesias. Has not taken any particular meds for the sx  For the last 3 weeks, thinks  his urine is cloudy and has a particular odor.   Review of Systems Denies fever chills No abdominal pain or flank pain No dysuria, gross hematuria or difficulty urinating.   Past Medical History  Diagnosis Date  . Hyperlipidemia   . Hypertension   . ED (erectile dysfunction)   . Thrombocytopenia ([place])     saw hematology, observe  . Osteoarthritis   . Nocturia     saw urology 2012, was eval, rx vesicare (after several other meds failed)  . OSA on CPAP   . Glaucoma     Past Surgical History  Procedure Laterality Date  . Benign tumor from left arm    . Sinus surgery for benign tumor    . Knee arthroscopy  2004  . Anal polyp resection  2008    Dr Debbora Presto    Social History   Social History  . Marital Status: Married    Spouse Name: N/A  . Number of Children: 3  . Years of Education: N/A   Occupational History  . Retired [employer] of [place]     retired   Social History Main Topics  . Smoking status: Never Smoker   . Smokeless tobacco: Never Used  . Alcohol Use: Yes     Comment: 2 per day on average  . Drug Use: No  . Sexual Activity: Not on file   Other Topics Concern  . Not on file   Social History Narrative   Lost one son, 2 living children, 8 g-kids                    Medication List       This list is accurate as of: 03/09/16 11:59 PM.  Always use your most recent med list.               aspirin 81 MG tablet  Take 81 mg by mouth daily.     ciprofloxacin 500 MG tablet  Commonly known as:  CIPRO  Take 1 tablet (500 mg total) by  mouth 2 (two) times daily.     COMBIGAN 0.2-0.5 % ophthalmic solution  Generic drug:  brimonidine-timolol  Place 1 drop into both eyes 2 (two) times daily.     diltiazem 240 MG 24 hr capsule  Commonly known as:  CARDIZEM CD  Take 1 capsule (240 mg total) by mouth 2 (two) times daily.     losartan 50 MG tablet  Commonly known as:  COZAAR  Take 1 tablet (50 mg total) by mouth daily.     LUMIGAN 0.01 % Soln  Generic drug:  bimatoprost  Place 1 drop into both eyes every evening.     multivitamin per tablet  Take 1 tablet by mouth daily.     tamsulosin 0.4 MG Caps capsule  Commonly known as:  FLOMAX  Take 1 capsule (0.4 mg total) by mouth daily.           Objective:   Physical Exam BP 132/76 mmHg  Pulse 53  Temp(Src) 97.5 F (36.4 C) (Oral)  Ht 5\' 8"  (1.727 m)  Wt 212 lb 6 oz (96.333 kg)  BMI 32.30 kg/m2  SpO2 97% General:   Well developed, well nourished . NAD.  HEENT:  Normocephalic . Face symmetric, atraumatic Abdomen:  Not distended, soft, non-tender. No rebound or rigidity. No CVA tenderness MSK: Slightly TTP at the distal right paraspinal thoracic muscles. Skin: Not pale. Not jaundice ; no rash at the abdomen or flanks. Neurologic:  alert & oriented X3.  Speech normal, gait appropriate for age and unassisted DTRs symmetric, straight leg test negative. Psych--  Cognition and judgment appear intact.  Cooperative with normal attention span and concentration.  Behavior appropriate. No anxious or depressed appearing.    Assessment & Plan:   Assessment > HTN- on Cardizem for a while, added losartan 10-2015 Hyperlipidemia ED DJD OSA on CPAP Glaucoma H/o Thrombocytopenia, saw hematology, rx  observation H/o  Nocturia  Saw  Urologist ~2012 underwent full workup >> dx BPH and detrusor instability, failed several medications  Plan: Back pain: Back pain sounds MSK in origin however he does have some urinary symptoms and the U dip showed trace   leukocytes. Recommend rest, Tylenol, send a UA, urine culture, start Cipro pending culture. HTN: Well-controlled, started losartan, subsequent BMP normal RTC 05-2016 as schedule

## 2016-03-10 LAB — URINALYSIS, MICROSCOPIC ONLY
Casts: NONE SEEN [LPF]
Crystals: NONE SEEN [HPF]
RBC / HPF: NONE SEEN RBC/HPF (ref <=2)
SQUAMOUS EPITHELIAL / LPF: NONE SEEN [HPF] (ref <=5)
YEAST: NONE SEEN [HPF]

## 2016-03-10 LAB — URINALYSIS, ROUTINE W REFLEX MICROSCOPIC
BILIRUBIN URINE: NEGATIVE
GLUCOSE, UA: NEGATIVE
Hgb urine dipstick: NEGATIVE
Ketones, ur: NEGATIVE
Nitrite: POSITIVE — AB
PH: 7 (ref 5.0–8.0)
PROTEIN: NEGATIVE
SPECIFIC GRAVITY, URINE: 1.016 (ref 1.001–1.035)

## 2016-03-10 NOTE — Assessment & Plan Note (Signed)
Back pain: Back pain sounds MSK in origin however he does have some urinary symptoms and the U dip showed trace  leukocytes. Recommend rest, Tylenol, send a UA, urine culture, start Cipro pending culture. HTN: Well-controlled, started losartan, subsequent BMP normal RTC 05-2016 as schedule

## 2016-03-11 LAB — URINE CULTURE: Colony Count: >=100000

## 2016-05-24 ENCOUNTER — Telehealth: Payer: Self-pay

## 2016-05-24 NOTE — Telephone Encounter (Signed)
Left message for patient to return my call.

## 2016-05-25 ENCOUNTER — Ambulatory Visit (INDEPENDENT_AMBULATORY_CARE_PROVIDER_SITE_OTHER): Payer: Medicare Other | Admitting: Internal Medicine

## 2016-05-25 ENCOUNTER — Encounter: Payer: Self-pay | Admitting: Internal Medicine

## 2016-05-25 VITALS — BP 124/72 | HR 80 | Temp 98.1°F | Ht 68.0 in | Wt 208.5 lb

## 2016-05-25 DIAGNOSIS — M159 Polyosteoarthritis, unspecified: Secondary | ICD-10-CM

## 2016-05-25 DIAGNOSIS — Z0000 Encounter for general adult medical examination without abnormal findings: Secondary | ICD-10-CM

## 2016-05-25 DIAGNOSIS — R399 Unspecified symptoms and signs involving the genitourinary system: Secondary | ICD-10-CM

## 2016-05-25 DIAGNOSIS — N390 Urinary tract infection, site not specified: Secondary | ICD-10-CM

## 2016-05-25 DIAGNOSIS — D696 Thrombocytopenia, unspecified: Secondary | ICD-10-CM

## 2016-05-25 DIAGNOSIS — J309 Allergic rhinitis, unspecified: Secondary | ICD-10-CM

## 2016-05-25 DIAGNOSIS — M150 Primary generalized (osteo)arthritis: Secondary | ICD-10-CM

## 2016-05-25 DIAGNOSIS — I10 Essential (primary) hypertension: Secondary | ICD-10-CM | POA: Diagnosis not present

## 2016-05-25 DIAGNOSIS — E785 Hyperlipidemia, unspecified: Secondary | ICD-10-CM | POA: Diagnosis not present

## 2016-05-25 LAB — CBC WITH DIFFERENTIAL/PLATELET
BASOS PCT: 0 % (ref 0.0–3.0)
Basophils Absolute: 0 10*3/uL (ref 0.0–0.1)
EOS ABS: 0.1 10*3/uL (ref 0.0–0.7)
EOS PCT: 0.5 % (ref 0.0–5.0)
HEMATOCRIT: 46 % (ref 39.0–52.0)
HEMOGLOBIN: 15.3 g/dL (ref 13.0–17.0)
LYMPHS PCT: 9.3 % — AB (ref 12.0–46.0)
Lymphs Abs: 1.1 10*3/uL (ref 0.7–4.0)
MCHC: 33.3 g/dL (ref 30.0–36.0)
MCV: 85.8 fl (ref 78.0–100.0)
Monocytes Absolute: 1.1 10*3/uL — ABNORMAL HIGH (ref 0.1–1.0)
Monocytes Relative: 9.7 % (ref 3.0–12.0)
NEUTROS ABS: 9.4 10*3/uL — AB (ref 1.4–7.7)
Neutrophils Relative %: 80.5 % — ABNORMAL HIGH (ref 43.0–77.0)
PLATELETS: 102 10*3/uL — AB (ref 150.0–400.0)
RBC: 5.37 Mil/uL (ref 4.22–5.81)
RDW: 13 % (ref 11.5–15.5)
WBC: 11.7 10*3/uL — AB (ref 4.0–10.5)

## 2016-05-25 LAB — LIPID PANEL
CHOLESTEROL: 219 mg/dL — AB (ref 0–200)
HDL: 54.6 mg/dL (ref >39.00)
LDL CALC: 148 mg/dL — AB (ref 0–99)
NonHDL: 164.88
TRIGLYCERIDES: 84 mg/dL (ref 0.0–149.0)
Total CHOL/HDL Ratio: 4
VLDL: 16.8 mg/dL (ref 0.0–40.0)

## 2016-05-25 LAB — URINALYSIS, ROUTINE W REFLEX MICROSCOPIC
Bilirubin Urine: NEGATIVE
HGB URINE DIPSTICK: NEGATIVE
Ketones, ur: NEGATIVE
NITRITE: NEGATIVE
RBC / HPF: NONE SEEN (ref 0–?)
SPECIFIC GRAVITY, URINE: 1.01 (ref 1.000–1.030)
Total Protein, Urine: NEGATIVE
Urine Glucose: NEGATIVE
Urobilinogen, UA: 0.2 (ref 0.0–1.0)
pH: 6.5 (ref 5.0–8.0)

## 2016-05-25 LAB — BASIC METABOLIC PANEL
BUN: 16 mg/dL (ref 6–23)
CALCIUM: 9.4 mg/dL (ref 8.4–10.5)
CO2: 23 mEq/L (ref 19–32)
Chloride: 103 mEq/L (ref 96–112)
Creatinine, Ser: 1.1 mg/dL (ref 0.40–1.50)
GFR: 83.42 mL/min (ref >60.00)
GLUCOSE: 70 mg/dL (ref 70–99)
POTASSIUM: 4 meq/L (ref 3.5–5.1)
SODIUM: 140 meq/L (ref 135–145)

## 2016-05-25 LAB — PSA: PSA: 2.4 ng/mL (ref 0.10–4.00)

## 2016-05-25 NOTE — Telephone Encounter (Signed)
Left message for patient to return pre-visit call

## 2016-05-25 NOTE — Assessment & Plan Note (Addendum)
Td 2015 pneumonia shot 2005 and  2010 prevnar 2015 shingles vaccine 2010  Cscope 6-08  (Dr Carlean Purl) neg but had a polypoid anal lesion, lesion removed by Dr Bubba Camp next Cscope-- 2018  Height decreased -->   DEXA 05-2014 normal   Diet exercise discussed

## 2016-05-25 NOTE — Progress Notes (Signed)
Pre visit review using our clinic review tool, if applicable. No additional management support is needed unless otherwise documented below in the visit note. 

## 2016-05-25 NOTE — Assessment & Plan Note (Signed)
HTN: Continue Cardizem, losartan, well-controlled, check a BMP Hyperlipidemia: On diet control. DJD: s/p knee injections, mild symptoms at this point Glaucoma: On eyedrops per ophthalmology LUTS: Continue with urinary symptoms mostly nocturia. Last DRE normal a year ago, recently had a UTI. Offered referral to urology: Declined. Will check a PSA, UA urine culture. Thrombocytopenia: Check a CBC Abdominal wall mass: Likely lipoma, observation for now. Rhinitis : C/o chronic nasal discharge, had surgery few years ago with Dr. Warren Danes for a benign condition. Plans to see him if sx don't respond to Flonase  RTC 6 months

## 2016-05-25 NOTE — Patient Instructions (Signed)
Get your blood work before you leave   Next visit in 6 months    Fall Prevention and [place] cause injuries and can affect all age groups. It is possible to use preventive measures to significantly decrease the likelihood of falls. There are many simple measures which can make your home safer and prevent falls. OUTDOORS  Repair cracks and edges of walkways and driveways.  Remove high doorway thresholds.  Trim shrubbery on the main path into your home.  Have good outside lighting.  Clear walkways of tools, rocks, debris, and clutter.  Check that handrails are not broken and are securely fastened. Both sides of steps should have handrails.  Have leaves, snow, and ice cleared regularly.  Use sand or salt on walkways during winter months.  In the garage, clean up grease or oil spills. BATHROOM  Install night lights.  Install grab bars by the toilet and in the tub and shower.  Use non-skid mats or decals in the tub or shower.  Place a plastic non-slip stool in the shower to sit on, if needed.  Keep floors dry and clean up all water on the floor immediately.  Remove soap buildup in the tub or shower on a regular basis.  Secure bath mats with non-slip, double-sided rug tape.  Remove throw rugs and tripping hazards from the floors. BEDROOMS  Install night lights.  Make sure a bedside light is easy to reach.  Do not use oversized bedding.  Keep a telephone by your bedside.  Have a firm chair with side arms to use for getting dressed.  Remove throw rugs and tripping hazards from the floor. KITCHEN  Keep handles on pots and pans turned toward the center of the stove. Use back burners when possible.  Clean up spills quickly and allow time for drying.  Avoid walking on wet floors.  Avoid hot utensils and knives.  Position shelves so they are not too high or low.  Place commonly used objects within easy reach.  If necessary, use a sturdy step stool  with a grab bar when reaching.  Keep electrical cables out of the way.  Do not use floor polish or wax that makes floors slippery. If you must use wax, use non-skid floor wax.  Remove throw rugs and tripping hazards from the floor. STAIRWAYS  Never leave objects on stairs.  Place handrails on both sides of stairways and use them. Fix any loose handrails. Make sure handrails on both sides of the stairways are as long as the stairs.  Check carpeting to make sure it is firmly attached along stairs. Make repairs to worn or loose carpet promptly.  Avoid placing throw rugs at the top or bottom of stairways, or properly secure the rug with carpet tape to prevent slippage. Get rid of throw rugs, if possible.  Have an electrician put in a light switch at the top and bottom of the stairs. OTHER FALL PREVENTION TIPS  Wear low-heel or rubber-soled shoes that are supportive and fit well. Wear closed toe shoes.  When using a stepladder, make sure it is fully opened and both spreaders are firmly locked. Do not climb a closed stepladder.  Add color or contrast paint or tape to grab bars and handrails in your home. Place contrasting color strips on first and last steps.  Learn and use mobility aids as needed. Install an electrical emergency response system.  Turn on lights to avoid dark areas. Replace light bulbs that burn out immediately. Get  Get light switches that glow.  Arrange furniture to create clear pathways. Keep furniture in the same place.  Firmly attach carpet with non-skid or double-sided tape.  Eliminate uneven floor surfaces.  Select a carpet pattern that does not visually hide the edge of steps.  Be aware of all pets. OTHER HOME SAFETY TIPS  Set the water temperature for 120 F (48.8 C).  Keep emergency numbers on or near the telephone.  Keep smoke detectors on every level of the home and near sleeping areas. Document Released: 11/30/2002 Document Revised: 06/10/2012  Document Reviewed: 02/29/2012 ExitCare Patient Information 2015 [employer]. This information is not intended to replace advice given to you by your health care provider. Make sure you discuss any questions you have with your health care provider.   Preventive Care for Adults Ages 65 and over  Blood pressure check.** / Every 1 to 2 years.  Lipid and cholesterol check.**/ Every 5 years beginning at age 20.  Lung cancer screening. / Every year if you are aged 55-80 years and have a 30-pack-year history of smoking and currently smoke or have quit within the past 15 years. Yearly screening is stopped once you have quit smoking for at least 15 years or develop a health problem that would prevent you from having lung cancer treatment.  Fecal occult blood test (FOBT) of stool. / Every year beginning at age 50 and continuing until age 75. You may not have to do this test if you get a colonoscopy every 10 years.  Flexible sigmoidoscopy** or colonoscopy.** / Every 5 years for a flexible sigmoidoscopy or every 10 years for a colonoscopy beginning at age 50 and continuing until age 75.  Hepatitis C blood test.** / For all people born from 1945 through 1965 and any individual with known risks for hepatitis C.  Abdominal aortic aneurysm (AAA) screening.** / A one-time screening for ages 65 to 75 years who are current or former smokers.  Skin self-exam. / Monthly.  Influenza vaccine. / Every year.  Tetanus, diphtheria, and acellular pertussis (Tdap/Td) vaccine.** / 1 dose of Td every 10 years.  Varicella vaccine.** / Consult your health care provider.  Zoster vaccine.** / 1 dose for adults aged 60 years or older.  Pneumococcal 13-valent conjugate (PCV13) vaccine.** / Consult your health care provider.  Pneumococcal polysaccharide (PPSV23) vaccine.** / 1 dose for all adults aged 65 years and older.  Meningococcal vaccine.** / Consult your health care provider.  Hepatitis A vaccine.** /  Consult your health care provider.  Hepatitis B vaccine.** / Consult your health care provider.  Haemophilus influenzae type b (Hib) vaccine.** / Consult your health care provider. **Family history and personal history of risk and conditions may change your health care provider's recommendations. Document Released: 02/05/2002 Document Revised: 12/15/2013 Document Reviewed: 05/07/2011 ExitCare Patient Information 2015 Exit[employer]. This information is not intended to replace advice given to you by your health care provider. Make sure you discuss any questions you have with your health care provider.   

## 2016-05-25 NOTE — Progress Notes (Signed)
Subjective:    Patient ID: Darren Barnes, male    DOB: 1941/06/20, 75 y.o.   MRN: [id number]  DOS:  05/25/2016 Type of visit - description : CPX We also discussed other issues HTN: Good med compliance, ambulatory BPs within normal Preglaucoma: Has seen his eye doctor regularly LUTS and a UTI: Status post Cipro, symptoms are back to baseline, continue with nocturia 4 times every night. Rhinitis, on and off watery nasal discharge for a while. Flonase helps. DJD: Status post local injections, some improvement.    Review of Systems Constitutional: No fever. No chills. No unexplained wt changes. No unusual sweats  HEENT: No dental problems, no ear discharge, no facial swelling, no voice changes. No eye discharge, no eye  redness , no  intolerance to light . Denies nosebleed or sneezing Respiratory: No wheezing , no  difficulty breathing. No cough , no mucus production  Cardiovascular: No CP, no leg swelling , no  Palpitations  GI: no nausea, no vomiting, no diarrhea , no  abdominal pain.  No blood in the stools. No dysphagia, no odynophagia    Endocrine: No polyphagia, no polyuria , no polydipsia  GU:  Denies actual difficulty urinating, gross hematuria. Urgency very mild, helped by Flomax.  Musculoskeletal: No joint swellings or unusual aches or pains  Skin: No change in the color of the skin, palor , no  Rash  Allergic, immunologic: No environmental allergies , no  food allergies  Neurological: No dizziness no  syncope. No headaches. No diplopia, no slurred, no slurred speech, no motor deficits, no facial  Numbness  Hematological: No enlarged lymph nodes, no easy bruising , no unusual bleedings  Psychiatry: No suicidal ideas, no hallucinations, no beavior problems, no confusion.  No unusual/severe anxiety, no depression   Past Medical History  Diagnosis Date  . Hyperlipidemia   . Hypertension   . ED (erectile dysfunction)   . Thrombocytopenia ([place])     saw hematology,  observe  . Osteoarthritis   . Nocturia     saw urology 2012, was eval, rx vesicare (after several other meds failed)  . OSA on CPAP   . Glaucoma     Past Surgical History  Procedure Laterality Date  . Benign tumor from left arm    . Sinus surgery for benign tumor    . Knee arthroscopy  2004  . Anal polyp resection  2008    Dr Debbora Presto    Social History   Social History  . Marital Status: Married    Spouse Name: N/A  . Number of Children: 3  . Years of Education: N/A   Occupational History  . Retired [employer] of [place]     retired   Social History Main Topics  . Smoking status: Never Smoker   . Smokeless tobacco: Never Used  . Alcohol Use: Yes     Comment: 2 per day on average  . Drug Use: No  . Sexual Activity: Not on file   Other Topics Concern  . Not on file   Social History Narrative   Lives w/ wife   Lost one son, 2 living children, 8 g-kids                 Family History  Problem Relation Age of Onset  . Coronary artery disease Father     MI at 37  . Hypertension Mother     Jerilynn Mages and F   . Diabetes Mother   . Stroke Mother 4  .  Colon cancer Neg Hx   . Prostate cancer Neg Hx        Medication List       This list is accurate as of: 05/25/16  4:01 PM.  Always use your most recent med list.               aspirin 81 MG tablet  Take 81 mg by mouth daily.     COMBIGAN 0.2-0.5 % ophthalmic solution  Generic drug:  brimonidine-timolol  Place 1 drop into both eyes 2 (two) times daily.     diltiazem 240 MG 24 hr capsule  Commonly known as:  CARDIZEM CD  Take 1 capsule (240 mg total) by mouth 2 (two) times daily.     losartan 50 MG tablet  Commonly known as:  COZAAR  Take 1 tablet (50 mg total) by mouth daily.     LUMIGAN 0.01 % Soln  Generic drug:  bimatoprost  Place 1 drop into both eyes every evening.     multivitamin per tablet  Take 1 tablet by mouth daily.     tamsulosin 0.4 MG Caps capsule  Commonly known as:  FLOMAX  Take 1 capsule  (0.4 mg total) by mouth daily.           Objective:   Physical Exam  Abdominal:     BP 124/72 mmHg  Pulse 80  Temp(Src) 98.1 F (36.7 C) (Oral)  Ht 5\' 8"  (1.727 m)  Wt 208 lb 8 oz (94.575 kg)  BMI 31.71 kg/m2  SpO2 94% General:   Well developed, well nourished . NAD.  HEENT:  Normocephalic . Face symmetric, atraumatic Lungs:  CTA B Normal respiratory effort, no intercostal retractions, no accessory muscle use. Heart: RRR,  no murmur.  no pretibial edema bilaterally  Abdomen:  Not distended, soft, non-tender. No rebound or rigidity.  Skin: Not pale. Not jaundice Neurologic:  alert & oriented X3.  Speech normal, gait appropriate for age and unassisted Psych--  Cognition and judgment appear intact.  Cooperative with normal attention span and concentration.  Behavior appropriate. No anxious or depressed appearing.    Assessment & Plan:   Assessment > HTN- on Cardizem for a while, added losartan 10-2015 Hyperlipidemia ED DJD- s/p knee injections (flexogenic) ~ 2016 OSA on CPAP Glaucoma Abdominal wall mass see physical exam 05-25-2016 H/o Thrombocytopenia, saw hematology, rx  observation H/o LUTS- Nocturia  Saw  Urologist ~2012 underwent full workup >> dx BPH and detrusor instability, failed several medications  PLAN:  HTN: Continue Cardizem, losartan, well-controlled, check a BMP Hyperlipidemia: On diet control. DJD: s/p knee injections, mild symptoms at this point Glaucoma: On eyedrops per ophthalmology LUTS: Continue with urinary symptoms mostly nocturia. Last DRE normal a year ago, recently had a UTI. Offered referral to urology: Declined. Will check a PSA, UA urine culture. Thrombocytopenia: Check a CBC Abdominal wall mass: Likely lipoma, observation for now. Rhinitis : C/o chronic nasal discharge, had surgery few years ago with Dr. Warren Danes for a benign condition. Plans to see him if sx don't respond to Flonase  RTC 6 months

## 2016-05-27 LAB — URINE CULTURE: Colony Count: >=100000

## 2016-05-30 MED ORDER — SULFAMETHOXAZOLE-TRIMETHOPRIM 800-160 MG PO TABS: 1.0000 | ORAL_TABLET | Freq: Two times a day (BID) | ORAL | Status: DC

## 2016-05-30 NOTE — Addendum Note (Signed)
Addended byDamita Dunnings D on: 05/30/2016 09:06 AM   Modules accepted: Orders

## 2016-06-15 ENCOUNTER — Other Ambulatory Visit: Payer: Self-pay | Admitting: Internal Medicine

## 2016-07-12 ENCOUNTER — Other Ambulatory Visit: Payer: Self-pay | Admitting: Internal Medicine

## 2016-08-10 ENCOUNTER — Other Ambulatory Visit: Payer: Self-pay | Admitting: Internal Medicine

## 2016-09-24 ENCOUNTER — Encounter: Payer: Self-pay | Admitting: Internal Medicine

## 2016-11-26 ENCOUNTER — Encounter: Payer: Self-pay | Admitting: Internal Medicine

## 2016-11-26 ENCOUNTER — Ambulatory Visit (INDEPENDENT_AMBULATORY_CARE_PROVIDER_SITE_OTHER): Payer: Medicare Other | Admitting: Internal Medicine

## 2016-11-26 VITALS — BP 124/74 | HR 71 | Temp 98.1°F | Resp 14 | Ht 68.0 in | Wt 207.5 lb

## 2016-11-26 DIAGNOSIS — N390 Urinary tract infection, site not specified: Secondary | ICD-10-CM

## 2016-11-26 DIAGNOSIS — M150 Primary generalized (osteo)arthritis: Secondary | ICD-10-CM | POA: Diagnosis not present

## 2016-11-26 DIAGNOSIS — M159 Polyosteoarthritis, unspecified: Secondary | ICD-10-CM

## 2016-11-26 DIAGNOSIS — R399 Unspecified symptoms and signs involving the genitourinary system: Secondary | ICD-10-CM

## 2016-11-26 DIAGNOSIS — I10 Essential (primary) hypertension: Secondary | ICD-10-CM | POA: Diagnosis not present

## 2016-11-26 LAB — URINALYSIS, ROUTINE W REFLEX MICROSCOPIC
BILIRUBIN URINE: NEGATIVE
Hgb urine dipstick: NEGATIVE
Ketones, ur: NEGATIVE
NITRITE: POSITIVE — AB
PH: 6 (ref 5.0–8.0)
RBC / HPF: NONE SEEN (ref 0–?)
Specific Gravity, Urine: 1.01 (ref 1.000–1.030)
TOTAL PROTEIN, URINE-UPE24: NEGATIVE
URINE GLUCOSE: NEGATIVE
UROBILINOGEN UA: 0.2 (ref 0.0–1.0)

## 2016-11-26 LAB — BASIC METABOLIC PANEL
BUN: 21 mg/dL (ref 6–23)
CHLORIDE: 107 meq/L (ref 96–112)
CO2: 27 mEq/L (ref 19–32)
Calcium: 9.1 mg/dL (ref 8.4–10.5)
Creatinine, Ser: 1.26 mg/dL (ref 0.40–1.50)
GFR: 71.23 mL/min (ref >60.00)
Glucose, Bld: 83 mg/dL (ref 70–99)
POTASSIUM: 4 meq/L (ref 3.5–5.1)
Sodium: 142 mEq/L (ref 135–145)

## 2016-11-26 NOTE — Patient Instructions (Signed)
GO TO THE LAB : Get the blood work     GO TO THE FRONT DESK Schedule your next appointment for a  Physical by 05-2017    Check the  blood pressure 2   times a month   Be sure your blood pressure is between 110/65 and  145/85. If it is consistently higher or lower, let me know

## 2016-11-26 NOTE — Progress Notes (Signed)
Pre visit review using our clinic review tool, if applicable. No additional management support is needed unless otherwise documented below in the visit note. 

## 2016-11-26 NOTE — Telephone Encounter (Signed)
Please reach out to this patient to schedule CPE

## 2016-11-26 NOTE — Assessment & Plan Note (Signed)
HTN: Check a BMP, continue Cardizem and losartan, no recent ambulatory BPs. BP today is very good. UTI: After the last visit, a UCX returned +, s/p  Bactrim but decided not to pursue urological eval. Currently asx. Will check a UA and urine culture, if positive is will ask again to see urology. L UTS: No sx at this point DJD: Knee pain improved however still have some difficult time walking long distances, handicap  parking permit signed. RTC CPX 05-2017

## 2016-11-26 NOTE — Progress Notes (Signed)
Subjective:    Patient ID: Darren Barnes, male    DOB: 02-Nov-1941, 75 y.o.   MRN: [id number]  DOS:  11/26/2016 Type of visit - description : rov Interval history: HTN: Good medication compliance, no recent ambulatory BPs DJD: Mostly in the knees, pain has improved, he still has a difficult time walking and request temporary parking permit. UTI: Status post antibiotics, asymptomatic.   Review of Systems Denies chest pain, difficulty breathing or edema No dysuria, gross hematuria difficulty urinating  Past Medical History:  Diagnosis Date  . ED (erectile dysfunction)   . Glaucoma   . Hyperlipidemia   . Hypertension   . Nocturia    saw urology 2012, was eval, rx vesicare (after several other meds failed)  . OSA on CPAP   . Osteoarthritis   . Thrombocytopenia ([place])    saw hematology, observe    Past Surgical History:  Procedure Laterality Date  . anal polyp resection  2008   Dr Debbora Presto  . Benign tumor from Left arm    . KNEE ARTHROSCOPY  2004  . Sinus surgery for benign tumor      Social History   Social History  . Marital status: Married    Spouse name: N/A  . Number of children: 3  . Years of education: N/A   Occupational History  . Retired [employer] of [place]     retired   Social History Main Topics  . Smoking status: Never Smoker  . Smokeless tobacco: Never Used  . Alcohol use Yes     Comment: 2 per day on average  . Drug use: No  . Sexual activity: Not on file   Other Topics Concern  . Not on file   Social History Narrative   Lives w/ wife   Lost one son, 2 living children, 8 g-kids                    Medication List       Accurate as of 11/26/16  9:47 PM. Always use your most recent med list.          aspirin 81 MG tablet Take 81 mg by mouth daily.   COMBIGAN 0.2-0.5 % ophthalmic solution Generic drug:  brimonidine-timolol Place 1 drop into both eyes 2 (two) times daily.   diltiazem 240 MG 24 hr capsule Commonly known as:  CARDIZEM  CD Take 1 capsule (240 mg total) by mouth 2 (two) times daily.   losartan 50 MG tablet Commonly known as:  COZAAR Take 1 tablet (50 mg total) by mouth daily.   LUMIGAN 0.01 % Soln Generic drug:  bimatoprost Place 1 drop into both eyes every evening.   multivitamin per tablet Take 1 tablet by mouth daily.   tamsulosin 0.4 MG Caps capsule Commonly known as:  FLOMAX Take 1 capsule (0.4 mg total) by mouth daily.          Objective:   Physical Exam BP 124/74 (BP Location: Left Arm, Patient Position: Sitting, Cuff Size: Normal)   Pulse 71   Temp 98.1 F (36.7 C) (Oral)   Resp 14   Ht 5\' 8"  (1.727 m)   Wt 207 lb 8 oz (94.1 kg)   SpO2 97%   BMI 31.55 kg/m  General:   Well developed, well nourished . NAD.  HEENT:  Normocephalic . Face symmetric, atraumatic Lungs:  CTA B Normal respiratory effort, no intercostal retractions, no accessory muscle use. Heart: RRR,  no murmur.  No pretibial  edema bilaterally  Skin: Not pale. Not jaundice Neurologic:  alert & oriented X3.  Speech normal, gait appropriate for age and unassisted Psych--  Cognition and judgment appear intact.  Cooperative with normal attention span and concentration.  Behavior appropriate. No anxious or depressed appearing.      Assessment & Plan:   Assessment > HTN- on Cardizem for a while, added losartan 10-2015 Hyperlipidemia ED DJD- s/p knee injections (flexogenic) ~ 2016 OSA on CPAP Glaucoma Abdominal wall mass see physical exam 05-25-2016 H/o Thrombocytopenia, saw hematology, rx  observation H/o LUTS- Nocturia  Saw  Urologist ~2012 underwent full workup >> dx BPH and detrusor instability, failed several medications  PLAN: HTN: Check a BMP, continue Cardizem and losartan, no recent ambulatory BPs. BP today is very good. UTI: After the last visit, a UCX returned +, s/p  Bactrim but decided not to pursue urological eval. Currently asx. Will check a UA and urine culture, if positive is will ask  again to see urology. L UTS: No sx at this point DJD: Knee pain improved however still have some difficult time walking long distances, handicap  parking permit signed. RTC CPX 05-2017

## 2016-11-28 LAB — URINE CULTURE

## 2016-12-01 ENCOUNTER — Encounter: Payer: Self-pay | Admitting: Internal Medicine

## 2016-12-03 ENCOUNTER — Other Ambulatory Visit: Payer: Self-pay | Admitting: Internal Medicine

## 2016-12-03 MED ORDER — SULFAMETHOXAZOLE-TRIMETHOPRIM 800-160 MG PO TABS: 1.0000 | ORAL_TABLET | Freq: Two times a day (BID) | ORAL | 0 refills | Status: DC

## 2016-12-03 NOTE — Addendum Note (Signed)
Addended byDamita Dunnings D on: 12/03/2016 04:53 PM   Modules accepted: Orders

## 2016-12-14 ENCOUNTER — Other Ambulatory Visit: Payer: Self-pay | Admitting: Internal Medicine

## 2017-01-04 ENCOUNTER — Other Ambulatory Visit: Payer: Self-pay | Admitting: Internal Medicine

## 2017-01-29 ENCOUNTER — Telehealth: Payer: Self-pay | Admitting: *Deleted

## 2017-01-29 NOTE — Telephone Encounter (Signed)
declined

## 2017-04-18 ENCOUNTER — Telehealth: Payer: Self-pay | Admitting: Internal Medicine

## 2017-04-18 NOTE — Telephone Encounter (Signed)
Left pt message asking to call Allison back directly at [phone] to schedule AWV. Thanks! °

## 2017-05-14 ENCOUNTER — Other Ambulatory Visit: Payer: Self-pay | Admitting: Internal Medicine

## 2017-05-27 ENCOUNTER — Ambulatory Visit (INDEPENDENT_AMBULATORY_CARE_PROVIDER_SITE_OTHER): Payer: Medicare Other | Admitting: Internal Medicine

## 2017-05-27 ENCOUNTER — Encounter: Payer: Self-pay | Admitting: Internal Medicine

## 2017-05-27 VITALS — BP 132/68 | HR 63 | Temp 98.0°F | Resp 14 | Ht 68.0 in | Wt 200.2 lb

## 2017-05-27 DIAGNOSIS — E785 Hyperlipidemia, unspecified: Secondary | ICD-10-CM

## 2017-05-27 DIAGNOSIS — N411 Chronic prostatitis: Secondary | ICD-10-CM | POA: Diagnosis not present

## 2017-05-27 DIAGNOSIS — Z1211 Encounter for screening for malignant neoplasm of colon: Secondary | ICD-10-CM

## 2017-05-27 DIAGNOSIS — I10 Essential (primary) hypertension: Secondary | ICD-10-CM

## 2017-05-27 DIAGNOSIS — Z0000 Encounter for general adult medical examination without abnormal findings: Secondary | ICD-10-CM

## 2017-05-27 LAB — COMPREHENSIVE METABOLIC PANEL
ALBUMIN: 3.9 g/dL (ref 3.5–5.2)
ALT: 18 U/L (ref 0–53)
AST: 14 U/L (ref 0–37)
Alkaline Phosphatase: 60 U/L (ref 39–117)
BUN: 15 mg/dL (ref 6–23)
CALCIUM: 9.1 mg/dL (ref 8.4–10.5)
CO2: 28 mEq/L (ref 19–32)
CREATININE: 1.1 mg/dL (ref 0.40–1.50)
Chloride: 108 mEq/L (ref 96–112)
GFR: 83.21 mL/min (ref >60.00)
Glucose, Bld: 96 mg/dL (ref 70–99)
Potassium: 4.4 mEq/L (ref 3.5–5.1)
Sodium: 141 mEq/L (ref 135–145)
Total Bilirubin: 0.7 mg/dL (ref 0.2–1.2)
Total Protein: 6.3 g/dL (ref 6.0–8.3)

## 2017-05-27 LAB — CBC WITH DIFFERENTIAL/PLATELET
BASOS PCT: 0.5 % (ref 0.0–3.0)
Basophils Absolute: 0 10*3/uL (ref 0.0–0.1)
EOS PCT: 5.4 % — AB (ref 0.0–5.0)
Eosinophils Absolute: 0.3 10*3/uL (ref 0.0–0.7)
HCT: 44.4 % (ref 39.0–52.0)
HEMOGLOBIN: 14.2 g/dL (ref 13.0–17.0)
Lymphocytes Relative: 20.6 % (ref 12.0–46.0)
Lymphs Abs: 1 10*3/uL (ref 0.7–4.0)
MCHC: 32.1 g/dL (ref 30.0–36.0)
MCV: 85.4 fl (ref 78.0–100.0)
MONOS PCT: 12.3 % — AB (ref 3.0–12.0)
Monocytes Absolute: 0.6 10*3/uL (ref 0.1–1.0)
Neutro Abs: 2.9 10*3/uL (ref 1.4–7.7)
Neutrophils Relative %: 61.2 % (ref 43.0–77.0)
Platelets: 108 10*3/uL — ABNORMAL LOW (ref 150.0–400.0)
RBC: 5.2 Mil/uL (ref 4.22–5.81)
RDW: 13.1 % (ref 11.5–15.5)
WBC: 4.8 10*3/uL (ref 4.0–10.5)

## 2017-05-27 LAB — TSH: TSH: 2.28 u[IU]/mL (ref 0.35–4.50)

## 2017-05-27 LAB — LIPID PANEL
CHOLESTEROL: 164 mg/dL (ref 0–200)
HDL: 44.2 mg/dL (ref >39.00)
LDL Cholesterol: 108 mg/dL — ABNORMAL HIGH (ref 0–99)
NonHDL: 119.45
TRIGLYCERIDES: 55 mg/dL (ref 0.0–149.0)
Total CHOL/HDL Ratio: 4
VLDL: 11 mg/dL (ref 0.0–40.0)

## 2017-05-27 LAB — PSA: PSA: 1.31 ng/mL (ref 0.10–4.00)

## 2017-05-27 MED ORDER — DILTIAZEM HCL ER COATED BEADS 240 MG PO CP24: 240.0000 mg | ORAL_CAPSULE | Freq: Two times a day (BID) | ORAL | 1 refills | Status: DC

## 2017-05-27 MED ORDER — TAMSULOSIN HCL 0.4 MG PO CAPS: 0.4000 mg | ORAL_CAPSULE | Freq: Two times a day (BID) | ORAL | 1 refills | Status: DC

## 2017-05-27 MED ORDER — LOSARTAN POTASSIUM 50 MG PO TABS: 50.0000 mg | ORAL_TABLET | Freq: Every day | ORAL | 1 refills | Status: DC

## 2017-05-27 NOTE — Progress Notes (Signed)
Subjective:    Patient ID: Darren Barnes, male    DOB: [birth year], 76 y.o.   MRN: [id number]  DOS:  05/27/2017 Type of visit - description : rov Interval history: In general feeling well. At the last visit was seen with a persistent UTI, saw urology. Notes reviewed HTN: Good compliance of medication, amb  BPs in the 130s. Hyperlipidemia: Healthy lifestyle. Trying to eat well and stay active although exercise is limited by knee pain.   Review of Systems No chest pain or difficulty breathing No nausea, vomiting, diarrhea No claudication No anxiety or depression At this point he has no dysuria, hematuria or difficulty urinating  Past Medical History:  Diagnosis Date  . ED (erectile dysfunction)   . Glaucoma   . Hyperlipidemia   . Hypertension   . Nocturia    saw urology 2012, was eval, rx vesicare (after several other meds failed)  . OSA on CPAP   . Osteoarthritis   . Thrombocytopenia ([place])    saw hematology, observe    Past Surgical History:  Procedure Laterality Date  . anal polyp resection  2008   Dr Debbora Presto  . Benign tumor from Left arm    . KNEE ARTHROSCOPY  2004  . Sinus surgery for benign tumor      Social History   Social History  . Marital status: Married    Spouse name: N/A  . Number of children: 3  . Years of education: N/A   Occupational History  . Retired [employer] [place]     retired   Social History Main Topics  . Smoking status: Never Smoker  . Smokeless tobacco: Never Used  . Alcohol use Yes     Comment: 2 per day on average  . Drug use: No  . Sexual activity: Not on file   Other Topics Concern  . Not on file   Social History Narrative   Lives w/ wife   Lost one son, 2 living children, 8 g-kids                  Allergies as of 05/27/2017   No Known Allergies     Medication List       Accurate as of 05/27/17  7:25 PM. Always use your most recent med list.          aspirin 81 MG tablet Take 81 mg by mouth daily.   COMBIGAN  0.2-0.5 % ophthalmic solution Generic drug:  brimonidine-timolol Place 1 drop into both eyes 2 (two) times daily.   diltiazem 240 MG 24 hr capsule Commonly known as:  CARDIZEM CD Take 1 capsule (240 mg total) by mouth 2 (two) times daily.   losartan 50 MG tablet Commonly known as:  COZAAR Take 1 tablet (50 mg total) by mouth daily.   LUMIGAN 0.01 % Soln Generic drug:  bimatoprost Place 1 drop into both eyes every evening.   multivitamin per tablet Take 1 tablet by mouth daily.   tamsulosin 0.4 MG Caps capsule Commonly known as:  FLOMAX Take 1 capsule (0.4 mg total) by mouth 2 (two) times daily.          Objective:   Physical Exam BP 132/68 (BP Location: Left Arm, Patient Position: Sitting, Cuff Size: Normal)   Pulse 63   Temp 98 F (36.7 C) (Oral)   Resp 14   Ht 5\' 8"  (1.727 m)   Wt 200 lb 4 oz (90.8 kg)   SpO2 96%   BMI 30.45  kg/m   General:   Well developed, well nourished . NAD.  Neck: No  thyromegaly  HEENT:  Normocephalic . Face symmetric, atraumatic Lungs:  CTA B Normal respiratory effort, no intercostal retractions, no accessory muscle use. Heart: RRR,  no murmur.  No pretibial edema bilaterally  Abdomen:  Not distended, soft, non-tender. No rebound or rigidity.   Skin: Exposed areas without rash. Not pale. Not jaundice Neurologic:  alert & oriented X3.  Speech normal, gait appropriate for age and unassisted Strength symmetric and appropriate for age.  Psych: Cognition and judgment appear intact.  Cooperative with normal attention span and concentration.  Behavior appropriate. No anxious or depressed appearing.    Assessment & Plan:   Assessment v HTN- on Cardizem for a while, added losartan 10-2015 Hyperlipidemia ED DJD- s/p knee injections (flexogenic) ~ 2016; I signed a parking permit OSA on CPAP Glaucoma Abdominal wall mass see physical exam 05-25-2016 H/o Thrombocytopenia, saw hematology, rx  observation GU: --H/o LUTS- Nocturia  Saw   Urologist ~2012 underwent full workup >> dx BPH and detrusor instability, failed several medications --Re-eval by urology:12-2016 Recurrence cystitis, chronic prostatitis  PLAN: Primary care issues reviewed, recommend to schedule Medicare wellness HTN: Continue Cardiazem and losartan, checking a CMP, CBC and TSH Hyperlipidemia: Diet control, check a FLP L UTS, chronic prostatitis, recurrent cystitis: Saw urology 12-2016, was prescribed antibiotics for a month which he did . Also Flomax twice a day. He is feeling better, patient request a PSA. Will do.  Was rec  to RTC to urology prn RTC 6 months

## 2017-05-27 NOTE — Assessment & Plan Note (Signed)
Primary care issues reviewed, recommend to schedule Medicare wellness HTN: Continue Cardiazem and losartan, checking a CMP, CBC and TSH Hyperlipidemia: Diet control, check a FLP L UTS, chronic prostatitis, recurrent cystitis: Saw urology 12-2016, was prescribed antibiotics for a month which he did . Also Flomax twice a day. He is feeling better, patient request a PSA. Will do.  Was rec  to RTC to urology prn RTC 6 months

## 2017-05-27 NOTE — Progress Notes (Signed)
Pre visit review using our clinic review tool, if applicable. No additional management support is needed unless otherwise documented below in the visit note. 

## 2017-05-27 NOTE — Assessment & Plan Note (Addendum)
--  Td 2015; pneumonia shot 2005 and  2010;  prevnar 2015; shingles vaccine 2010; shingrex on back order, pt aware --CCS: Cscope 6-08  (Dr Carlean Purl) neg but had a polypoid anal lesion, lesion removed by Dr Bubba Camp: due for a acsope and likes to proceed , GI referral  --prostate ca screening : last visit w/ urology 12-2016, DRE wnl ; pt request a psa --Height decreased -->   DEXA 05-2014 normal  --Diet exercise discussed

## 2017-05-27 NOTE — Patient Instructions (Signed)
GO TO THE LAB : Get the blood work     GO TO THE FRONT DESK Schedule your next appointment for a  routine checkup in 6 months  Consider see one of our nurses for Medicare wellness visit.

## 2017-05-29 NOTE — Telephone Encounter (Signed)
Pt declined awv  °

## 2017-06-17 ENCOUNTER — Other Ambulatory Visit: Payer: Self-pay | Admitting: Internal Medicine

## 2017-06-29 ENCOUNTER — Other Ambulatory Visit: Payer: Self-pay | Admitting: Internal Medicine

## 2017-07-11 ENCOUNTER — Ambulatory Visit (AMBULATORY_SURGERY_CENTER): Payer: Self-pay

## 2017-07-11 ENCOUNTER — Encounter: Payer: Self-pay | Admitting: Internal Medicine

## 2017-07-11 VITALS — Ht 69.0 in | Wt 204.6 lb

## 2017-07-11 DIAGNOSIS — Z1211 Encounter for screening for malignant neoplasm of colon: Secondary | ICD-10-CM

## 2017-07-11 NOTE — Progress Notes (Signed)
Denies allergies to eggs or soy products. Denies complication of anesthesia or sedation. Denies use of weight loss medication. Denies use of O2.   Emmi instructions given for colonoscopy.  

## 2017-07-25 ENCOUNTER — Encounter: Payer: Self-pay | Admitting: Internal Medicine

## 2017-07-25 ENCOUNTER — Ambulatory Visit (AMBULATORY_SURGERY_CENTER): Payer: Medicare Other | Admitting: Internal Medicine

## 2017-07-25 VITALS — BP 144/65 | HR 62 | Temp 99.0°F | Resp 20 | Ht 69.0 in | Wt 204.0 lb

## 2017-07-25 DIAGNOSIS — D123 Benign neoplasm of transverse colon: Secondary | ICD-10-CM

## 2017-07-25 DIAGNOSIS — Z1211 Encounter for screening for malignant neoplasm of colon: Secondary | ICD-10-CM | POA: Diagnosis not present

## 2017-07-25 DIAGNOSIS — Z1212 Encounter for screening for malignant neoplasm of rectum: Secondary | ICD-10-CM

## 2017-07-25 MED ORDER — SODIUM CHLORIDE 0.9 % IV SOLN: 500.0000 mL | INTRAVENOUS | Status: DC

## 2017-07-25 NOTE — Progress Notes (Signed)
Report to PACU, RN, vss, BBS= Clear.  

## 2017-07-25 NOTE — Patient Instructions (Addendum)
I found and removed 4 polyps - I doubt I will recommend repeating a routine colonoscopy.  I will let you know pathology results and whether or not to have another routine colonoscopy by mail and/or My Chart.  You also have a condition called diverticulosis - common and not usually a problem. Please read the handout provided.  I appreciate the opportunity to care for you. Gatha Mayer, MD, [facility]   Discharge instructions given. Handouts on polyps and diverticulosis. Resume previous medications. YOU HAD AN ENDOSCOPIC PROCEDURE TODAY AT [facility]:   Refer to the procedure report that was given to you for any specific questions about what was found during the examination.  If the procedure report does not answer your questions, please call your gastroenterologist to clarify.  If you requested that your care partner not be given the details of your procedure findings, then the procedure report has been included in a sealed envelope for you to review at your convenience later.  YOU SHOULD EXPECT: Some feelings of bloating in the abdomen. Passage of more gas than usual.  Walking can help get rid of the air that was put into your GI tract during the procedure and reduce the bloating. If you had a lower endoscopy (such as a colonoscopy or flexible sigmoidoscopy) you may notice spotting of blood in your stool or on the toilet paper. If you underwent a bowel prep for your procedure, you may not have a normal bowel movement for a few days.  Please Note:  You might notice some irritation and congestion in your nose or some drainage.  This is from the oxygen used during your procedure.  There is no need for concern and it should clear up in a day or so.  SYMPTOMS TO REPORT IMMEDIATELY:   Following lower endoscopy (colonoscopy or flexible sigmoidoscopy):  Excessive amounts of blood in the stool  Significant tenderness or worsening of abdominal pains  Swelling of the abdomen  that is new, acute  Fever of 100F or higher   For urgent or emergent issues, a gastroenterologist can be reached at any hour by calling [phone].   DIET:  We do recommend a small meal at first, but then you may proceed to your regular diet.  Drink plenty of fluids but you should avoid alcoholic beverages for 24 hours.  ACTIVITY:  You should plan to take it easy for the rest of today and you should NOT DRIVE or use heavy machinery until tomorrow (because of the sedation medicines used during the test).    FOLLOW UP: Our staff will call the number listed on your records the next business day following your procedure to check on you and address any questions or concerns that you may have regarding the information given to you following your procedure. If we do not reach you, we will leave a message.  However, if you are feeling well and you are not experiencing any problems, there is no need to return our call.  We will assume that you have returned to your regular daily activities without incident.  If any biopsies were taken you will be contacted by phone or by letter within the next 1-3 weeks.  Please call us at [phone] if you have not heard about the biopsies in 3 weeks.    SIGNATURES/CONFIDENTIALITY: You and/or your care partner have signed paperwork which will be entered into your electronic medical record.  These signatures attest to the fact that that  the information above on your After Visit Summary has been reviewed and is understood.  Full responsibility of the confidentiality of this discharge information lies with you and/or your care-partner.

## 2017-07-25 NOTE — Progress Notes (Signed)
Called to room to assist during endoscopic procedure.  Patient ID and intended procedure confirmed with present staff. Received instructions for my participation in the procedure from the performing physician.  

## 2017-07-25 NOTE — Op Note (Signed)
[facility] Patient Name: Darren Barnes Procedure Date: 07/25/2017 10:40 AM MRN: [id number] Endoscopist: Gatha Mayer , MD Age: 76 Referring MD:  Date of Birth: 1940-12-31 Gender: Male Account #: [id number] Procedure:                Colonoscopy Indications:              Screening for colorectal malignant neoplasm Medicines:                Propofol per Anesthesia, Monitored Anesthesia Care Procedure:                Pre-Anesthesia Assessment:                           - Prior to the procedure, a History and Physical                            was performed, and patient medications and                            allergies were reviewed. The patient's tolerance of                            previous anesthesia was also reviewed. The risks                            and benefits of the procedure and the sedation                            options and risks were discussed with the patient.                            All questions were answered, and informed consent                            was obtained. Prior Anticoagulants: The patient has                            taken no previous anticoagulant or antiplatelet                            agents. ASA Grade Assessment: II - A patient with                            mild systemic disease. After reviewing the risks                            and benefits, the patient was deemed in                            satisfactory condition to undergo the procedure.                           After obtaining informed consent, the colonoscope  was passed under direct vision. Throughout the                            procedure, the patient's blood pressure, pulse, and                            oxygen saturations were monitored continuously. The                            Colonoscope was introduced through the anus and                            advanced to the the cecum, identified by   appendiceal orifice and ileocecal valve. The                            colonoscopy was performed without difficulty. The                            patient tolerated the procedure well. The quality                            of the bowel preparation was good. The ileocecal                            valve, appendiceal orifice, and rectum were                            photographed. The bowel preparation used was                            Miralax. Scope In: 10:45:39 AM Scope Out: 11:00:52 AM Scope Withdrawal Time: 0 hours 13 minutes 24 seconds  Total Procedure Duration: 0 hours 15 minutes 13 seconds  Findings:                 The perianal exam findings include hypertrophied                            anal papilla(e).                           The digital rectal exam was normal. Pertinent                            negatives include normal prostate (size, shape, and                            consistency).                           Two sessile polyps were found in the transverse                            colon. The polyps were 5 to 8 mm in size. These  polyps were removed with a cold snare. Resection                            and retrieval were complete. Verification of                            patient identification for the specimen was done.                            Estimated blood loss was minimal.                           Two sessile polyps were found in the transverse                            colon. The polyps were 2 mm in size. These polyps                            were removed with a cold biopsy forceps. Resection                            and retrieval were complete. Verification of                            patient identification for the specimen was done.                            Estimated blood loss was minimal.                           Many small and large-mouthed diverticula were found                            in the sigmoid  colon.                           Anal papilla(e) were hypertrophied.                           The exam was otherwise without abnormality on                            direct and retroflexion views. Complications:            No immediate complications. Estimated Blood Loss:     Estimated blood loss was minimal. Impression:               - Hypertrophied anal papilla(e) found on perianal                            exam.                           - Two 5 to 8 mm polyps in the transverse colon,  removed with a cold snare. Resected and retrieved.                           - Two 2 mm polyps in the transverse colon, removed                            with a cold biopsy forceps. Resected and retrieved.                           - Diverticulosis in the sigmoid colon.                           - Anal papilla(e) were hypertrophied.                           - The examination was otherwise normal on direct                            and retroflexion views. Recommendation:           - Patient has a contact number available for                            emergencies. The signs and symptoms of potential                            delayed complications were discussed with the                            patient. Return to normal activities tomorrow.                            Written discharge instructions were provided to the                            patient.                           - Resume previous diet.                           - Continue present medications.                           - No repeat colonoscopy likely due to age - will                            review pathology and decide. Gatha Mayer, MD 07/25/2017 11:07:17 AM This report has been signed electronically.

## 2017-07-26 ENCOUNTER — Telehealth: Payer: Self-pay

## 2017-07-26 NOTE — Telephone Encounter (Signed)
Called #[phone] and left a messaged we tried to reach pt for a follow up call. maw °

## 2017-07-26 NOTE — Telephone Encounter (Signed)
Left message on answering machine. 

## 2017-07-30 ENCOUNTER — Encounter: Payer: Self-pay | Admitting: Internal Medicine

## 2017-07-30 DIAGNOSIS — Z86010 Personal history of colonic polyps: Secondary | ICD-10-CM

## 2017-07-30 DIAGNOSIS — Z860101 Personal history of adenomatous and serrated colon polyps: Secondary | ICD-10-CM

## 2017-07-30 HISTORY — DX: Personal history of colonic polyps: Z86.010

## 2017-07-30 HISTORY — DX: Personal history of adenomatous and serrated colon polyps: Z86.0101

## 2017-07-30 NOTE — Progress Notes (Signed)
4 adenomas all < 1 cm No recall - age 76 now My Chart letter

## 2017-08-09 ENCOUNTER — Telehealth: Payer: Self-pay | Admitting: Internal Medicine

## 2017-08-09 NOTE — Telephone Encounter (Signed)
Please advise 

## 2017-08-09 NOTE — Telephone Encounter (Signed)
Spoke w/ Pt, informed of recommendations. PCP first available not until 09/05/2017- offered appt w/ another provider- Pt declined. He will call [facility] to see if he can be seen by [facility].

## 2017-08-09 NOTE — Telephone Encounter (Signed)
Pt called in because he would like to know if he could come in to complete UA? Pt says that since having his colonoscopy his urine has had a odor to it.   CB: [phone]

## 2017-08-09 NOTE — Telephone Encounter (Signed)
Advise patient to come to the office to discuss symptoms. He has L UTS and was seen by urology few months ago.

## 2017-08-10 ENCOUNTER — Other Ambulatory Visit: Payer: Self-pay | Admitting: Internal Medicine

## 2017-11-25 ENCOUNTER — Encounter: Payer: Self-pay | Admitting: Internal Medicine

## 2017-11-25 ENCOUNTER — Ambulatory Visit: Payer: Medicare Other | Admitting: Internal Medicine

## 2017-11-25 VITALS — BP 116/70 | HR 52 | Temp 97.8°F | Resp 14 | Ht 69.0 in | Wt 202.4 lb

## 2017-11-25 DIAGNOSIS — G4733 Obstructive sleep apnea (adult) (pediatric): Secondary | ICD-10-CM

## 2017-11-25 DIAGNOSIS — I10 Essential (primary) hypertension: Secondary | ICD-10-CM | POA: Diagnosis not present

## 2017-11-25 DIAGNOSIS — R399 Unspecified symptoms and signs involving the genitourinary system: Secondary | ICD-10-CM | POA: Diagnosis not present

## 2017-11-25 NOTE — Patient Instructions (Signed)
GO TO THE LAB : Get the blood work     GO TO THE FRONT DESK Schedule your next appointment for a  Physical exam in 6 to 8 months     Check the  blood pressure 2 or 3 times a month  Be sure your blood pressure is between 110/65 and  135/85. If it is consistently higher or lower, let me know

## 2017-11-25 NOTE — Progress Notes (Signed)
Pre visit review using our clinic review tool, if applicable. No additional management support is needed unless otherwise documented below in the visit note. 

## 2017-11-25 NOTE — Assessment & Plan Note (Signed)
HTN: Seems well controlled on Cardizem and losartan, check a BMP L UTS, chronic prostatitis, recurrent cystitis: Sxs at baseline, nocturia x2-3 per night.  Sxs not bothersome to him. OSA: Has not been using the CPAP in a while, mask bothers him.  Risk of untreated OSA discussed, he is reluctant to try again because he feels well. Encouraged to stay physically active. RTC 6-8 months, CPX

## 2017-11-25 NOTE — Progress Notes (Signed)
Subjective:    Patient ID: Darren Barnes, male    DOB: [birth year], 76 y.o.   MRN: [id number]  DOS:  11/25/2017 Type of visit - description : rov Interval history: HTN: Good compliance with medication, ambulatory BPs in the 130s/68 OSA: Not using a CPAP for a while. Nocturia: At baseline, goes to the bathroom 2-3 times at night, symptoms are not actually bothersome to him. Good compliance with  meds.   Review of Systems  Energy level is good, does not needs a nap throughout the day. Still snores to some extent.  Past Medical History:  Diagnosis Date  . ED (erectile dysfunction)   . Glaucoma   . Hx of adenomatous colonic polyps 07/30/2017  . Hyperlipidemia   . Hypertension   . Nocturia    saw urology 2012, was eval, rx vesicare (after several other meds failed)  . OSA on CPAP   . Osteoarthritis   . Sleep apnea   . Thrombocytopenia ([place])    saw hematology, observe    Past Surgical History:  Procedure Laterality Date  . anal polyp resection  2008   Dr Debbora Presto  . Benign tumor from Left arm    . KNEE ARTHROSCOPY  2004  . Sinus surgery for benign tumor      Social History   Socioeconomic History  . Marital status: Married    Spouse name: Not on file  . Number of children: 3  . Years of education: Not on file  . Highest education level: Not on file  Social Needs  . Financial resource strain: Not on file  . Food insecurity - worry: Not on file  . Food insecurity - inability: Not on file  . Transportation needs - medical: Not on file  . Transportation needs - non-medical: Not on file  Occupational History  . Occupation: Retired [employer] [place]: retired  Tobacco Use  . Smoking status: Never Smoker  . Smokeless tobacco: Never Used  Substance and Sexual Activity  . Alcohol use: Yes    Comment: 2 per day on average  . Drug use: No  . Sexual activity: Not on file  Other Topics Concern  . Not on file  Social History Narrative   Lives w/ wife   Lost one son, 2  living children, 8 g-kids               Allergies as of 11/25/2017   No Known Allergies     Medication List        Accurate as of 11/25/17  6:39 PM. Always use your most recent med list.          aspirin 81 MG tablet Take 81 mg by mouth daily.   COMBIGAN 0.2-0.5 % ophthalmic solution Generic drug:  brimonidine-timolol Place 1 drop into both eyes 2 (two) times daily.   diltiazem 240 MG 24 hr capsule Commonly known as:  CARDIZEM CD Take 1 capsule (240 mg total) by mouth 2 (two) times daily.   dorzolamide-timolol 22.3-6.8 MG/ML ophthalmic solution Commonly known as:  COSOPT Place 1 drop into both eyes 2 (two) times daily.   losartan 50 MG tablet Commonly known as:  COZAAR Take 1 tablet (50 mg total) by mouth daily.   LUMIGAN 0.01 % Soln Generic drug:  bimatoprost Place 1 drop into both eyes every evening.   multivitamin per tablet Take 1 tablet by mouth daily.   tamsulosin 0.4 MG Caps capsule Commonly known as:  FLOMAX Take  1 capsule (0.4 mg total) by mouth 2 (two) times daily.          Objective:   Physical Exam BP 116/70 (BP Location: Left Arm, Patient Position: Sitting, Cuff Size: Small)   Pulse (!) 52   Temp 97.8 F (36.6 C) (Oral)   Resp 14   Ht 5\' 9"  (1.753 m)   Wt 202 lb 6 oz (91.8 kg)   SpO2 98%   BMI 29.89 kg/m  General:   Well developed, well nourished . NAD.  HEENT:  Normocephalic . Face symmetric, atraumatic Lungs:  CTA B Normal respiratory effort, no intercostal retractions, no accessory muscle use. Heart: RRR,  no murmur.  No pretibial edema bilaterally  Skin: Not pale. Not jaundice Neurologic:  alert & oriented X3.  Speech normal, gait appropriate for age and unassisted Psych--  Cognition and judgment appear intact.  Cooperative with normal attention span and concentration.  Behavior appropriate. No anxious or depressed appearing.      Assessment & Plan:   Assessment   HTN- on Cardizem for a while, added losartan  10-2015 Hyperlipidemia ED DJD- s/p knee injections (flexogenic) ~ 2016; I signed a parking permit OSA : not using  CPAP as off 10-2017 Glaucoma Abdominal wall mass see physical exam 05-25-2016 H/o Thrombocytopenia, saw hematology, rx  observation GU: --H/o LUTS- Nocturia  Saw  Urologist ~2012 underwent full workup >> dx BPH and detrusor instability, failed several medications --Re-eval by urology:12-2016 Recurrence cystitis, chronic prostatitis  PLAN: HTN: Seems well controlled on Cardizem and losartan, check a BMP L UTS, chronic prostatitis, recurrent cystitis: Sxs at baseline, nocturia x2-3 per night.  Sxs not bothersome to him. OSA: Has not been using the CPAP in a while, mask bothers him.  Risk of untreated OSA discussed, he is reluctant to try again because he feels well. Encouraged to stay physically active. RTC 6-8 months, CPX

## 2017-11-26 LAB — BASIC METABOLIC PANEL
BUN: 16 mg/dL (ref 6–23)
CHLORIDE: 107 meq/L (ref 96–112)
CO2: 26 meq/L (ref 19–32)
CREATININE: 1.12 mg/dL (ref 0.40–1.50)
Calcium: 8.9 mg/dL (ref 8.4–10.5)
GFR: 81.39 mL/min (ref >60.00)
Glucose, Bld: 88 mg/dL (ref 70–99)
Potassium: 4.4 mEq/L (ref 3.5–5.1)
Sodium: 140 mEq/L (ref 135–145)

## 2017-12-08 ENCOUNTER — Other Ambulatory Visit: Payer: Self-pay | Admitting: Internal Medicine

## 2018-01-31 ENCOUNTER — Other Ambulatory Visit: Payer: Self-pay | Admitting: Internal Medicine

## 2018-02-11 ENCOUNTER — Other Ambulatory Visit: Payer: Self-pay | Admitting: Internal Medicine

## 2018-04-29 ENCOUNTER — Other Ambulatory Visit: Payer: Self-pay | Admitting: Internal Medicine

## 2018-05-22 ENCOUNTER — Other Ambulatory Visit: Payer: Self-pay | Admitting: Internal Medicine

## 2018-05-29 ENCOUNTER — Encounter: Payer: Self-pay | Admitting: Internal Medicine

## 2018-05-29 ENCOUNTER — Ambulatory Visit (INDEPENDENT_AMBULATORY_CARE_PROVIDER_SITE_OTHER): Payer: Medicare Other | Admitting: Internal Medicine

## 2018-05-29 VITALS — BP 136/61 | HR 66 | Temp 98.0°F | Resp 16 | Ht 69.0 in | Wt 205.0 lb

## 2018-05-29 DIAGNOSIS — Z0000 Encounter for general adult medical examination without abnormal findings: Secondary | ICD-10-CM

## 2018-05-29 LAB — COMPREHENSIVE METABOLIC PANEL
ALT: 19 U/L (ref 0–53)
AST: 14 U/L (ref 0–37)
Albumin: 3.9 g/dL (ref 3.5–5.2)
Alkaline Phosphatase: 60 U/L (ref 39–117)
BUN: 14 mg/dL (ref 6–23)
CHLORIDE: 109 meq/L (ref 96–112)
CO2: 24 meq/L (ref 19–32)
CREATININE: 1.13 mg/dL (ref 0.40–1.50)
Calcium: 9 mg/dL (ref 8.4–10.5)
GFR: 80.45 mL/min (ref >60.00)
GLUCOSE: 110 mg/dL — AB (ref 70–99)
POTASSIUM: 4 meq/L (ref 3.5–5.1)
SODIUM: 140 meq/L (ref 135–145)
Total Bilirubin: 0.8 mg/dL (ref 0.2–1.2)
Total Protein: 6.3 g/dL (ref 6.0–8.3)

## 2018-05-29 LAB — LIPID PANEL
CHOL/HDL RATIO: 4
CHOLESTEROL: 175 mg/dL (ref 0–200)
HDL: 45.4 mg/dL (ref >39.00)
LDL Cholesterol: 117 mg/dL — ABNORMAL HIGH (ref 0–99)
NONHDL: 130.09
Triglycerides: 66 mg/dL (ref 0.0–149.0)
VLDL: 13.2 mg/dL (ref 0.0–40.0)

## 2018-05-29 LAB — TSH: TSH: 2.01 u[IU]/mL (ref 0.35–4.50)

## 2018-05-29 NOTE — Patient Instructions (Signed)
GO TO THE LAB : Get the blood work     GO TO THE FRONT DESK Schedule your next appointment for a checkup in 8- 9 months   Check the  blood pressure 2 or 3 times a month  Be sure your blood pressure is between 110/65 and  135/85. If it is consistently higher or lower, let me know

## 2018-05-29 NOTE — Assessment & Plan Note (Signed)
HTN: Seems controlled on Cardizem, losartan.  Checking labs Hyperlipidemia: Diet controlled, checking labs ED, L UTS: Symptoms at baseline, on Flomax.  Denies any need to change anything at this point. RTC 8 to 9 months

## 2018-05-29 NOTE — Progress Notes (Signed)
Subjective:    Patient ID: Darren Barnes, male    DOB: 1941-05-13, 77 y.o.   MRN: [id number]  DOS:  05/29/2018 Type of visit - description : cpx Interval history: No major concerns. Good compliance with medications Self discontinue aspirin   Review of Systems LUTS symptoms at baseline, still having issues with ED .  Other than above, a 14 point review of systems is negative     Past Medical History:  Diagnosis Date  . ED (erectile dysfunction)   . Glaucoma   . Hx of adenomatous colonic polyps 07/30/2017  . Hyperlipidemia   . Hypertension   . Nocturia    saw urology 2012, was eval, rx vesicare (after several other meds failed)  . OSA on CPAP   . Osteoarthritis   . Sleep apnea   . Thrombocytopenia ([place])    saw hematology, observe    Past Surgical History:  Procedure Laterality Date  . anal polyp resection  2008   Dr Debbora Presto  . Benign tumor from Left arm    . KNEE ARTHROSCOPY  2004  . Sinus surgery for benign tumor      Social History   Socioeconomic History  . Marital status: Married    Spouse name: Not on file  . Number of children: 3  . Years of education: Not on file  . Highest education level: Not on file  Occupational History  . 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 Financial resource strain: Not on file  . Food insecurity:    Worry: Not on file    Inability: Not on file  . Transportation needs:    Medical: Not on file    Non-medical: Not on file  Tobacco Use  . Smoking status: Never Smoker  . Smokeless tobacco: Never Used  Substance and Sexual Activity  . Alcohol use: Yes    Comment: 2 per day on average  . Drug use: No  . Sexual activity: Not on file  Lifestyle  . Physical activity:    Days per week: Not on file    Minutes per session: Not on file  . Stress: Not on file  Relationships  . Social connections:    Talks on phone: Not on file    Gets together: Not on file    Attends religious service: Not on file      Active member of club or organization: Not on file    Attends meetings of clubs or organizations: Not on file    Relationship status: Not on file  . Intimate partner violence:    Fear of current or ex partner: Not on file    Emotionally abused: Not on file    Physically abused: Not on file    Forced sexual activity: Not on file  Other Topics Concern  . Not on file  Social History Narrative   Lives w/ wife   Lost one son, 2 living children, 8 g-kids              Family History  Problem Relation Age of Onset  . Coronary artery disease Father 52       MI at 37  . Hypertension Mother        Jerilynn Mages and F   . Diabetes Mother   . Stroke Mother 24  . Colon cancer Neg Hx   . Prostate cancer Neg Hx   . Esophageal cancer Neg Hx   . Pancreatic cancer  Neg Hx   . Rectal cancer Neg Hx   . Stomach cancer Neg Hx     Allergies as of 05/29/2018   No Known Allergies     Medication List        Accurate as of 05/29/18  6:43 PM. Always use your most recent med list.          diltiazem 240 MG 24 hr capsule Commonly known as:  CARDIZEM CD Take 1 capsule (240 mg total) by mouth 2 (two) times daily.   dorzolamide-timolol 22.3-6.8 MG/ML ophthalmic solution Commonly known as:  COSOPT Place 1 drop into both eyes 2 (two) times daily.   losartan 50 MG tablet Commonly known as:  COZAAR Take 1 tablet (50 mg total) by mouth daily.   LUMIGAN 0.01 % Soln Generic drug:  bimatoprost Place 1 drop into both eyes every evening.   multivitamin per tablet Take 1 tablet by mouth daily.   tamsulosin 0.4 MG Caps capsule Commonly known as:  FLOMAX Take 1 capsule (0.4 mg total) by mouth 2 (two) times daily.          Objective:   Physical Exam BP 136/61 (BP Location: Left Arm, Patient Position: Sitting, Cuff Size: Small)   Pulse 66   Temp 98 F (36.7 C) (Oral)   Resp 16   Ht 5\' 9"  (1.753 m)   Wt 205 lb (93 kg)   SpO2 99%   BMI 30.27 kg/m  General:   Well developed, well nourished .  NAD.  Neck: No  thyromegaly  HEENT:  Normocephalic . Face symmetric, atraumatic Lungs:  CTA B Normal respiratory effort, no intercostal retractions, no accessory muscle use. Heart: RRR,  no murmur.  No pretibial edema bilaterally  Abdomen:  Not distended, soft, non-tender. No rebound or rigidity.   Skin: Exposed areas without rash. Not pale. Not jaundice Neurologic:  alert & oriented X3.  Speech normal, gait appropriate for age and unassisted Strength symmetric and appropriate for age.  Psych: Cognition and judgment appear intact.  Cooperative with normal attention span and concentration.  Behavior appropriate. No anxious or depressed appearing.     Assessment & Plan:   Assessment   HTN- on Cardizem for a while, added losartan 10-2015 Hyperlipidemia ED DJD- s/p knee injections (flexogenic) ~ 2016; I signed a parking permit OSA : not using  CPAP as off 10-2017 Glaucoma Abdominal wall mass see physical exam 05-25-2016 H/o Thrombocytopenia, saw hematology, rx  observation GU: --H/o LUTS- Nocturia  Saw  Urologist ~2012 underwent full workup >> dx BPH and detrusor instability, failed several medications --Re-eval by urology:12-2016 Recurrence cystitis, chronic prostatitis  PLAN: HTN: Seems controlled on Cardizem, losartan.  Checking labs Hyperlipidemia: Diet controlled, checking labs ED, L UTS: Symptoms at baseline, on Flomax.  Denies any need to change anything at this point. RTC 8 to 9 months

## 2018-05-29 NOTE — Assessment & Plan Note (Addendum)
--  Td 2015; PNM 23: 2005 and  2010;  prevnar 2015; shingles vaccine 2010; shingrex not available --CCS: Cscope 6-08  (Dr Carlean Purl) neg but had a polypoid anal lesion, lesion removed by Dr Bubba Camp; cscope again 07/2017 --prostate ca screening : last visit w/ urology 12-2016, DRE wnl ; was rec to RTC prn.  No further screening --Height decreased -->   DEXA 05-2014 normal  --Diet exercise : counseled  --Labs: CMP, FLP, TSH --Patient self DC aspirin based on new research.

## 2018-08-11 ENCOUNTER — Other Ambulatory Visit: Payer: Self-pay | Admitting: Internal Medicine

## 2018-08-15 ENCOUNTER — Other Ambulatory Visit: Payer: Self-pay | Admitting: Internal Medicine

## 2018-11-08 ENCOUNTER — Other Ambulatory Visit: Payer: Self-pay | Admitting: Internal Medicine

## 2018-11-12 ENCOUNTER — Telehealth: Payer: Self-pay

## 2018-11-12 DIAGNOSIS — I10 Essential (primary) hypertension: Secondary | ICD-10-CM

## 2018-11-12 MED ORDER — TELMISARTAN 40 MG PO TABS: 40.0000 mg | ORAL_TABLET | Freq: Every day | ORAL | 3 refills | Status: DC

## 2018-11-12 NOTE — Telephone Encounter (Signed)
Advise  patient: We can try telmisartan 40 mg 1 p.o. daily, send a prescription 30, 5 RF  If he is switch, recommend to monitor BPs to be sure they are ok   and come back in 1 month for a BMP only

## 2018-11-12 NOTE — Telephone Encounter (Signed)
Losartan 50mg  on back order- I tried calling pharmacy to see what they did have- on hold for > 5 mins-- can you recommend something else?

## 2018-11-12 NOTE — Telephone Encounter (Signed)
Spoke w/ Pt- informed of need for med change- he currently has approximately 2-3 weeks of losartan 50mg  left. He will call office to schedule lab appt 4 weeks after switching to telmisartan. Rx sent.

## 2018-12-10 ENCOUNTER — Other Ambulatory Visit: Payer: Self-pay | Admitting: Internal Medicine

## 2019-01-22 ENCOUNTER — Other Ambulatory Visit (INDEPENDENT_AMBULATORY_CARE_PROVIDER_SITE_OTHER): Payer: Medicare Other

## 2019-01-22 DIAGNOSIS — I10 Essential (primary) hypertension: Secondary | ICD-10-CM

## 2019-01-22 LAB — BASIC METABOLIC PANEL
BUN: 17 mg/dL (ref 6–23)
CALCIUM: 9.2 mg/dL (ref 8.4–10.5)
CHLORIDE: 105 meq/L (ref 96–112)
CO2: 27 meq/L (ref 19–32)
Creatinine, Ser: 1.07 mg/dL (ref 0.40–1.50)
GFR: 80.48 mL/min (ref >60.00)
Glucose, Bld: 97 mg/dL (ref 70–99)
Potassium: 4.4 mEq/L (ref 3.5–5.1)
SODIUM: 139 meq/L (ref 135–145)

## 2019-01-23 ENCOUNTER — Other Ambulatory Visit: Payer: Medicare Other

## 2019-01-29 ENCOUNTER — Ambulatory Visit: Payer: Medicare Other | Admitting: Internal Medicine

## 2019-02-02 ENCOUNTER — Encounter (HOSPITAL_BASED_OUTPATIENT_CLINIC_OR_DEPARTMENT_OTHER): Payer: Self-pay | Admitting: *Deleted

## 2019-02-02 ENCOUNTER — Encounter: Payer: Self-pay | Admitting: Family

## 2019-02-02 ENCOUNTER — Emergency Department (HOSPITAL_BASED_OUTPATIENT_CLINIC_OR_DEPARTMENT_OTHER)
Admission: EM | Admit: 2019-02-02 | Discharge: 2019-02-02 | Disposition: A | Discharge status: Home / Self Care | Payer: Medicare Other | Attending: Emergency Medicine | Admitting: Emergency Medicine

## 2019-02-02 ENCOUNTER — Ambulatory Visit: Payer: Medicare Other | Admitting: Family

## 2019-02-02 ENCOUNTER — Other Ambulatory Visit: Payer: Self-pay

## 2019-02-02 VITALS — BP 133/58 | HR 72 | Temp 101.1°F | Resp 16 | Ht 69.0 in | Wt 207.0 lb

## 2019-02-02 DIAGNOSIS — R339 Retention of urine, unspecified: Secondary | ICD-10-CM | POA: Diagnosis not present

## 2019-02-02 DIAGNOSIS — I10 Essential (primary) hypertension: Secondary | ICD-10-CM | POA: Insufficient documentation

## 2019-02-02 DIAGNOSIS — N3001 Acute cystitis with hematuria: Secondary | ICD-10-CM | POA: Insufficient documentation

## 2019-02-02 DIAGNOSIS — R300 Dysuria: Secondary | ICD-10-CM | POA: Diagnosis not present

## 2019-02-02 DIAGNOSIS — N3000 Acute cystitis without hematuria: Secondary | ICD-10-CM

## 2019-02-02 DIAGNOSIS — Z79899 Other long term (current) drug therapy: Secondary | ICD-10-CM | POA: Insufficient documentation

## 2019-02-02 LAB — POC URINALSYSI DIPSTICK (AUTOMATED)
BILIRUBIN UA: NEGATIVE
GLUCOSE UA: NEGATIVE
KETONES UA: NEGATIVE
Nitrite, UA: NEGATIVE
Protein, UA: POSITIVE — AB
Spec Grav, UA: 1.01 (ref 1.010–1.025)
Urobilinogen, UA: 1 E.U./dL
pH, UA: 6 (ref 5.0–8.0)

## 2019-02-02 LAB — URINALYSIS, ROUTINE W REFLEX MICROSCOPIC
Bilirubin Urine: NEGATIVE
GLUCOSE, UA: NEGATIVE mg/dL
Ketones, ur: NEGATIVE mg/dL
Nitrite: POSITIVE — AB
PH: 6.5 (ref 5.0–8.0)
Protein, ur: 30 mg/dL — AB

## 2019-02-02 LAB — URINALYSIS, MICROSCOPIC (REFLEX): WBC, UA: >50 WBC/hpf (ref 0–5)

## 2019-02-02 MED ORDER — CEPHALEXIN 500 MG PO CAPS: 500.0000 mg | ORAL_CAPSULE | Freq: Two times a day (BID) | ORAL | 0 refills | Status: DC

## 2019-02-02 NOTE — ED Triage Notes (Signed)
Fever and urinary retention.

## 2019-02-02 NOTE — ED Notes (Signed)
Assisted MD with rectal exam

## 2019-02-02 NOTE — Patient Instructions (Signed)
Please go directly to the ER on the first floor for further evaluation and treatment.

## 2019-02-02 NOTE — Progress Notes (Signed)
Subjective:    Patient ID: Darren Barnes, male    DOB: 01-21-1941, 78 y.o.   MRN: [id number]  HPI   Darren Barnes is a 78 yr old male who presents today with chief complaint of dysuria.  Reports that he is having urinary frequency every hour at night. Only gets a small amount of urine out.    Review of Systems See HPI  Past Medical History:  Diagnosis Date  . ED (erectile dysfunction)   . Glaucoma   . Hx of adenomatous colonic polyps 07/30/2017  . Hyperlipidemia   . Hypertension   . Nocturia    saw urology 2012, was eval, rx vesicare (after several other meds failed)  . OSA on CPAP   . Osteoarthritis   . Sleep apnea   . Thrombocytopenia ([place])    saw hematology, observe     Social History   Socioeconomic History  . Marital status: Married    Spouse name: Not on file  . Number of children: 3  . Years of education: Not on file  . Highest education level: Not on file  Occupational History  . 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 Financial resource strain: Not on file  . Food insecurity:    Worry: Not on file    Inability: Not on file  . Transportation needs:    Medical: Not on file    Non-medical: Not on file  Tobacco Use  . Smoking status: Never Smoker  . Smokeless tobacco: Never Used  Substance and Sexual Activity  . Alcohol use: Yes    Comment: 2 per day on average  . Drug use: No  . Sexual activity: Not on file  Lifestyle  . Physical activity:    Days per week: Not on file    Minutes per session: Not on file  . Stress: Not on file  Relationships  . Social connections:    Talks on phone: Not on file    Gets together: Not on file    Attends religious service: Not on file    Active member of club or organization: Not on file    Attends meetings of clubs or organizations: Not on file    Relationship status: Not on file  . Intimate partner violence:    Fear of current or ex partner: Not on file    Emotionally abused: Not on file     Physically abused: Not on file    Forced sexual activity: Not on file  Other Topics Concern  . Not on file  Social History Narrative   Lives w/ wife   Lost one son, 2 living children, 8 g-kids             Past Surgical History:  Procedure Laterality Date  . anal polyp resection  2008   Dr Debbora Presto  . Benign tumor from Left arm    . KNEE ARTHROSCOPY  2004  . Sinus surgery for benign tumor      Family History  Problem Relation Age of Onset  . Coronary artery disease Father 19       MI at 91  . Hypertension Mother        Jerilynn Mages and F   . Diabetes Mother   . Stroke Mother 41  . Colon cancer Neg Hx   . Prostate cancer Neg Hx   . Esophageal cancer Neg Hx   . Pancreatic cancer Neg Hx   . Rectal  cancer Neg Hx   . Stomach cancer Neg Hx     No Known Allergies  Current Outpatient Medications on File Prior to Visit  Medication Sig Dispense Refill  . diltiazem (CARDIZEM CD) 240 MG 24 hr capsule Take 1 capsule (240 mg total) by mouth 2 (two) times daily. 180 capsule 1  . dorzolamide-timolol (COSOPT) 22.3-6.8 MG/ML ophthalmic solution Place 1 drop into both eyes 2 (two) times daily.    Marland Kitchen LUMIGAN 0.01 % SOLN Place 1 drop into both eyes every evening.    . multivitamin (THERAGRAN) per tablet Take 1 tablet by mouth daily.      . tamsulosin (FLOMAX) 0.4 MG CAPS capsule Take 1 capsule (0.4 mg total) by mouth 2 (two) times daily. 180 capsule 1  . telmisartan (MICARDIS) 40 MG tablet Take 1 tablet (40 mg total) by mouth daily. 30 tablet 3   No current facility-administered medications on file prior to visit.     BP (!) 133/58 (BP Location: Right Arm, Patient Position: Sitting, Cuff Size: Small)   Pulse 72   Temp (!) 101.1 F (38.4 C) (Oral)   Resp 16   Ht 5\' 9"  (1.753 m)   Wt 207 lb (93.9 kg)   SpO2 97%   BMI 30.57 kg/m       Objective:   Physical Exam Constitutional:      General: He is not in acute distress.    Appearance: He is well-developed. He is not diaphoretic.      Comments: Brief episode of rigors noted in exam room  HENT:     Head: Normocephalic and atraumatic.  Cardiovascular:     Rate and Rhythm: Normal rate and regular rhythm.     Heart sounds: No murmur.  Pulmonary:     Effort: Pulmonary effort is normal. No respiratory distress.     Breath sounds: Normal breath sounds. No wheezing or rales.  Abdominal:     Tenderness: There is no abdominal tenderness.     Comments: Difficult to assess for bladder distension due to habitus  Skin:    General: Skin is warm and dry.  Neurological:     Mental Status: He is alert and oriented to person, place, and time.  Psychiatric:        Behavior: Behavior normal.        Thought Content: Thought content normal.           Assessment & Plan:  UTI/suspected urinary retension- UA notes + leukocytes.  hx is suggestive of urinary retention. Also concern for early sepsis. Will refer pt to ED for further evaluation. Report given to Dr. Harmon Pier in the [facility] ED.

## 2019-02-02 NOTE — ED Provider Notes (Signed)
[facility] Provider Note   CSN: [id number] Arrival date & time: 02/02/19  1527     History   Chief Complaint Chief Complaint  Patient presents with  . Urinary Retention    HPI Darren Barnes is a 78 y.o. male.  HPI   Last night woke up every 45 minutes to urinate Blood in the urine at times Fever and sent from Dr's office Prior to that would go every 2-3 hours Urinary frequency Felt like needed to go but would go very little Fevers noted at Dr office but didn't know Low appetite No nausea or vomiting No diarrhea or constipation No change in medication Not feeling like can't get urine out, no retention No pelvic pain    Past Medical History:  Diagnosis Date  . ED (erectile dysfunction)   . Glaucoma   . Hx of adenomatous colonic polyps 07/30/2017  . Hyperlipidemia   . Hypertension   . Nocturia    saw urology 2012, was eval, rx vesicare (after several other meds failed)  . OSA on CPAP   . Osteoarthritis   . Sleep apnea   . Thrombocytopenia ([place])    saw hematology, observe    Patient Active Problem List   Diagnosis Date Noted  . Hx of adenomatous colonic polyps 07/30/2017  . PCP NOTES >>> 10/26/2015  . Rhinitis 08/09/2014  . OSA (obstructive sleep apnea) 11/13/2012  . Hemorrhoids 10/27/2012  . Annual physical exam 05/15/2011  . LUTS-Nocturia 05/03/2009  . Osteoarthritis 10/06/2008  . Thrombocytopenia ([place]) 09/12/2007  . Dyslipidemia 05/01/2007  . ERECTILE DYSFUNCTION 05/01/2007  . Essential hypertension 05/01/2007  . DIVERTICULOSIS, COLON 05/01/2007    Past Surgical History:  Procedure Laterality Date  . anal polyp resection  2008   Dr Debbora Presto  . Benign tumor from Left arm    . KNEE ARTHROSCOPY  2004  . Sinus surgery for benign tumor          Home Medications    Prior to Admission medications   Medication Sig Start Date End Date Taking? Authorizing Provider  cephALEXin (KEFLEX) 500 MG capsule Take 1 capsule  (500 mg total) by mouth 2 (two) times daily for 7 days. 02/02/19 02/09/19  Gareth Morgan, MD  diltiazem (CARDIZEM CD) 240 MG 24 hr capsule Take 1 capsule (240 mg total) by mouth 2 (two) times daily. 11/10/18   Colon Branch, MD  dorzolamide-timolol (COSOPT) 22.3-6.8 MG/ML ophthalmic solution Place 1 drop into both eyes 2 (two) times daily.    [provider]  LUMIGAN 0.01 % SOLN Place 1 drop into both eyes every evening. 12/14/15   [provider]  multivitamin [facility]) per tablet Take 1 tablet by mouth daily.      [provider]  tamsulosin (FLOMAX) 0.4 MG CAPS capsule Take 1 capsule (0.4 mg total) by mouth 2 (two) times daily. 12/10/18   Colon Branch, MD  telmisartan (MICARDIS) 40 MG tablet Take 1 tablet (40 mg total) by mouth daily. 11/12/18   Colon Branch, MD    Family History Family History  Problem Relation Age of Onset  . Coronary artery disease Father 55       MI at 43  . Hypertension Mother        Jerilynn Mages and F   . Diabetes Mother   . Stroke Mother 61  . Colon cancer Neg Hx   . Prostate cancer Neg Hx   . Esophageal cancer Neg Hx   . Pancreatic cancer  Neg Hx   . Rectal cancer Neg Hx   . Stomach cancer Neg Hx     Social History Social History   Tobacco Use  . Smoking status: Never Smoker  . Smokeless tobacco: Never Used  Substance Use Topics  . Alcohol use: Yes    Comment: 2 per day on average  . Drug use: No     Allergies   Patient has no known allergies.   Review of Systems Review of Systems  Constitutional: Positive for appetite change, fatigue and fever.  HENT: Negative for sore throat.   Eyes: Negative for visual disturbance.  Respiratory: Negative for shortness of breath.   Cardiovascular: Negative for chest pain.  Gastrointestinal: Negative for abdominal pain, diarrhea, nausea and vomiting.  Genitourinary: Positive for dysuria, frequency and urgency. Negative for difficulty urinating (frequency and hesitancy but does not feel  retention).  Musculoskeletal: Negative for back pain and neck stiffness.  Skin: Negative for rash.  Neurological: Negative for syncope and headaches.     Physical Exam Updated Vital Signs BP (!) 145/60   Pulse 70   Temp 99.6 F (37.6 C) (Oral)   Resp 18   Ht 5\' 9"  (1.753 m)   Wt 93.8 kg   SpO2 97%   BMI 30.54 kg/m   Physical Exam Vitals signs and nursing note reviewed.  Constitutional:      General: He is not in acute distress.    Appearance: He is well-developed. He is not diaphoretic.  HENT:     Head: Normocephalic and atraumatic.  Eyes:     Conjunctiva/sclera: Conjunctivae normal.  Neck:     Musculoskeletal: Normal range of motion.  Cardiovascular:     Rate and Rhythm: Normal rate and regular rhythm.  Pulmonary:     Effort: Pulmonary effort is normal. No respiratory distress.  Abdominal:     General: There is no distension.     Palpations: Abdomen is soft.     Tenderness: There is no abdominal tenderness. There is no guarding.     Comments: No CVA tenderness  Genitourinary:    Comments: No prostate tenderness Skin:    General: Skin is warm and dry.  Neurological:     Mental Status: He is alert and oriented to person, place, and time.      ED Treatments / Results  Labs (all labs ordered are listed, but only abnormal results are displayed) Labs Reviewed  URINALYSIS, ROUTINE W REFLEX MICROSCOPIC - Abnormal; Notable for the following components:      Result Value   Color, Urine AMBER (*)    APPearance CLOUDY (*)    Specific Gravity, Urine <1.005 (*)    Hgb urine dipstick SMALL (*)    Protein, ur 30 (*)    Nitrite POSITIVE (*)    Leukocytes, UA LARGE (*)    All other components within normal limits  URINALYSIS, MICROSCOPIC (REFLEX) - Abnormal; Notable for the following components:   Bacteria, UA MANY (*)    All other components within normal limits    EKG None  Radiology No results found.  Procedures Procedures (including critical care  time)  Medications Ordered in ED Medications - No data to display   Initial Impression / Assessment and Plan / ED Course  I have reviewed the triage vital signs and the nursing notes.  Pertinent labs & imaging results that were available during my care of the patient were reviewed by me and considered in my medical decision making (see chart for details).  78 year old male with history of hypertension, hyperlipidemia presents with concern for dysuria, urinary frequency and hesitancy.  He had been seen at his primary care physician's office and was sent down for possible urinary retention and fever.  Post void residual in the emergency department was 70 cc, no sign of true urinary retention.  The symptoms he is describing are most consistent with urgency and frequency of urinary tract infection.  He has no pelvic pain, no prostate tenderness on exam, and at this time have low suspicion for prostatitis.    He has no flank pain, no abdominal pain, no nausea, no vomiting, normal heart rate and blood pressure 145/60, he is well-appearing, without true urinary retention.  His symptoms began last night, and given appearance and vital signs feel he is appropriate for outpatient therapy for urinary tract infection and discussed in detail strict return precautions.  Do not suspect any significant change in labs to lead to change in disposition and feel he is appropriate for discharge with antibiotics and close follow up. He is scheduled to see Dr. Larose Kells tomorrow. Patient discharged in stable condition with understanding of reasons to return.   Final Clinical Impressions(s) / ED Diagnoses   Final diagnoses:  Acute cystitis with hematuria    ED Discharge Orders         Ordered    cephALEXin (KEFLEX) 500 MG capsule  2 times daily     02/02/19 1619           Gareth Morgan, MD 02/02/19 1625

## 2019-02-03 ENCOUNTER — Encounter: Payer: Self-pay | Admitting: Internal Medicine

## 2019-02-03 ENCOUNTER — Ambulatory Visit: Payer: Medicare Other | Admitting: Internal Medicine

## 2019-02-03 ENCOUNTER — Telehealth: Payer: Self-pay

## 2019-02-03 VITALS — BP 118/64 | HR 69 | Temp 98.8°F | Resp 16 | Ht 69.0 in | Wt 207.0 lb

## 2019-02-03 DIAGNOSIS — I10 Essential (primary) hypertension: Secondary | ICD-10-CM | POA: Diagnosis not present

## 2019-02-03 DIAGNOSIS — R319 Hematuria, unspecified: Secondary | ICD-10-CM

## 2019-02-03 DIAGNOSIS — N390 Urinary tract infection, site not specified: Secondary | ICD-10-CM | POA: Diagnosis not present

## 2019-02-03 DIAGNOSIS — N410 Acute prostatitis: Secondary | ICD-10-CM

## 2019-02-03 LAB — CBC WITH DIFFERENTIAL/PLATELET
BASOS ABS: 0 10*3/uL (ref 0.0–0.1)
Basophils Relative: 0.2 % (ref 0.0–3.0)
EOS ABS: 0 10*3/uL (ref 0.0–0.7)
Eosinophils Relative: 0 % (ref 0.0–5.0)
HCT: 40.4 % (ref 39.0–52.0)
Hemoglobin: 13.2 g/dL (ref 13.0–17.0)
Lymphocytes Relative: 3.3 % — ABNORMAL LOW (ref 12.0–46.0)
Lymphs Abs: 0.6 10*3/uL — ABNORMAL LOW (ref 0.7–4.0)
MCHC: 32.7 g/dL (ref 30.0–36.0)
MCV: 86 fl (ref 78.0–100.0)
Monocytes Absolute: 1.2 10*3/uL — ABNORMAL HIGH (ref 0.1–1.0)
Monocytes Relative: 6.3 % (ref 3.0–12.0)
Neutro Abs: 17.3 10*3/uL — ABNORMAL HIGH (ref 1.4–7.7)
Neutrophils Relative %: 90.2 % — ABNORMAL HIGH (ref 43.0–77.0)
Platelets: 76 10*3/uL — ABNORMAL LOW (ref 150.0–400.0)
RBC: 4.7 Mil/uL (ref 4.22–5.81)
RDW: 12.9 % (ref 11.5–15.5)
WBC: 19.2 10*3/uL (ref 4.0–10.5)

## 2019-02-03 LAB — PSA: PSA: 19.66 ng/mL — ABNORMAL HIGH (ref 0.10–4.00)

## 2019-02-03 NOTE — Telephone Encounter (Signed)
Critical Lab: WBC 19.2!  PCP verbally informed.

## 2019-02-03 NOTE — Patient Instructions (Addendum)
Please schedule Medicare Wellness with Glenard Haring.   GO TO THE LAB : Get the blood work     GO TO THE FRONT DESK Schedule your next appointment   a physical exam by June 2020

## 2019-02-03 NOTE — Telephone Encounter (Signed)
Noted, will wait for the urine culture.  Please be sure a urine culture was sent.

## 2019-02-03 NOTE — Progress Notes (Signed)
Pre visit review using our clinic review tool, if applicable. No additional management support is needed unless otherwise documented below in the visit note. 

## 2019-02-03 NOTE — Progress Notes (Signed)
Subjective:    Patient ID: Darren Barnes, male    DOB: Nov 22, 1941, 78 y.o.   MRN: [id number]  DOS:  02/03/2019 Type of visit - description:  Follow-up The patient is here for a routine follow-up however yesterday was seen here with L UTS, possible urinary retention. Was sent to the emergency room. In the emergency room, a UA was positive for leukocytes.  A prostate exam showing no tenderness, so the ER MD felt that the patient was not on urinary retention and d/c pt home w/ antibiotics.   Review of Systems Since he left the ER, he feels better. No fever chills No nausea or vomiting He had urinary frequency and some difficulty but that has improved.   Past Medical History:  Diagnosis Date  . ED (erectile dysfunction)   . Glaucoma   . Hx of adenomatous colonic polyps 07/30/2017  . Hyperlipidemia   . Hypertension   . Nocturia    saw urology 2012, was eval, rx vesicare (after several other meds failed)  . OSA on CPAP   . Osteoarthritis   . Sleep apnea   . Thrombocytopenia ([place])    saw hematology, observe    Past Surgical History:  Procedure Laterality Date  . anal polyp resection  2008   Dr Debbora Presto  . Benign tumor from Left arm    . KNEE ARTHROSCOPY  2004  . Sinus surgery for benign tumor      Social History   Socioeconomic History  . Marital status: Married    Spouse name: Not on file  . Number of children: 3  . Years of education: Not on file  . Highest education level: Not on file  Occupational History  . 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 Financial resource strain: Not on file  . Food insecurity:    Worry: Not on file    Inability: Not on file  . Transportation needs:    Medical: Not on file    Non-medical: Not on file  Tobacco Use  . Smoking status: Never Smoker  . Smokeless tobacco: Never Used  Substance and Sexual Activity  . Alcohol use: Yes    Comment: 2 per day on average  . Drug use: No  . Sexual activity: Not  on file  Lifestyle  . Physical activity:    Days per week: Not on file    Minutes per session: Not on file  . Stress: Not on file  Relationships  . Social connections:    Talks on phone: Not on file    Gets together: Not on file    Attends religious service: Not on file    Active member of club or organization: Not on file    Attends meetings of clubs or organizations: Not on file    Relationship status: Not on file  . Intimate partner violence:    Fear of current or ex partner: Not on file    Emotionally abused: Not on file    Physically abused: Not on file    Forced sexual activity: Not on file  Other Topics Concern  . Not on file  Social History Narrative   Lives w/ wife   Lost one son, 2 living children, 8 g-kids               Allergies as of 02/03/2019   No Known Allergies     Medication List       Accurate as  of February 03, 2019 11:59 PM. Always use your most recent med list.        cephALEXin 500 MG capsule Commonly known as:  KEFLEX Take 1 capsule (500 mg total) by mouth 2 (two) times daily for 7 days.   diltiazem 240 MG 24 hr capsule Commonly known as:  CARDIZEM CD Take 1 capsule (240 mg total) by mouth 2 (two) times daily.   dorzolamide-timolol 22.3-6.8 MG/ML ophthalmic solution Commonly known as:  COSOPT Place 1 drop into both eyes 2 (two) times daily.   LUMIGAN 0.01 % Soln Generic drug:  bimatoprost Place 1 drop into both eyes every evening.   multivitamin per tablet Take 1 tablet by mouth daily.   tamsulosin 0.4 MG Caps capsule Commonly known as:  FLOMAX Take 1 capsule (0.4 mg total) by mouth 2 (two) times daily.   telmisartan 40 MG tablet Commonly known as:  MICARDIS Take 1 tablet (40 mg total) by mouth daily.           Objective:   Physical Exam BP 118/64 (BP Location: Left Arm, Patient Position: Sitting, Cuff Size: Normal)   Pulse 69   Temp 98.8 F (37.1 C) (Oral)   Resp 16   Ht 5\' 9"  (1.753 m)   Wt 207 lb (93.9 kg)    SpO2 98%   BMI 30.57 kg/m  General:   Well developed, NAD, BMI noted.  HEENT:  Normocephalic . Face symmetric, atraumatic Lungs:  CTA B Normal respiratory effort, no intercostal retractions, no accessory muscle use. Heart: RRR,  no murmur.  no pretibial edema bilaterally  Abdomen:  Not distended, soft, non-tender. No rebound or rigidity.   Skin: Not pale. Not jaundice Neurologic:  alert & oriented X3.  Speech normal, gait appropriate for age and unassisted Psych--  Cognition and judgment appear intact.  Cooperative with normal attention span and concentration.  Behavior appropriate. No anxious or depressed appearing.     Assessment     Assessment   HTN- on Cardizem for a while, added losartan 10-2015 Hyperlipidemia ED DJD- s/p knee injections (flexogenic) ~ 2016; I signed a parking permit OSA : not using  CPAP as off 10-2017 Glaucoma Abdominal wall mass see physical exam 05-25-2016 H/o Thrombocytopenia, saw hematology, rx  observation GU: --H/o LUTS- Nocturia  Saw  Urologist ~2012 underwent full workup >> dx BPH and detrusor instability, failed several medications --Re-eval by urology:12-2016 Recurrence cystitis, chronic prostatitis  PLAN: HTN: Since the last visit, he was switched to Micardis, ambulatory BPs when checked are normal.  BMP was satisfactory.  Plan: Continue Micardis and Cardizem.  Check a CBC. UTI: Was seen yesterday with LUTS and fever, went to the ER, dx w/  A UTI, no urinary retention.  Urine culture pending.  He was started on Keflex and feeling better.  Plan: Wait for UC X, check a PSA.  If PSA is elevated he will need more prolonged antibiotic therapy. Addendum: Urine culture not sent, the patient has prostatitis based on the elevated PSA and white count.  We will get new UA, urine culture understanding that he has already taken Keflex.  Will send bactrim x 4 weeks.  See phone note RTC 05-2019 CPX

## 2019-02-03 NOTE — Telephone Encounter (Signed)
The patient has had UTI/prostatitis;  PSA is  elevated. He was not toxic this morning, vital signs were stable. Elevated WBC likely due to infection.  He is on antibiotics, urine culture pending.

## 2019-02-03 NOTE — Telephone Encounter (Signed)
Can't tell if it was send from ED or not.

## 2019-02-04 ENCOUNTER — Other Ambulatory Visit (INDEPENDENT_AMBULATORY_CARE_PROVIDER_SITE_OTHER): Payer: Medicare Other

## 2019-02-04 ENCOUNTER — Telehealth: Payer: Self-pay | Admitting: Internal Medicine

## 2019-02-04 DIAGNOSIS — R319 Hematuria, unspecified: Principal | ICD-10-CM

## 2019-02-04 DIAGNOSIS — N390 Urinary tract infection, site not specified: Secondary | ICD-10-CM

## 2019-02-04 LAB — URINALYSIS, ROUTINE W REFLEX MICROSCOPIC
Bilirubin Urine: NEGATIVE
Ketones, ur: NEGATIVE
Nitrite: NEGATIVE
Specific Gravity, Urine: <=1.005 — AB (ref 1.000–1.030)
Total Protein, Urine: NEGATIVE
Urine Glucose: NEGATIVE
Urobilinogen, UA: 1 (ref 0.0–1.0)
pH: 6 (ref 5.0–8.0)

## 2019-02-04 MED ORDER — SULFAMETHOXAZOLE-TRIMETHOPRIM 800-160 MG PO TABS: 1.0000 | ORAL_TABLET | Freq: Two times a day (BID) | ORAL | 0 refills | Status: DC

## 2019-02-04 NOTE — Assessment & Plan Note (Signed)
HTN: Since the last visit, he was switched to Micardis, ambulatory BPs when checked are normal.  BMP was satisfactory.  Plan: Continue Micardis and Cardizem.  Check a CBC. UTI: Was seen yesterday with LUTS and fever, went to the ER, dx w/  A UTI, no urinary retention.  Urine culture pending.  He was started on Keflex and feeling better.  Plan: Wait for UC X, check a PSA.  If PSA is elevated he will need more prolonged antibiotic therapy. Addendum: Urine culture not sent, the patient has prostatitis based on the elevated PSA and white count.  We will get new UA, urine culture understanding that he has already taken Keflex.  Will send bactrim x 4 weeks.  See phone note RTC 05-2019 CPX

## 2019-02-04 NOTE — Telephone Encounter (Signed)
PCP LMOM informing Pt to return call. Okay for PEC to discuss.   -Keflex d/c, Bactrim DS sent -UA, urine culture ordered.

## 2019-02-04 NOTE — Telephone Encounter (Signed)
Spoke w/ Pt- he will change to Bactrim DS and come by office to drop off urine for culture. He informed he was still running low grade fever- informed that he may continue to run a slight fever if abx is not covering the infection- informed that this is why its important he come back to give [place] another sample. Pt verbalized understanding.

## 2019-02-04 NOTE — Telephone Encounter (Signed)
Urine culture was not sent.  Labs from yesterday's office visit show an elevated white count and PSA. DX prostatitis --Ask patient to come today and provide  UA UCX dx UTI --Stop Keflex ---Send Bactrim DS 1 p.o. twice daily #60.  No refills --OK to PEC to discuss

## 2019-02-05 LAB — URINE CULTURE
MICRO NUMBER:: 186143
RESULT: NO GROWTH
SPECIMEN QUALITY:: ADEQUATE

## 2019-02-09 NOTE — Telephone Encounter (Addendum)
Patients wife called in and stated pt is not eating much and sleeping a lot and not as energetic as he use to be , she states he has been taking his antibiotic as told. She stated he isnt as verbal as he use to be. Patients wife stated he doesn't know she is calling.  CB# [phone]

## 2019-02-10 ENCOUNTER — Telehealth: Payer: Self-pay | Admitting: Internal Medicine

## 2019-02-10 NOTE — Telephone Encounter (Signed)
Wife called, concerned about the pt, see below. Spoke w/ the pt today. Reports good compliance with Bactrim Rx for 30 days, he is less fatigued than before, during the daytime he simply is taking it easy.  No fever or chills. He has an appointment for a follow-up 03/04/2019, I encouraged him to keep the appointment, plan to recheck his CBC and PSA at that time. === Patients wife called in and stated pt is not eating much and sleeping a lot and not as energetic as he use to be , she states he has been taking his antibiotic as told. She stated he isnt as verbal as he use to be. Patients wife stated he doesn't know she is calling.  CB# [phone]

## 2019-03-04 ENCOUNTER — Other Ambulatory Visit: Payer: Self-pay

## 2019-03-04 ENCOUNTER — Encounter: Payer: Self-pay | Admitting: Internal Medicine

## 2019-03-04 ENCOUNTER — Ambulatory Visit: Payer: Medicare Other | Admitting: Internal Medicine

## 2019-03-04 VITALS — BP 126/62 | HR 51 | Temp 98.1°F | Resp 16 | Ht 69.0 in | Wt 199.5 lb

## 2019-03-04 DIAGNOSIS — N410 Acute prostatitis: Secondary | ICD-10-CM | POA: Diagnosis not present

## 2019-03-04 DIAGNOSIS — I10 Essential (primary) hypertension: Secondary | ICD-10-CM

## 2019-03-04 NOTE — Patient Instructions (Signed)
Please schedule Medicare Wellness with [facility].  

## 2019-03-04 NOTE — Progress Notes (Signed)
Subjective:    Patient ID: Darren Barnes, male    DOB: 1941/02/18, 78 y.o.   MRN: [id number]  DOS:  03/04/2019 Type of visit - description: Follow-up Prostatitis: Good compliance with antibiotics.  No apparent problems, feels well HTN: Good compliance with meds, ambulatory BPs when checked are normal  Review of Systems No rash Denies fever chills No nausea, vomiting, diarrhea No dysuria, no difficulty urinating, no abdominal pain.  Past Medical History:  Diagnosis Date  . ED (erectile dysfunction)   . Glaucoma   . Hx of adenomatous colonic polyps 07/30/2017  . Hyperlipidemia   . Hypertension   . Nocturia    saw urology 2012, was eval, rx vesicare (after several other meds failed)  . OSA on CPAP   . Osteoarthritis   . Sleep apnea   . Thrombocytopenia ([place])    saw hematology, observe    Past Surgical History:  Procedure Laterality Date  . anal polyp resection  2008   Dr Debbora Presto  . Benign tumor from Left arm    . KNEE ARTHROSCOPY  2004  . Sinus surgery for benign tumor      Social History   Socioeconomic History  . Marital status: Married    Spouse name: Not on file  . Number of children: 3  . Years of education: Not on file  . Highest education level: Not on file  Occupational History  . 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 Financial resource strain: Not on file  . Food insecurity:    Worry: Not on file    Inability: Not on file  . Transportation needs:    Medical: Not on file    Non-medical: Not on file  Tobacco Use  . Smoking status: Never Smoker  . Smokeless tobacco: Never Used  Substance and Sexual Activity  . Alcohol use: Yes    Comment: 2 per day on average  . Drug use: No  . Sexual activity: Not on file  Lifestyle  . Physical activity:    Days per week: Not on file    Minutes per session: Not on file  . Stress: Not on file  Relationships  . Social connections:    Talks on phone: Not on file    Gets together:  Not on file    Attends religious service: Not on file    Active member of club or organization: Not on file    Attends meetings of clubs or organizations: Not on file    Relationship status: Not on file  . Intimate partner violence:    Fear of current or ex partner: Not on file    Emotionally abused: Not on file    Physically abused: Not on file    Forced sexual activity: Not on file  Other Topics Concern  . Not on file  Social History Narrative   Lives w/ wife   Lost one son, 2 living children, 8 g-kids               Allergies as of 03/04/2019   No Known Allergies     Medication List       Accurate as of March 04, 2019  1:12 PM. Always use your most recent med list.        diltiazem 240 MG 24 hr capsule Commonly known as:  CARDIZEM CD Take 1 capsule (240 mg total) by mouth 2 (two) times daily.   dorzolamide-timolol 22.3-6.8 MG/ML ophthalmic  solution Commonly known as:  COSOPT Place 1 drop into both eyes 2 (two) times daily.   Lumigan 0.01 % Soln Generic drug:  bimatoprost Place 1 drop into both eyes every evening.   multivitamin per tablet Take 1 tablet by mouth daily.   sulfamethoxazole-trimethoprim 800-160 MG tablet Commonly known as:  BACTRIM DS,SEPTRA DS Take 1 tablet by mouth 2 (two) times daily.   tamsulosin 0.4 MG Caps capsule Commonly known as:  FLOMAX Take 1 capsule (0.4 mg total) by mouth 2 (two) times daily.   telmisartan 40 MG tablet Commonly known as:  MICARDIS Take 1 tablet (40 mg total) by mouth daily.           Objective:   Physical Exam BP 126/62 (BP Location: Left Arm, Patient Position: Sitting, Cuff Size: Small)   Pulse (!) 51   Temp 98.1 F (36.7 C) (Oral)   Resp 16   Ht 5\' 9"  (1.753 m)   Wt 199 lb 8 oz (90.5 kg)   SpO2 96%   BMI 29.46 kg/m  General:   Well developed, NAD, BMI noted.  HEENT:  Normocephalic . Face symmetric, atraumatic Lungs:  CTA B Normal respiratory effort, no intercostal retractions, no accessory  muscle use. Heart: RRR,  no murmur.  no pretibial edema bilaterally  Abdomen:  Not distended, soft, non-tender. No rebound or rigidity.   Skin: Not pale. Not jaundice Neurologic:  alert & oriented X3.  Speech normal, gait appropriate for age and unassisted Psych--  Cognition and judgment appear intact.  Cooperative with normal attention span and concentration.  Behavior appropriate. No anxious or depressed appearing.     Assessment     Assessment   HTN- on Cardizem for a while, added losartan 10-2015 Hyperlipidemia ED DJD- s/p knee injections (flexogenic) ~ 2016; I signed a parking permit OSA : not using  CPAP as off 10-2017 Glaucoma Abdominal wall mass see physical exam 05-25-2016 H/o Thrombocytopenia, saw hematology, rx  observation GU: --H/o LUTS- Nocturia  Saw  Urologist ~2012 underwent full workup >> dx BPH and detrusor instability, failed several medications --Re-eval by urology:12-2016 Recurrence cystitis, chronic prostatitis  PLAN: Prostatitis: At the last visit, PSA came elevated at 19.6, he was prescribed Bactrim, good compliance and tolerance without apparent complications.  Clinically improved.  Plan: Finish antibiotics, will recheck a PSA and a CBC when he comes back HTN: Good med compliance, last BMP satisfactory, ambulatory BPs normal.  No change RTC 05/2019 as recommended.

## 2019-03-04 NOTE — Progress Notes (Signed)
Pre visit review using our clinic review tool, if applicable. No additional management support is needed unless otherwise documented below in the visit note. 

## 2019-03-05 NOTE — Assessment & Plan Note (Signed)
Prostatitis: At the last visit, PSA came elevated at 19.6, he was prescribed Bactrim, good compliance and tolerance without apparent complications.  Clinically improved.  Plan: Finish antibiotics, will recheck a PSA and a CBC when he comes back HTN: Good med compliance, last BMP satisfactory, ambulatory BPs normal.  No change RTC 05/2019 as recommended.

## 2019-04-09 ENCOUNTER — Other Ambulatory Visit: Payer: Self-pay | Admitting: Internal Medicine

## 2019-04-20 ENCOUNTER — Other Ambulatory Visit: Payer: Self-pay | Admitting: Internal Medicine

## 2019-04-27 ENCOUNTER — Telehealth: Payer: Self-pay

## 2019-04-27 MED ORDER — DILTIAZEM HCL ER COATED BEADS 240 MG PO CP24: 240.0000 mg | ORAL_CAPSULE | Freq: Two times a day (BID) | ORAL | 1 refills | Status: DC

## 2019-04-27 NOTE — Telephone Encounter (Signed)
Copied from [place] [phone]. Topic: General - Other >> Apr 27, 2019 11:12 AM Antonieta Iba C wrote: Reason for CRM: pt says that he received his Rx for diltiazem (CARTIA XT) 240 MG 24 hr capsule . Pt says that his instructions are to take 1 a day, pt says that he was instructed and has always taken 2 a day.   Pt would like to discuss further.   CB:[phone]

## 2019-04-27 NOTE — Telephone Encounter (Signed)
Quantity #[id number] sent to pharmacy- enough for bid. Rx did state once daily by mistake. Rx resent to [facility] w/ correct sig.

## 2019-06-03 ENCOUNTER — Other Ambulatory Visit: Payer: Self-pay | Admitting: Internal Medicine

## 2019-06-11 ENCOUNTER — Encounter: Payer: Medicare Other | Admitting: Internal Medicine

## 2019-06-19 ENCOUNTER — Other Ambulatory Visit: Payer: Self-pay

## 2019-06-19 ENCOUNTER — Ambulatory Visit: Payer: Medicare Other | Admitting: Internal Medicine

## 2019-06-19 ENCOUNTER — Encounter: Payer: Self-pay | Admitting: Internal Medicine

## 2019-06-19 VITALS — BP 127/58 | HR 49 | Temp 98.1°F | Resp 16 | Ht 69.0 in | Wt 200.5 lb

## 2019-06-19 DIAGNOSIS — Z87438 Personal history of other diseases of male genital organs: Secondary | ICD-10-CM

## 2019-06-19 DIAGNOSIS — Z0000 Encounter for general adult medical examination without abnormal findings: Secondary | ICD-10-CM | POA: Diagnosis not present

## 2019-06-19 DIAGNOSIS — E785 Hyperlipidemia, unspecified: Secondary | ICD-10-CM | POA: Diagnosis not present

## 2019-06-19 LAB — BASIC METABOLIC PANEL
BUN: 14 mg/dL (ref 6–23)
CO2: 25 mEq/L (ref 19–32)
Calcium: 8.9 mg/dL (ref 8.4–10.5)
Chloride: 106 mEq/L (ref 96–112)
Creatinine, Ser: 1.13 mg/dL (ref 0.40–1.50)
GFR: 75.49 mL/min (ref >60.00)
Glucose, Bld: 85 mg/dL (ref 70–99)
Potassium: 4.2 mEq/L (ref 3.5–5.1)
Sodium: 139 mEq/L (ref 135–145)

## 2019-06-19 LAB — CBC WITH DIFFERENTIAL/PLATELET
Basophils Absolute: 0 10*3/uL (ref 0.0–0.1)
Basophils Relative: 0.3 % (ref 0.0–3.0)
Eosinophils Absolute: 0.2 10*3/uL (ref 0.0–0.7)
Eosinophils Relative: 4.1 % (ref 0.0–5.0)
HCT: 44.9 % (ref 39.0–52.0)
Hemoglobin: 14.3 g/dL (ref 13.0–17.0)
Lymphocytes Relative: 22.2 % (ref 12.0–46.0)
Lymphs Abs: 1.3 10*3/uL (ref 0.7–4.0)
MCHC: 31.9 g/dL (ref 30.0–36.0)
MCV: 87.8 fl (ref 78.0–100.0)
Monocytes Absolute: 0.6 10*3/uL (ref 0.1–1.0)
Monocytes Relative: 10.8 % (ref 3.0–12.0)
Neutro Abs: 3.8 10*3/uL (ref 1.4–7.7)
Neutrophils Relative %: 62.6 % (ref 43.0–77.0)
Platelets: 102 10*3/uL — ABNORMAL LOW (ref 150.0–400.0)
RBC: 5.11 Mil/uL (ref 4.22–5.81)
RDW: 13 % (ref 11.5–15.5)
WBC: 6 10*3/uL (ref 4.0–10.5)

## 2019-06-19 LAB — LIPID PANEL
Cholesterol: 179 mg/dL (ref 0–200)
HDL: 49.1 mg/dL (ref >39.00)
LDL Cholesterol: 114 mg/dL — ABNORMAL HIGH (ref 0–99)
NonHDL: 130.33
Total CHOL/HDL Ratio: 4
Triglycerides: 80 mg/dL (ref 0.0–149.0)
VLDL: 16 mg/dL (ref 0.0–40.0)

## 2019-06-19 LAB — ALT: ALT: 22 U/L (ref 0–53)

## 2019-06-19 LAB — AST: AST: 18 U/L (ref 0–37)

## 2019-06-19 LAB — PSA: PSA: 3.69 ng/mL (ref 0.10–4.00)

## 2019-06-19 MED ORDER — SHINGRIX 50 MCG/0.5ML IM SUSR: 0.5000 mL | Freq: Once | INTRAMUSCULAR | 1 refills | Status: AC

## 2019-06-19 NOTE — Progress Notes (Signed)
Pre visit review using our clinic review tool, if applicable. No additional management support is needed unless otherwise documented below in the visit note. 

## 2019-06-19 NOTE — Assessment & Plan Note (Addendum)
--  Td 2015; PNM 23: 2005 and  2010;  prevnar 2015; zostavax 2010; rec flu shot by sept 2020; shingrex RX printed   --CCS: Cscope 6-08  (Dr Carlean Purl) neg but had a polypoid anal lesion, lesion removed by Dr Bubba Camp; cscope again 07/2017 and per GI letter no recall due to age --prostate ca screening :Had prostatitis recently, PSA was elevated, now asymptomatic, DRE today benign, check a PSA.   --Height decreased -->   DEXA 05-2014 normal  --Diet exercise : counseled  --Labs: BMP, AST, , FLP, CBC, PSA

## 2019-06-19 NOTE — Patient Instructions (Signed)
Get the blood work    [facility]ell Schedule your next appointment  For a physical in 1 year    Get a flu shot in September or October  Consider getting Shingrix, see prescription  Check the  blood pressure 2 or 3 times a month Be sure your blood pressure is between 110/65 and  135/85. If it is consistently higher or lower, let me know

## 2019-06-19 NOTE — Progress Notes (Signed)
Subjective:    Patient ID: Darren Barnes, male    DOB: [birth year], 78 y.o.   MRN: [id number]  DOS:  06/19/2019 Type of visit - description: CPX Here for CPX in general feeling well. He did have prostatitis, completely recuperated, review of systems is negative  Review of Systems  A 14 point review of systems is negative    Past Medical History:  Diagnosis Date  . ED (erectile dysfunction)   . Glaucoma   . Hx of adenomatous colonic polyps 07/30/2017  . Hyperlipidemia   . Hypertension   . Nocturia    saw urology 2012, was eval, rx vesicare (after several other meds failed)  . OSA on CPAP   . Osteoarthritis   . Sleep apnea   . Thrombocytopenia ([place])    saw hematology, observe    Past Surgical History:  Procedure Laterality Date  . anal polyp resection  2008   Dr Debbora Presto  . Benign tumor from Left arm    . KNEE ARTHROSCOPY  2004  . Sinus surgery for benign tumor      Social History   Socioeconomic History  . Marital status: Married    Spouse name: Not on file  . Number of children: 3  . Years of education: Not on file  . Highest education level: Not on file  Occupational History  . 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 Financial resource strain: Not on file  . Food insecurity    Worry: Not on file    Inability: Not on file  . Transportation needs    Medical: Not on file    Non-medical: Not on file  Tobacco Use  . Smoking status: Never Smoker  . Smokeless tobacco: Never Used  Substance and Sexual Activity  . Alcohol use: Yes    Comment: 2 per day on average  . Drug use: No  . Sexual activity: Not on file  Lifestyle  . Physical activity    Days per week: Not on file    Minutes per session: Not on file  . Stress: Not on file  Relationships  . Social Herbalist on phone: Not on file    Gets together: Not on file    Attends religious service: Not on file    Active member of club or organization: Not on file   Attends meetings of clubs or organizations: Not on file    Relationship status: Not on file  . Intimate partner violence    Fear of current or ex partner: Not on file    Emotionally abused: Not on file    Physically abused: Not on file    Forced sexual activity: Not on file  Other Topics Concern  . Not on file  Social History Narrative   Lives w/ wife   Lost one son, 2 living children, 8 g-kids            Family History  Problem Relation Age of Onset  . Coronary artery disease Father 73       MI at 16  . Hypertension Mother        Jerilynn Mages and F   . Diabetes Mother   . Stroke Mother 69  . Colon cancer Neg Hx   . Prostate cancer Neg Hx   . Esophageal cancer Neg Hx   . Pancreatic cancer Neg Hx   . Rectal cancer Neg Hx   . Stomach cancer Neg  Hx       Allergies as of 06/19/2019   No Known Allergies     Medication List       Accurate as of June 19, 2019 11:59 PM. If you have any questions, ask your nurse or doctor.        STOP taking these medications   sulfamethoxazole-trimethoprim 800-160 MG tablet Commonly known as: BACTRIM DS Stopped by: Kathlene November, MD     TAKE these medications   diltiazem 240 MG 24 hr capsule Commonly known as: Cartia XT Take 1 capsule (240 mg total) by mouth 2 (two) times a day.   dorzolamide-timolol 22.3-6.8 MG/ML ophthalmic solution Commonly known as: COSOPT Place 1 drop into both eyes 2 (two) times daily.   Lumigan 0.01 % Soln Generic drug: bimatoprost Place 1 drop into both eyes every evening.   multivitamin per tablet Take 1 tablet by mouth daily.   Shingrix injection Generic drug: Zoster Vaccine Adjuvanted Inject 0.5 mLs into the muscle once for 1 dose. Started by: Kathlene November, MD   tamsulosin 0.4 MG Caps capsule Commonly known as: FLOMAX Take 1 capsule (0.4 mg total) by mouth 2 (two) times a day.   telmisartan 40 MG tablet Commonly known as: MICARDIS Take 1 tablet (40 mg total) by mouth daily.           Objective:    Physical Exam BP (!) 127/58 (BP Location: Left Arm, Patient Position: Sitting, Cuff Size: Small)   Pulse (!) 49   Temp 98.1 F (36.7 C) (Oral)   Resp 16   Ht 5\' 9"  (1.753 m)   Wt 200 lb 8 oz (90.9 kg)   SpO2 97%   BMI 29.61 kg/m  General: Well developed, NAD, BMI noted Neck: No  thyromegaly  HEENT:  Normocephalic . Face symmetric, atraumatic Lungs:  CTA B Normal respiratory effort, no intercostal retractions, no accessory muscle use. Heart: Bradycardic,  no murmur.  No pretibial edema bilaterally  Abdomen:  Not distended, soft, non-tender. No rebound or rigidity.   Skin: Exposed areas without rash. Not pale. Not jaundice DRE: Normal sphincter tone, brown stools, prostate with medium enlargement, not tender or nodular Neurologic:  alert & oriented X3.  Speech normal, gait appropriate for age and unassisted Strength symmetric and appropriate for age.  Psych: Cognition and judgment appear intact.  Cooperative with normal attention span and concentration.  Behavior appropriate. No anxious or depressed appearing.     Assessment    Assessment   HTN- on Cardizem for a while, added losartan 10-2015 Hyperlipidemia ED DJD- s/p knee injections (flexogenic) ~ 2016; I signed a parking permit OSA : not using  CPAP as off 10-2017 Glaucoma Abdominal wall mass see physical exam 05-25-2016 H/o Thrombocytopenia, saw hematology, rx  observation GU: --H/o LUTS- Nocturia  Saw  Urologist ~2012 underwent full workup >> dx BPH and detrusor instability, failed several medications --Re-eval by urology:12-2016 Recurrence cystitis, chronic prostatitis  PLAN: Here for CPX HTN: Well-controlled, bradycardic but asymptomatic, continue Cartia and Micardis Hyperlipidemia: Diet controlled, check FLP Prostatitis: Completely recuperated, check a PSA. RTC 1 year

## 2019-06-21 NOTE — Assessment & Plan Note (Signed)
Here for CPX HTN: Well-controlled, bradycardic but asymptomatic, continue Cartia and Micardis Hyperlipidemia: Diet controlled, check FLP Prostatitis: Completely recuperated, check a PSA. RTC 1 year

## 2019-10-03 ENCOUNTER — Other Ambulatory Visit: Payer: Self-pay | Admitting: Internal Medicine

## 2020-01-18 ENCOUNTER — Other Ambulatory Visit: Payer: Self-pay | Admitting: Internal Medicine

## 2020-01-19 DIAGNOSIS — H401133 Primary open-angle glaucoma, bilateral, severe stage: Secondary | ICD-10-CM | POA: Diagnosis not present

## 2020-01-19 DIAGNOSIS — H04123 Dry eye syndrome of bilateral lacrimal glands: Secondary | ICD-10-CM | POA: Diagnosis not present

## 2020-01-19 DIAGNOSIS — H524 Presbyopia: Secondary | ICD-10-CM | POA: Diagnosis not present

## 2020-01-19 DIAGNOSIS — H2513 Age-related nuclear cataract, bilateral: Secondary | ICD-10-CM | POA: Diagnosis not present

## 2020-02-16 DIAGNOSIS — H401133 Primary open-angle glaucoma, bilateral, severe stage: Secondary | ICD-10-CM | POA: Diagnosis not present

## 2020-02-16 DIAGNOSIS — H2513 Age-related nuclear cataract, bilateral: Secondary | ICD-10-CM | POA: Diagnosis not present

## 2020-02-20 DIAGNOSIS — Z20822 Contact with and (suspected) exposure to covid-19: Secondary | ICD-10-CM | POA: Diagnosis not present

## 2020-02-20 DIAGNOSIS — H401133 Primary open-angle glaucoma, bilateral, severe stage: Secondary | ICD-10-CM | POA: Diagnosis not present

## 2020-02-22 DIAGNOSIS — Z79899 Other long term (current) drug therapy: Secondary | ICD-10-CM | POA: Diagnosis not present

## 2020-02-22 DIAGNOSIS — H25011 Cortical age-related cataract, right eye: Secondary | ICD-10-CM | POA: Diagnosis not present

## 2020-02-22 DIAGNOSIS — Z9889 Other specified postprocedural states: Secondary | ICD-10-CM | POA: Diagnosis not present

## 2020-02-22 DIAGNOSIS — H401113 Primary open-angle glaucoma, right eye, severe stage: Secondary | ICD-10-CM | POA: Diagnosis not present

## 2020-02-22 DIAGNOSIS — E785 Hyperlipidemia, unspecified: Secondary | ICD-10-CM | POA: Diagnosis not present

## 2020-02-22 DIAGNOSIS — G4730 Sleep apnea, unspecified: Secondary | ICD-10-CM | POA: Diagnosis not present

## 2020-02-22 DIAGNOSIS — M170 Bilateral primary osteoarthritis of knee: Secondary | ICD-10-CM | POA: Diagnosis not present

## 2020-02-22 DIAGNOSIS — Z87891 Personal history of nicotine dependence: Secondary | ICD-10-CM | POA: Diagnosis not present

## 2020-02-22 DIAGNOSIS — H2513 Age-related nuclear cataract, bilateral: Secondary | ICD-10-CM | POA: Diagnosis not present

## 2020-02-22 DIAGNOSIS — I10 Essential (primary) hypertension: Secondary | ICD-10-CM | POA: Diagnosis not present

## 2020-02-22 HISTORY — PX: EYE SURGERY: SHX253

## 2020-03-03 ENCOUNTER — Other Ambulatory Visit: Payer: Self-pay

## 2020-03-03 ENCOUNTER — Ambulatory Visit (INDEPENDENT_AMBULATORY_CARE_PROVIDER_SITE_OTHER): Payer: Medicare PPO | Admitting: Internal Medicine

## 2020-03-03 VITALS — BP 141/74 | HR 75 | Temp 97.3°F

## 2020-03-03 DIAGNOSIS — R300 Dysuria: Secondary | ICD-10-CM | POA: Diagnosis not present

## 2020-03-03 DIAGNOSIS — R399 Unspecified symptoms and signs involving the genitourinary system: Secondary | ICD-10-CM

## 2020-03-03 DIAGNOSIS — R350 Frequency of micturition: Secondary | ICD-10-CM

## 2020-03-03 DIAGNOSIS — N390 Urinary tract infection, site not specified: Secondary | ICD-10-CM

## 2020-03-03 LAB — POC URINALSYSI DIPSTICK (AUTOMATED)
Bilirubin, UA: NEGATIVE
Blood, UA: NEGATIVE
Glucose, UA: NEGATIVE
Ketones, UA: NEGATIVE
Nitrite, UA: POSITIVE
Protein, UA: NEGATIVE
Spec Grav, UA: 1.015 (ref 1.010–1.025)
Urobilinogen, UA: 1 E.U./dL
pH, UA: 6 (ref 5.0–8.0)

## 2020-03-03 LAB — PSA: PSA: 1.79 ng/mL (ref 0.10–4.00)

## 2020-03-03 NOTE — Progress Notes (Signed)
Subjective:    Patient ID: Darren Barnes, male    DOB: 1941/05/11, 79 y.o.   MRN: [id number]  DOS:  03/03/2020 Type of visit - description: Virtual Visit via Telephone  Attempted  to make this a video visit, due to technical difficulties from the patient side it was not possible  thus we proceeded with a Virtual Visit via Telephone    I connected with above mentioned patient  by telephone and verified that I am speaking with the correct person using two identifiers.  THIS ENCOUNTER IS A VIRTUAL VISIT DUE TO COVID-19 - PATIENT WAS NOT SEEN IN THE OFFICE. PATIENT HAS CONSENTED TO VIRTUAL VISIT / TELEMEDICINE VISIT   Location of patient: home  Location of provider: office  I discussed the limitations, risks, security and privacy concerns of performing an evaluation and management service by telephone and the availability of in person appointments. I also discussed with the patient that there may be a patient responsible charge related to this service. The patient expressed understanding and agreed to proceed.   Acute Symptoms started a month ago: Urinary frequency, every 45 minutes. Also an unusual smell in the urine. He recently has been dealing with eye problems so he could not call sooner.     Review of Systems No fever chills No abdominal or flank pain No nausea or vomiting + Urgency but no dysuria or gross hematuria  Past Medical History:  Diagnosis Date  . ED (erectile dysfunction)   . Glaucoma   . Hx of adenomatous colonic polyps 07/30/2017  . Hyperlipidemia   . Hypertension   . Nocturia    saw urology 2012, was eval, rx vesicare (after several other meds failed)  . OSA on CPAP   . Osteoarthritis   . Sleep apnea   . Thrombocytopenia ([place])    saw hematology, observe    Past Surgical History:  Procedure Laterality Date  . anal polyp resection  2008   Dr Debbora Presto  . Benign tumor from Left arm    . KNEE ARTHROSCOPY  2004  . Sinus surgery for benign tumor       Allergies as of 03/03/2020   No Known Allergies     Medication List       Accurate as of March 03, 2020  8:09 AM. If you have any questions, ask your nurse or doctor.        Alphagan P 0.1 % Soln Generic drug: brimonidine Apply to eye.   diltiazem 240 MG 24 hr capsule Commonly known as: CARDIZEM CD Take 1 capsule (240 mg total) by mouth 2 (two) times daily.   dorzolamide-timolol 22.3-6.8 MG/ML ophthalmic solution Commonly known as: COSOPT Place 1 drop into both eyes 2 (two) times daily.   Lumigan 0.01 % Soln Generic drug: bimatoprost Place 1 drop into both eyes every evening.   multivitamin per tablet Take 1 tablet by mouth daily.   prednisoLONE acetate 1 % ophthalmic suspension Commonly known as: PRED FORTE Apply to eye.   Rocklatan 0.02-0.005 % Soln Generic drug: Netarsudil-Latanoprost Apply to eye.   tamsulosin 0.4 MG Caps capsule Commonly known as: FLOMAX Take 1 capsule (0.4 mg total) by mouth 2 (two) times a day.   telmisartan 40 MG tablet Commonly known as: MICARDIS Take 1 tablet (40 mg total) by mouth daily.   trimethoprim-polymyxin b ophthalmic solution Commonly known as: POLYTRIM          Objective:   Physical Exam BP (!) 141/74   Pulse 75  This is a telephone virtual visit, he is alert oriented x3, in no distress.    Assessment   Assessment   HTN- on Cardizem for a while, added losartan 10-2015 Hyperlipidemia ED DJD- s/p knee injections (flexogenic) ~ 2016; I signed a parking permit OSA : not using  CPAP as off 10-2017 Glaucoma Abdominal wall mass see physical exam 05-25-2016 H/o Thrombocytopenia, saw hematology, rx  observation GU: --H/o LUTS- Nocturia  Saw  Urologist ~2012 underwent full workup >> dx BPH and detrusor instability, failed several medications --Re-eval by urology:12-2016 Recurrence cystitis, chronic prostatitis  PLAN: UTI versus prostatitis: Symptoms started a month ago, history of recurrent cystitis and  prostatitis per chart reviewed We will get a UA, urine culture and PSA today.       I discussed the assessment and treatment plan with the patient. The patient was provided an opportunity to ask questions and all were answered. The patient agreed with the plan and demonstrated an understanding of the instructions.   The patient was advised to call back or seek an in-person evaluation if the symptoms worsen or if the condition fails to improve as anticipated.  I provided 18 minutes of non-face-to-face time during this encounter.  Kathlene November, MD

## 2020-03-04 NOTE — Assessment & Plan Note (Signed)
UTI versus prostatitis: Symptoms started a month ago, history of recurrent cystitis and prostatitis per chart reviewed We will get a UA, urine culture and PSA today.

## 2020-03-05 LAB — URINE CULTURE
MICRO NUMBER:: 10240554
SPECIMEN QUALITY:: ADEQUATE

## 2020-03-06 ENCOUNTER — Other Ambulatory Visit: Payer: Self-pay | Admitting: Internal Medicine

## 2020-03-06 MED ORDER — SULFAMETHOXAZOLE-TRIMETHOPRIM 800-160 MG PO TABS: 1.0000 | ORAL_TABLET | Freq: Two times a day (BID) | ORAL | 0 refills | Status: DC

## 2020-03-07 NOTE — Addendum Note (Signed)
Addended byDamita Dunnings D on: 03/07/2020 08:16 AM   Modules accepted: Orders

## 2020-04-11 ENCOUNTER — Other Ambulatory Visit: Payer: Self-pay

## 2020-04-11 ENCOUNTER — Other Ambulatory Visit (INDEPENDENT_AMBULATORY_CARE_PROVIDER_SITE_OTHER): Payer: Medicare PPO

## 2020-04-11 DIAGNOSIS — N390 Urinary tract infection, site not specified: Secondary | ICD-10-CM

## 2020-04-11 LAB — URINALYSIS, ROUTINE W REFLEX MICROSCOPIC
Bilirubin Urine: NEGATIVE
Hgb urine dipstick: NEGATIVE
Ketones, ur: NEGATIVE
Nitrite: POSITIVE — AB
Specific Gravity, Urine: 1.015 (ref 1.000–1.030)
Total Protein, Urine: NEGATIVE
Urine Glucose: NEGATIVE
Urobilinogen, UA: 0.2 (ref 0.0–1.0)
pH: 6 (ref 5.0–8.0)

## 2020-04-13 LAB — URINE CULTURE
MICRO NUMBER:: 10378811
SPECIMEN QUALITY:: ADEQUATE

## 2020-04-15 NOTE — Addendum Note (Signed)
Addended byDamita Dunnings D on: 04/15/2020 12:51 PM   Modules accepted: Orders

## 2020-04-19 ENCOUNTER — Encounter: Payer: Self-pay | Admitting: Internal Medicine

## 2020-04-29 ENCOUNTER — Encounter: Payer: Self-pay | Admitting: Internal Medicine

## 2020-05-10 DIAGNOSIS — R3914 Feeling of incomplete bladder emptying: Secondary | ICD-10-CM | POA: Diagnosis not present

## 2020-05-10 DIAGNOSIS — N3020 Other chronic cystitis without hematuria: Secondary | ICD-10-CM | POA: Diagnosis not present

## 2020-05-11 ENCOUNTER — Other Ambulatory Visit: Payer: Self-pay

## 2020-05-11 MED ORDER — TAMSULOSIN HCL 0.4 MG PO CAPS: 0.4000 mg | ORAL_CAPSULE | Freq: Two times a day (BID) | ORAL | 3 refills | Status: DC

## 2020-05-18 ENCOUNTER — Encounter: Payer: Self-pay | Admitting: Internal Medicine

## 2020-05-19 ENCOUNTER — Telehealth: Payer: Self-pay | Admitting: Internal Medicine

## 2020-05-19 ENCOUNTER — Encounter: Payer: Self-pay | Admitting: Internal Medicine

## 2020-05-19 NOTE — Telephone Encounter (Signed)
Please call the patient, I saw his message.  He has ongoing constipation, as long as he does not have abdominal pain, nausea, vomiting, fever or chills he may like to try: A glycerin suppository or Fleet enema OTC.  If he feels he needs to be seen please try to schedule him with one of our doctors, I believe my schedule is full; otherwise he could go to the urgent care.

## 2020-05-19 NOTE — Telephone Encounter (Signed)
Spoke w/ Pt- informed of PCP recommendations. Pt verbalized understanding.  

## 2020-05-23 ENCOUNTER — Other Ambulatory Visit: Payer: Self-pay

## 2020-05-23 ENCOUNTER — Emergency Department (HOSPITAL_COMMUNITY): Payer: Medicare PPO

## 2020-05-23 ENCOUNTER — Encounter (HOSPITAL_COMMUNITY): Payer: Self-pay

## 2020-05-23 ENCOUNTER — Emergency Department (HOSPITAL_COMMUNITY)
Admission: EM | Admit: 2020-05-23 | Discharge: 2020-05-23 | Disposition: A | Discharge status: Home / Self Care | Payer: Medicare PPO | Attending: Emergency Medicine | Admitting: Emergency Medicine

## 2020-05-23 DIAGNOSIS — I10 Essential (primary) hypertension: Secondary | ICD-10-CM | POA: Diagnosis not present

## 2020-05-23 DIAGNOSIS — Z79899 Other long term (current) drug therapy: Secondary | ICD-10-CM | POA: Insufficient documentation

## 2020-05-23 DIAGNOSIS — K5900 Constipation, unspecified: Secondary | ICD-10-CM | POA: Diagnosis not present

## 2020-05-23 MED ORDER — MAGNESIUM CITRATE PO SOLN: ORAL | 1 refills | Status: DC

## 2020-05-23 MED ORDER — POLYETHYLENE GLYCOL 3350 17 GM/SCOOP PO POWD: ORAL | 0 refills | Status: DC

## 2020-05-23 NOTE — ED Provider Notes (Signed)
[facility] EMERGENCY DEPARTMENT Provider Note   CSN: [id number] Arrival date & time: 05/23/20  1125     History No chief complaint on file.   Darren Barnes is a 79 y.o. male.  Pt complains of constipation for the past 2 weeks.  Pt reports his Md had him start taking miralax with no results.  Pt took MOM on Thursday and had a bowel movement.  Pt used a fleets enema yesterday with no results.  Pt called his MD who sent him here to make sure he does not have a blockage.    The history is provided by the patient. A language interpreter was used.  Constipation Severity:  Moderate Time since last bowel movement:  2 weeks Timing:  Constant Progression:  Worsening Chronicity:  New Context: not dehydration   Stool description:  None produced Relieved by:  Nothing Ineffective treatments:  None tried Associated symptoms: no fever   Risk factors: no recent illness        Past Medical History:  Diagnosis Date  . ED (erectile dysfunction)   . Glaucoma   . Hx of adenomatous colonic polyps 07/30/2017  . Hyperlipidemia   . Hypertension   . Nocturia    saw urology 2012, was eval, rx vesicare (after several other meds failed)  . OSA on CPAP   . Osteoarthritis   . Sleep apnea   . Thrombocytopenia ([place])    saw hematology, observe    Patient Active Problem List   Diagnosis Date Noted  . Hx of adenomatous colonic polyps 07/30/2017  . PCP NOTES >>> 10/26/2015  . Rhinitis 08/09/2014  . OSA (obstructive sleep apnea) 11/13/2012  . Hemorrhoids 10/27/2012  . Annual physical exam 05/15/2011  . LUTS-Nocturia 05/03/2009  . Osteoarthritis 10/06/2008  . Thrombocytopenia ([place]) 09/12/2007  . Dyslipidemia 05/01/2007  . ERECTILE DYSFUNCTION 05/01/2007  . Essential hypertension 05/01/2007  . DIVERTICULOSIS, COLON 05/01/2007    Past Surgical History:  Procedure Laterality Date  . anal polyp resection  2008   Dr Debbora Presto  . Benign tumor from Left arm    . KNEE ARTHROSCOPY   2004  . Sinus surgery for benign tumor         Family History  Problem Relation Age of Onset  . Coronary artery disease Father 48       MI at 28  . Hypertension Mother        Jerilynn Mages and F   . Diabetes Mother   . Stroke Mother 28  . Colon cancer Neg Hx   . Prostate cancer Neg Hx   . Esophageal cancer Neg Hx   . Pancreatic cancer Neg Hx   . Rectal cancer Neg Hx   . Stomach cancer Neg Hx     Social History   Tobacco Use  . Smoking status: Never Smoker  . Smokeless tobacco: Never Used  Substance Use Topics  . Alcohol use: Yes    Comment: 2 per day on average  . Drug use: No    Home Medications Prior to Admission medications   Medication Sig Start Date End Date Taking? Authorizing Provider  brimonidine (ALPHAGAN P) 0.1 % SOLN Apply to eye. 02/22/20   [provider]  diltiazem (CARDIZEM CD) 240 MG 24 hr capsule Take 1 capsule (240 mg total) by mouth 2 (two) times daily. 01/18/20   Colon Branch, MD  dorzolamide-timolol (COSOPT) 22.3-6.8 MG/ML ophthalmic solution Place 1 drop into both eyes 2 (two) times daily.    [provider]  LUMIGAN 0.01 % SOLN Place 1 drop into both eyes every evening. 12/14/15   [provider]  multivitamin [facility]) per tablet Take 1 tablet by mouth daily.      [provider]  Netarsudil-Latanoprost (ROCKLATAN) 0.02-0.005 % SOLN Apply to eye. 02/22/20   [provider]  prednisoLONE acetate (PRED FORTE) 1 % ophthalmic suspension Apply to eye. 03/01/20   [provider]  sulfamethoxazole-trimethoprim (BACTRIM DS) 800-160 MG tablet Take 1 tablet by mouth 2 (two) times daily. 03/06/20   Colon Branch, MD  tamsulosin (FLOMAX) 0.4 MG CAPS capsule Take 1 capsule (0.4 mg total) by mouth in the morning and at bedtime. 05/11/20   Colon Branch, MD  telmisartan (MICARDIS) 40 MG tablet Take 1 tablet (40 mg total) by mouth daily. 10/05/19   Colon Branch, MD  trimethoprim-polymyxin b Mayra Neer) ophthalmic solution  02/22/20    [provider]    Allergies    Patient has no known allergies.  Review of Systems   Review of Systems  Constitutional: Negative for fever.  Gastrointestinal: Positive for constipation.  All other systems reviewed and are negative.   Physical Exam Updated Vital Signs BP (!) 175/72 (BP Location: Right Arm)   Pulse 77   Temp 98.1 F (36.7 C) (Oral)   Resp 18   Ht 5\' 9"  (1.753 m)   Wt 90.7 kg   SpO2 98%   BMI 29.53 kg/m   Physical Exam Vitals and nursing note reviewed.  Constitutional:      Appearance: He is well-developed.  HENT:     Head: Normocephalic and atraumatic.  Eyes:     Conjunctiva/sclera: Conjunctivae normal.  Cardiovascular:     Rate and Rhythm: Normal rate and regular rhythm.     Heart sounds: No murmur.  Pulmonary:     Effort: Pulmonary effort is normal. No respiratory distress.     Breath sounds: Normal breath sounds.  Abdominal:     Palpations: Abdomen is soft.  Genitourinary:    Rectum: Normal.     Comments: No impaction  No stool in vault  Musculoskeletal:        General: Normal range of motion.     Cervical back: Neck supple.  Skin:    General: Skin is warm and dry.  Neurological:     General: No focal deficit present.     Mental Status: He is alert.  Psychiatric:        Mood and Affect: Mood normal.     ED Results / Procedures / Treatments   Labs (all labs ordered are listed, but only abnormal results are displayed) Labs Reviewed - No data to display  EKG None  Radiology DG Abdomen 1 View  Result Date: 05/23/2020 CLINICAL DATA:  Bloating and constipation. EXAM: ABDOMEN - 1 VIEW COMPARISON:  None. FINDINGS: The bowel gas pattern is normal. No radio-opaque calculi or other significant radiographic abnormality are seen. IMPRESSION: Negative. Electronically Signed   By: Dorise Bullion III M.D   On: 05/23/2020 15:52    Procedures Procedures (including critical care time)  Medications Ordered in ED Medications - No data  to display  ED Course  I have reviewed the triage vital signs and the nursing notes.  Pertinent labs & imaging results that were available during my care of the patient were reviewed by me and considered in my medical decision making (see chart for details).    MDM Rules/Calculators/A&P  MDM:  KUB  No impaction.  Diffuse stool  Final Clinical Impression(s) / ED Diagnoses Final diagnoses:  Constipation, unspecified constipation type    Rx / DC Orders ED Discharge Orders         Ordered    magnesium citrate SOLN     05/23/20 1641    polyethylene glycol powder (MIRALAX) 17 GM/SCOOP powder     05/23/20 1641        An After Visit Summary was printed and given to the patient.    Fransico Meadow, [place] 05/23/20 [place], Ankit, MD 05/30/20 [phone]

## 2020-05-23 NOTE — Discharge Instructions (Addendum)
Drink 1 bottle of mag citrate.  Repeat 1 bottle in 12 hours

## 2020-05-23 NOTE — ED Triage Notes (Signed)
Patient complains of constipation since Friday after using laxative. Denies abdominal pain. Alert and oriented, NAD

## 2020-05-23 NOTE — ED Notes (Signed)
Pt st's he took a laxative on Fri and had good results on Sat.  Pt st's he took another laxative on Sun night and used a enema this am with no results.  Pt denies any abd pain

## 2020-05-26 ENCOUNTER — Encounter: Payer: Self-pay | Admitting: Internal Medicine

## 2020-05-27 ENCOUNTER — Encounter: Payer: Self-pay | Admitting: Internal Medicine

## 2020-05-27 ENCOUNTER — Ambulatory Visit: Payer: Medicare PPO | Admitting: Internal Medicine

## 2020-05-27 ENCOUNTER — Other Ambulatory Visit: Payer: Self-pay

## 2020-05-27 VITALS — BP 141/57 | HR 63 | Temp 97.9°F | Resp 16 | Ht 69.0 in | Wt 194.0 lb

## 2020-05-27 DIAGNOSIS — K5900 Constipation, unspecified: Secondary | ICD-10-CM

## 2020-05-27 NOTE — Progress Notes (Signed)
Pre visit review using our clinic review tool, if applicable. No additional management support is needed unless otherwise documented below in the visit note. 

## 2020-05-27 NOTE — Patient Instructions (Signed)
Stay well-hydrated with water  Eat a fiber rich diet including fruits, vegetables.  Start Metamucil 1 capsule daily with your breakfast  Continue MiraLAX daily  Bisacodyl 5 to 15  mg OTC daily for 1 week, then as needed  Attempt at [facility] after a meal to take advantage of the gastrocolic reflex  Okay to use a Fleet enema or glycerin suppository if needed  ER if severe symptoms, fever, chills, nausea or vomiting.  If you are not gradually better let me know, will refer you to the gastroenterologist

## 2020-05-27 NOTE — Progress Notes (Signed)
Subjective:    Patient ID: Darren Barnes, male    DOB: 09/08/1941, 79 y.o.   MRN: [id number]  DOS:  05/27/2020 Type of visit - description: Acute visit, here with his wife He used to have regular bowel movements, typically once daily, stools were  normal without blood.  For the last 3 to 4 weeks, he become constipated. He is not having bowel movements unless he takes a medication. Initially tried milk of magnesia with little success Subsequently MiraLAX for few days without much success. Went to the ER 05/23/2020, x-ray of the abdomen showed normal gas pattern. They recommend to continue MiraLAX and try magnesium citrate which he did x2. He did have a bowel movement after Mg Citrate but then again he continue with constipation unless he takes something.  Review of Systems No fever chills No nausea or vomiting but he seems somewhat bloated No change in diet No new medications except taking cephalexin as prescribed by urology   Past Medical History:  Diagnosis Date   ED (erectile dysfunction)    Glaucoma    Hx of adenomatous colonic polyps 07/30/2017   Hyperlipidemia    Hypertension    Nocturia    saw urology 2012, was eval, rx vesicare (after several other meds failed)   OSA on CPAP    Osteoarthritis    Sleep apnea    Thrombocytopenia ([place])    saw hematology, observe    Past Surgical History:  Procedure Laterality Date   anal polyp resection  2008   Dr Debbora Presto   Benign tumor from Left arm     KNEE ARTHROSCOPY  2004   Sinus surgery for benign tumor      Allergies as of 05/27/2020   No Known Allergies     Medication List       Accurate as of May 27, 2020  3:40 PM. If you have any questions, ask your nurse or doctor.        Alphagan P 0.1 % Soln Generic drug: brimonidine Apply to eye.   diltiazem 240 MG 24 hr capsule Commonly known as: CARDIZEM CD Take 1 capsule (240 mg total) by mouth 2 (two) times daily.   dorzolamide-timolol 22.3-6.8 MG/ML  ophthalmic solution Commonly known as: COSOPT Place 1 drop into both eyes 2 (two) times daily.   Lumigan 0.01 % Soln Generic drug: bimatoprost Place 1 drop into both eyes every evening.   magnesium citrate Soln One bottle now then repeat in 12 hours if not improved   multivitamin per tablet Take 1 tablet by mouth daily.   polyethylene glycol powder 17 GM/SCOOP powder Commonly known as: MiraLax 2 scoops today and then 1 a day   prednisoLONE acetate 1 % ophthalmic suspension Commonly known as: PRED FORTE Apply to eye.   Rocklatan 0.02-0.005 % Soln Generic drug: Netarsudil-Latanoprost Apply to eye.   sulfamethoxazole-trimethoprim 800-160 MG tablet Commonly known as: BACTRIM DS Take 1 tablet by mouth 2 (two) times daily.   tamsulosin 0.4 MG Caps capsule Commonly known as: FLOMAX Take 1 capsule (0.4 mg total) by mouth in the morning and at bedtime.   telmisartan 40 MG tablet Commonly known as: MICARDIS Take 1 tablet (40 mg total) by mouth daily.   trimethoprim-polymyxin b ophthalmic solution Commonly known as: POLYTRIM          Objective:   Physical Exam BP (!) 141/57 (BP Location: Left Arm, Patient Position: Sitting, Cuff Size: Normal)    Pulse 63    Temp 97.9 F (  36.6 C) (Temporal)    Resp 16    Ht 5\' 9"  (1.753 m)    Wt 194 lb (88 kg)    SpO2 97%    BMI 28.65 kg/m  General:   Well developed, NAD, BMI noted.  HEENT:  Normocephalic . Face symmetric, atraumatic.  Not pale Lungs:  CTA B Normal respiratory effort, no intercostal retractions, no accessory muscle use. Heart: RRR,  no murmur.  Abdomen:  Not distended, soft, non-tender. No rebound or rigidity.  Slightly increased bowel sounds. Skin: Not pale. Not jaundice Lower extremities: no pretibial edema bilaterally DRE: Normal sphincter tone, rectum empty, no impaction, prostate normal. Neurologic:  alert & oriented X3.  Speech normal, gait appropriate for age and unassisted Psych--  Cognition and judgment  appear intact.  Cooperative with normal attention span and concentration.  Behavior appropriate. No anxious or depressed appearing.     Assessment     Assessment   HTN- on Cardizem for a while, added losartan 10-2015 Hyperlipidemia ED DJD- s/p knee injections (flexogenic) ~ 2016; I signed a parking permit OSA : not using  CPAP as off 10-2017 Glaucoma Abdominal wall mass see physical exam 05-25-2016 H/o Thrombocytopenia, saw hematology, rx  observation GU: --H/o LUTS- Nocturia  Saw  Urologist ~2012 underwent full workup >> dx BPH and detrusor instability, failed several medications --Re-eval by urology:12-2016 Recurrence cystitis, chronic prostatitis  PLAN: Constipation: Constipation for 3 to 4 weeks, has tried MOM, MiraLAX, magnesium citrate which worked. Physical exam is benign.  No new medicines, on CCB's chronically.  No change in diet.  Recent x-ray at the ER with no obstruction. Had a colonoscopy 07-2017: Diverticulosis, hypertrophied papilla, polyps. Plan: Hydration, high-fiber intake, MiraLAX, bisacodyl for 1 week, Fleet Enema/glycerin suppository if needed (pt would prefer magnesium citrate which worked before, okay but not daily), ER if severe symptoms, see AVS. If not better will need GI referral Labs today: CMP, CBC, TSH  This visit occurred during the SARS-CoV-2 public health emergency.  Safety protocols were in place, including screening questions prior to the visit, additional usage of staff PPE, and extensive cleaning of exam room while observing appropriate contact time as indicated for disinfecting solutions.

## 2020-05-28 LAB — CBC WITH DIFFERENTIAL/PLATELET
Absolute Monocytes: 758 cells/uL (ref 200–950)
Basophils Absolute: 32 cells/uL (ref 0–200)
Basophils Relative: 0.6 %
Eosinophils Absolute: 292 cells/uL (ref 15–500)
Eosinophils Relative: 5.5 %
HCT: 41.8 % (ref 38.5–50.0)
Hemoglobin: 14.2 g/dL (ref 13.2–17.1)
Lymphs Abs: 1405 cells/uL (ref 850–3900)
MCH: 29.8 pg (ref 27.0–33.0)
MCHC: 34 g/dL (ref 32.0–36.0)
MCV: 87.6 fL (ref 80.0–100.0)
MPV: 13.8 fL — ABNORMAL HIGH (ref 7.5–12.5)
Monocytes Relative: 14.3 %
Neutro Abs: 2814 cells/uL (ref 1500–7800)
Neutrophils Relative %: 53.1 %
Platelets: 114 10*3/uL — ABNORMAL LOW (ref 140–400)
RBC: 4.77 10*6/uL (ref 4.20–5.80)
RDW: 11.7 % (ref 11.0–15.0)
Total Lymphocyte: 26.5 %
WBC: 5.3 10*3/uL (ref 3.8–10.8)

## 2020-05-28 LAB — BASIC METABOLIC PANEL
BUN/Creatinine Ratio: 13 (calc) (ref 6–22)
BUN: 16 mg/dL (ref 7–25)
CO2: 24 mmol/L (ref 20–32)
Calcium: 9 mg/dL (ref 8.6–10.3)
Chloride: 103 mmol/L (ref 98–110)
Creat: 1.19 mg/dL — ABNORMAL HIGH (ref 0.70–1.11)
Glucose, Bld: 103 mg/dL — ABNORMAL HIGH (ref 65–99)
Potassium: 4.1 mmol/L (ref 3.5–5.3)
Sodium: 136 mmol/L (ref 135–146)

## 2020-05-28 LAB — TSH: TSH: 1.74 mIU/L (ref 0.40–4.50)

## 2020-05-29 NOTE — Assessment & Plan Note (Signed)
Constipation: Constipation for 3 to 4 weeks, has tried MOM, MiraLAX, magnesium citrate which worked. Physical exam is benign.  No new medicines, on CCB's chronically.  No change in diet.  Recent x-ray at the ER with no obstruction. Had a colonoscopy 07-2017: Diverticulosis, hypertrophied papilla, polyps. Plan: Hydration, high-fiber intake, MiraLAX, bisacodyl for 1 week, Fleet Enema/glycerin suppository if needed (pt would prefer magnesium citrate which worked before, okay but not daily), ER if severe symptoms, see AVS. If not better will need GI referral Labs today: CMP, CBC, TSH

## 2020-05-31 ENCOUNTER — Encounter: Payer: Self-pay | Admitting: Internal Medicine

## 2020-05-31 DIAGNOSIS — K5900 Constipation, unspecified: Secondary | ICD-10-CM

## 2020-05-31 DIAGNOSIS — R194 Change in bowel habit: Secondary | ICD-10-CM

## 2020-06-07 DIAGNOSIS — H401133 Primary open-angle glaucoma, bilateral, severe stage: Secondary | ICD-10-CM | POA: Diagnosis not present

## 2020-06-09 ENCOUNTER — Encounter: Payer: Self-pay | Admitting: Internal Medicine

## 2020-06-21 ENCOUNTER — Ambulatory Visit (INDEPENDENT_AMBULATORY_CARE_PROVIDER_SITE_OTHER): Payer: Medicare PPO | Admitting: Internal Medicine

## 2020-06-21 ENCOUNTER — Encounter: Payer: Self-pay | Admitting: Internal Medicine

## 2020-06-21 ENCOUNTER — Other Ambulatory Visit: Payer: Self-pay

## 2020-06-21 VITALS — BP 113/68 | HR 54 | Temp 96.6°F | Resp 18 | Ht 69.0 in | Wt 191.5 lb

## 2020-06-21 DIAGNOSIS — Z0000 Encounter for general adult medical examination without abnormal findings: Secondary | ICD-10-CM | POA: Diagnosis not present

## 2020-06-21 DIAGNOSIS — I10 Essential (primary) hypertension: Secondary | ICD-10-CM | POA: Diagnosis not present

## 2020-06-21 DIAGNOSIS — E785 Hyperlipidemia, unspecified: Secondary | ICD-10-CM

## 2020-06-21 LAB — LIPID PANEL
Cholesterol: 172 mg/dL (ref 0–200)
HDL: 45.4 mg/dL (ref >39.00)
LDL Cholesterol: 113 mg/dL — ABNORMAL HIGH (ref 0–99)
NonHDL: 126.82
Total CHOL/HDL Ratio: 4
Triglycerides: 67 mg/dL (ref 0.0–149.0)
VLDL: 13.4 mg/dL (ref 0.0–40.0)

## 2020-06-21 LAB — AST: AST: 13 U/L (ref 0–37)

## 2020-06-21 LAB — ALT: ALT: 18 U/L (ref 0–53)

## 2020-06-21 NOTE — Progress Notes (Signed)
Pre visit review using our clinic review tool, if applicable. No additional management support is needed unless otherwise documented below in the visit note. 

## 2020-06-21 NOTE — Progress Notes (Signed)
Subjective:    Patient ID: Darren Barnes, male    DOB: 1941-07-25, 79 y.o.   MRN: [id number]  DOS:  06/21/2020 Type of visit - description: CPX No new concerns. He continue with constipation, denies nausea, vomiting, diarrhea or blood in the stools.  Review of Systems  Other than above, a 14 point review of systems is negative    Past Medical History:  Diagnosis Date  . ED (erectile dysfunction)   . Glaucoma   . Hx of adenomatous colonic polyps 07/30/2017  . Hyperlipidemia   . Hypertension   . Nocturia    saw urology 2012, was eval, rx vesicare (after several other meds failed)  . Osteoarthritis   . Sleep apnea   . Thrombocytopenia ([place])    saw hematology, observe    Past Surgical History:  Procedure Laterality Date  . anal polyp resection  2008   Dr Debbora Presto  . Benign tumor from Left arm    . KNEE ARTHROSCOPY  2004  . Sinus surgery for benign tumor      Allergies as of 06/21/2020   No Known Allergies     Medication List       Accurate as of June 21, 2020 11:59 PM. If you have any questions, ask your nurse or doctor.        STOP taking these medications   magnesium citrate Soln Stopped by: Kathlene November, MD   prednisoLONE acetate 1 % ophthalmic suspension Commonly known as: PRED FORTE Stopped by: Kathlene November, MD   trimethoprim-polymyxin b ophthalmic solution Commonly known as: POLYTRIM Stopped by: Kathlene November, MD     TAKE these medications   Alphagan P 0.1 % Soln Generic drug: brimonidine Apply to eye.   diltiazem 240 MG 24 hr capsule Commonly known as: CARDIZEM CD Take 1 capsule (240 mg total) by mouth 2 (two) times daily.   dorzolamide-timolol 22.3-6.8 MG/ML ophthalmic solution Commonly known as: COSOPT Place 1 drop into both eyes 2 (two) times daily.   Lumigan 0.01 % Soln Generic drug: bimatoprost Place 1 drop into both eyes every evening.   multivitamin per tablet Take 1 tablet by mouth daily.   polyethylene glycol powder 17 GM/SCOOP  powder Commonly known as: MiraLax 2 scoops today and then 1 a day   Rocklatan 0.02-0.005 % Soln Generic drug: Netarsudil-Latanoprost Apply to eye.   tamsulosin 0.4 MG Caps capsule Commonly known as: FLOMAX Take 1 capsule (0.4 mg total) by mouth in the morning and at bedtime.   telmisartan 40 MG tablet Commonly known as: MICARDIS Take 1 tablet (40 mg total) by mouth daily.          Objective:   Physical Exam BP 113/68 (BP Location: Left Arm, Patient Position: Sitting, Cuff Size: Small)   Pulse (!) 54   Temp (!) 96.6 F (35.9 C) (Temporal)   Resp 18   Ht 5\' 9"  (1.753 m)   Wt 191 lb 8 oz (86.9 kg)   SpO2 98%   BMI 28.28 kg/m  General: Well developed, NAD, BMI noted Neck: No  thyromegaly  HEENT:  Normocephalic . Face symmetric, atraumatic Lungs:  CTA B Normal respiratory effort, no intercostal retractions, no accessory muscle use. Heart: RRR,  no murmur.  Abdomen:  Not distended, soft, non-tender. No rebound or rigidity.   Lower extremities: no pretibial edema bilaterally  Skin: Exposed areas without rash. Not pale. Not jaundice Neurologic:  alert & oriented X3.  Speech normal, gait appropriate for age and unassisted Strength symmetric  and appropriate for age.  Psych: Cognition and judgment appear intact.  Cooperative with normal attention span and concentration.  Behavior appropriate. No anxious or depressed appearing.     Assessment    Assessment   HTN- on Cardizem for a while, added losartan 10-2015 Hyperlipidemia ED DJD- s/p knee injections (flexogenic) ~ 2016; I signed a parking permit OSA : not using  CPAP as off 10-2017 Glaucoma Abdominal wall mass see physical exam 05-25-2016 H/o Thrombocytopenia, saw hematology, rx  observation GU: --H/o LUTS- Nocturia  Saw  Urologist ~2012 underwent full workup >> dx BPH and detrusor instability, failed several medications --Re-eval by urology:12-2016 Recurrence cystitis, chronic prostatitis  PLAN:  Here for  CPX HTN: On Cardizem and losartan, BP is very good.  EKG: Sinus bradycardia, asymptomatic, recent BMP satisfactory. Hyperlipidemia: Diet controlled, checking labs OSA: Intolerant to CPAP LUTS: Recently seen by urology Dr. Karsten Ro , dx UTI, RX abx Constipation: Has developed constipation for the last few weeks.  No other GI symptoms, doubt s/e from CCBs, on Cardizem for a while.  No anemia.  To see GI soon. RTC 1 year    This visit occurred during the SARS-CoV-2 public health emergency.  Safety protocols were in place, including screening questions prior to the visit, additional usage of staff PPE, and extensive cleaning of exam room while observing appropriate contact time as indicated for disinfecting solutions.

## 2020-06-21 NOTE — Patient Instructions (Addendum)
Please schedule Medicare Wellness with Glenard Haring.   Check the  blood pressure regularly BP GOAL is between 110/65 and  135/85. If it is consistently higher or lower, let me know   GO TO THE LAB : Get the blood work     [place], [place] back for a physical exam in 1 year

## 2020-06-22 ENCOUNTER — Encounter: Payer: Self-pay | Admitting: Internal Medicine

## 2020-06-22 NOTE — Assessment & Plan Note (Signed)
Here for CPX HTN: On Cardizem and losartan, BP is very good.  EKG: Sinus bradycardia, asymptomatic, recent BMP satisfactory. Hyperlipidemia: Diet controlled, checking labs OSA: Intolerant to CPAP LUTS: Recently seen by urology Dr. Karsten Ro , dx UTI, RX abx Constipation: Has developed constipation for the last few weeks.  No other GI symptoms, doubt s/e from CCBs, on Cardizem for a while.  No anemia.  To see GI soon. RTC 1 year

## 2020-06-22 NOTE — Assessment & Plan Note (Signed)
-  Td 2015 - PNM 23: 2005 and  2010;  prevnar 2015 - zostavax 2010 - s/p shingrex - s/p pfizer covid -rec flu shot q year  --CCS: Cscope 6-08  (Dr Carlean Purl) neg but had a polypoid anal lesion, lesion removed by Dr Bubba Camp; cscope again 07/2017 and per GI letter no recall due to age --prostate ca screening: last PSA wnl, recently saw urology w/ a UTI, now asx   --Height decreased -->   DEXA 05-2014 normal  --Diet exercise : counseled  --Labs:  FLP, AST ALT

## 2020-06-23 ENCOUNTER — Encounter: Payer: Self-pay | Admitting: Gastroenterology

## 2020-06-23 ENCOUNTER — Ambulatory Visit: Payer: Medicare PPO | Admitting: Gastroenterology

## 2020-06-23 VITALS — BP 112/62 | HR 66 | Ht 69.0 in | Wt 190.4 lb

## 2020-06-23 DIAGNOSIS — R194 Change in bowel habit: Secondary | ICD-10-CM

## 2020-06-23 DIAGNOSIS — K5900 Constipation, unspecified: Secondary | ICD-10-CM | POA: Diagnosis not present

## 2020-06-23 NOTE — Progress Notes (Signed)
06/23/2020 Darren Barnes [id number] 12-19-1941   HISTORY OF PRESENT ILLNESS: This is a pleasant 79 year old male is a patient of Dr. Celesta Aver.  His last colonoscopy was in August 2018 at which time he was found to have the following:  - Hypertrophied anal papilla(e) found on perianal exam. - Two 5 to 8 mm polyps in the transverse colon, removed with a cold snare. Resected and retrieved. - Two 2 mm polyps in the transverse colon, removed with a cold biopsy forceps. Resected and retrieved. - Diverticulosis in the sigmoid colon. - Anal papilla(e) were hypertrophied. - The examination was otherwise normal on direct and retroflexion views.  Polyps were tubular adenomas, but no recall was bleeding due to his age.    He is here today with his wife for complaints of change in bowel habits/constipation.  They say that he never had constipation in the past, used to have very regular bowel movements and then about 2 months ago he had a sudden change.  Has been taking MiraLAX every day as well as stool softener and then also added Metamucil fiber capsules as well but still needing to use 15 mg of Dulcolax stimulant laxative every 3 days.  This does work, but does not usually work for several hours.  He denies any rectal bleeding or abdominal pain.  TSH is normal.  He has not had any change in diet or activity, etc. that they can account for this.  They say that he has been using a lot of eyedrops, but I doubt that this would account for this since there is such small amounts that are absorbed systemically.   Past Medical History:  Diagnosis Date  . ED (erectile dysfunction)   . Glaucoma   . Hx of adenomatous colonic polyps 07/30/2017  . Hyperlipidemia   . Hypertension   . Nocturia    saw urology 2012, was eval, rx vesicare (after several other meds failed)  . Osteoarthritis   . Sleep apnea   . Thrombocytopenia ([place])    saw hematology, observe   Past Surgical History:  Procedure Laterality  Date  . anal polyp resection  2008   Dr Debbora Presto  . Benign tumor from Left arm    . EYE SURGERY  02/22/2020  . KNEE ARTHROSCOPY  2004  . Sinus surgery for benign tumor      reports that he has never smoked. He has never used smokeless tobacco. He reports current alcohol use. He reports that he does not use drugs. family history includes Coronary artery disease (age of onset: 22) in his father; Diabetes in his mother; Hypertension in his mother; Stroke (age of onset: 12) in his mother. No Known Allergies    Outpatient Encounter Medications as of 06/23/2020  Medication Sig  . bisacodyl (DULCOLAX) 5 MG EC tablet Take 15 mg by mouth every 3 (three) days.  . brimonidine (ALPHAGAN P) 0.1 % SOLN Apply to eye.  . cephALEXin (KEFLEX) 250 MG capsule Take 250 mg by mouth at bedtime.  Marland Kitchen diltiazem (CARDIZEM CD) 240 MG 24 hr capsule Take 1 capsule (240 mg total) by mouth 2 (two) times daily.  . dorzolamide-timolol (COSOPT) 22.3-6.8 MG/ML ophthalmic solution Place 1 drop into both eyes 2 (two) times daily.  Marland Kitchen LUMIGAN 0.01 % SOLN Place 1 drop into both eyes every evening.  . multivitamin (THERAGRAN) per tablet Take 1 tablet by mouth daily.    . Netarsudil-Latanoprost (ROCKLATAN) 0.02-0.005 % SOLN Apply to eye.  . polyethylene glycol powder (  MIRALAX) 17 GM/SCOOP powder 2 scoops today and then 1 a day  . tamsulosin (FLOMAX) 0.4 MG CAPS capsule Take 1 capsule (0.4 mg total) by mouth in the morning and at bedtime.  Marland Kitchen telmisartan (MICARDIS) 40 MG tablet Take 1 tablet (40 mg total) by mouth daily.   No facility-administered encounter medications on file as of 06/23/2020.     REVIEW OF SYSTEMS  : All other systems reviewed and negative except where noted in the History of Present Illness.   PHYSICAL EXAM: BP 112/62 (BP Location: Left Arm, Patient Position: Sitting, Cuff Size: Normal)   Pulse 66   Ht 5\' 9"  (1.753 m)   Wt 190 lb 6 oz (86.4 kg)   SpO2 97%   BMI 28.11 kg/m  General: Well developed AA male  in no acute distress Head: Normocephalic and atraumatic Eyes:  Sclerae anicteric, conjunctiva pink. Ears: Normal auditory acuity Lungs: Clear throughout to auscultation; no increased WOB. Heart: Regular rate and rhythm; no M/R/G. Abdomen: Soft, non-distended.  BS present.  Non-tender. Rectal:  Will be done at the time of colonoscopy. Musculoskeletal: Symmetrical with no gross deformities  Skin: No lesions on visible extremities Extremities: No edema  Neurological: Alert oriented x 4, grossly non-focal Psychological:  Alert and cooperative. Normal mood and affect  ASSESSMENT AND PLAN: *Change in bowel habits with constipation: They say that he never had issues with constipation in the past and all of a sudden starting 2 months ago he has been having issues and not even responding wonderfully to over-the-counter regimens that he has been taking.  They are concerned.  He had a colonoscopy 3 years ago and I advised him that repeat colonoscopy is likely going to be low yield for any type of physical/structural type of issue.  Advised that this is likely motility related.  There has been no change in diet or activity that they can account for this.  They would like colonoscopy for reassurance.  We will plan for colonoscopy with Dr. Carlean Purl.  In the interim I would like him to discontinue his Metamucil fiber capsules as they may be bulking his stool too much.  I would like him to increase his MiraLAX to twice daily.  If colonoscopy is unremarkable he does not respond to MiraLAX BID then  would consider Linzess.   CC:  Colon Branch, MD

## 2020-06-23 NOTE — Patient Instructions (Addendum)
If you are age 81 or older, your body mass index should be between 23-30. Your Body mass index is 28.11 kg/m. If this is out of the aforementioned range listed, please consider follow up with your Primary Care Provider.  If you are age 5 or younger, your body mass index should be between 19-25. Your Body mass index is 28.11 kg/m. If this is out of the aformentioned range listed, please consider follow up with your Primary Care Provider.   You have been scheduled for a colonoscopy. Please follow written instructions given to you at your visit today.  Please pick up your prep supplies at the pharmacy within the next 1-3 days. If you use inhalers (even only as needed), please bring them with you on the day of your procedure.  INCREASE Miralax to twice a day.  Follow up pending the results of your Colonoscopy.

## 2020-06-26 ENCOUNTER — Other Ambulatory Visit: Payer: Self-pay | Admitting: Internal Medicine

## 2020-06-28 ENCOUNTER — Encounter: Payer: Self-pay | Admitting: Internal Medicine

## 2020-07-01 ENCOUNTER — Encounter: Payer: Self-pay | Admitting: Internal Medicine

## 2020-07-01 ENCOUNTER — Other Ambulatory Visit: Payer: Self-pay

## 2020-07-01 ENCOUNTER — Ambulatory Visit (AMBULATORY_SURGERY_CENTER): Payer: Medicare PPO | Admitting: Internal Medicine

## 2020-07-01 VITALS — BP 141/69 | HR 63 | Temp 96.6°F | Resp 15 | Ht 69.0 in | Wt 190.0 lb

## 2020-07-01 DIAGNOSIS — D124 Benign neoplasm of descending colon: Secondary | ICD-10-CM

## 2020-07-01 DIAGNOSIS — R194 Change in bowel habit: Secondary | ICD-10-CM | POA: Diagnosis not present

## 2020-07-01 DIAGNOSIS — K5730 Diverticulosis of large intestine without perforation or abscess without bleeding: Secondary | ICD-10-CM

## 2020-07-01 DIAGNOSIS — D123 Benign neoplasm of transverse colon: Secondary | ICD-10-CM

## 2020-07-01 DIAGNOSIS — R195 Other fecal abnormalities: Secondary | ICD-10-CM | POA: Diagnosis not present

## 2020-07-01 MED ORDER — SODIUM CHLORIDE 0.9 % IV SOLN: 500.0000 mL | Freq: Once | INTRAVENOUS | Status: DC

## 2020-07-01 NOTE — Progress Notes (Signed)
pt tolerated well. VSS. awake and to recovery. Report given to RN.  

## 2020-07-01 NOTE — Patient Instructions (Addendum)
I found and removed 3 tiny polyps. You also have diverticulosis - thickened muscle rings and pouches in the colon wall. Please read the handout about this condition.   I would continue what you are doing for constipation - it is safe to do that.  I appreciate the opportunity to care for you. Gatha Mayer, MD, FACG  YOU HAD AN ENDOSCOPIC PROCEDURE TODAY AT [facility]:   Refer to the procedure report that was given to you for any specific questions about what was found during the examination.  If the procedure report does not answer your questions, please call your gastroenterologist to clarify.  If you requested that your care partner not be given the details of your procedure findings, then the procedure report has been included in a sealed envelope for you to review at your convenience later.  YOU SHOULD EXPECT: Some feelings of bloating in the abdomen. Passage of more gas than usual.  Walking can help get rid of the air that was put into your GI tract during the procedure and reduce the bloating. If you had a lower endoscopy (such as a colonoscopy or flexible sigmoidoscopy) you may notice spotting of blood in your stool or on the toilet paper. If you underwent a bowel prep for your procedure, you may not have a normal bowel movement for a few days.  Please Note:  You might notice some irritation and congestion in your nose or some drainage.  This is from the oxygen used during your procedure.  There is no need for concern and it should clear up in a day or so.  SYMPTOMS TO REPORT IMMEDIATELY:   Following lower endoscopy (colonoscopy or flexible sigmoidoscopy):  Excessive amounts of blood in the stool  Significant tenderness or worsening of abdominal pains  Swelling of the abdomen that is new, acute  Fever of 100F or higher  For urgent or emergent issues, a gastroenterologist can be reached at any hour by calling [phone]. Do not use MyChart messaging for urgent  concerns.    DIET:  We do recommend a small meal at first, but then you may proceed to your regular diet.  Drink plenty of fluids but you should avoid alcoholic beverages for 24 hours.  ACTIVITY:  You should plan to take it easy for the rest of today and you should NOT DRIVE or use heavy machinery until tomorrow (because of the sedation medicines used during the test).    FOLLOW UP: Our staff will call the number listed on your records 48-72 hours following your procedure to check on you and address any questions or concerns that you may have regarding the information given to you following your procedure. If we do not reach you, we will leave a message.  We will attempt to reach you two times.  During this call, we will ask if you have developed any symptoms of COVID 19. If you develop any symptoms (ie: fever, flu-like symptoms, shortness of breath, cough etc.) before then, please call [phone].  If you test positive for Covid 19 in the 2 weeks post procedure, please call and report this information to [place].    If any biopsies were taken you will be contacted by phone or by letter within the next 1-3 weeks.  Please call us at [phone] if you have not heard about the biopsies in 3 weeks.    SIGNATURES/CONFIDENTIALITY: You and/or your care partner have signed paperwork which will be entered into your  electronic medical record.  These signatures attest to the fact that that the information above on your After Visit Summary has been reviewed and is understood.  Full responsibility of the confidentiality of this discharge information lies with you and/or your care-partner.

## 2020-07-01 NOTE — Op Note (Signed)
[place] Patient Name: Darren Barnes Procedure Date: 07/01/2020 7:55 AM MRN: [id number] Endoscopist: Gatha Mayer , MD Age: 79 Referring MD:  Date of Birth: [birth year] Gender: Male Account #: [id number] Procedure:                Colonoscopy Indications:              Change in bowel habits Medicines:                Propofol per Anesthesia Procedure:                Pre-Anesthesia Assessment:                           - Prior to the procedure, a History and Physical                            was performed, and patient medications and                            allergies were reviewed. The patient's tolerance of                            previous anesthesia was also reviewed. The risks                            and benefits of the procedure and the sedation                            options and risks were discussed with the patient.                            All questions were answered, and informed consent                            was obtained. Prior Anticoagulants: The patient has                            taken no previous anticoagulant or antiplatelet                            agents. ASA Grade Assessment: III - A patient with                            severe systemic disease. After reviewing the risks                            and benefits, the patient was deemed in                            satisfactory condition to undergo the procedure.                           After obtaining informed consent, the colonoscope  was passed under direct vision. Throughout the                            procedure, the patient's blood pressure, pulse, and                            oxygen saturations were monitored continuously. The                            Colonoscope was introduced through the anus and                            advanced to the the cecum, identified by                            appendiceal orifice and ileocecal valve. The                             colonoscopy was performed without difficulty. The                            patient tolerated the procedure well. The quality                            of the bowel preparation was good. The ileocecal                            valve, appendiceal orifice, and rectum were                            photographed. Scope In: 8:07:04 AM Scope Out: 8:23:12 AM Scope Withdrawal Time: 0 hours 11 minutes 58 seconds  Total Procedure Duration: 0 hours 16 minutes 8 seconds  Findings:                 The perianal and digital rectal examinations were                            normal. Pertinent negatives include normal prostate                            (size, shape, and consistency).                           Three sessile polyps were found in the descending                            colon and transverse colon. The polyps were                            diminutive in size. These polyps were removed with                            a cold snare. Resection and retrieval were  complete. Verification of patient identification                            for the specimen was done. Estimated blood loss was                            minimal.                           Multiple small and large-mouthed diverticula were                            found in the sigmoid colon and descending colon.                           Anal papilla(e) were hypertrophied.                           The exam was otherwise without abnormality on                            direct and retroflexion views. Complications:            No immediate complications. Estimated Blood Loss:     Estimated blood loss was minimal. Impression:               - Three diminutive polyps in the descending colon                            and in the transverse colon, removed with a cold                            snare. Resected and retrieved.                           - Severe diverticulosis in the sigmoid  colon and in                            the descending colon.                           - Anal papilla(e) were hypertrophied.                           - The examination was otherwise normal on direct                            and retroflexion views. Recommendation:           - Patient has a contact number available for                            emergencies. The signs and symptoms of potential                            delayed complications were discussed with the  patient. Return to normal activities tomorrow.                            Written discharge instructions were provided to the                            patient.                           - Resume previous diet.                           - Continue present medications.                           - No repeat colonoscopy due to age.                           - CONTINUE CURRENT LAXATIVE REGIMEN                           SEE GI AS NEEDED Gatha Mayer, MD 07/01/2020 8:37:42 AM This report has been signed electronically.

## 2020-07-05 ENCOUNTER — Telehealth: Payer: Self-pay

## 2020-07-05 NOTE — Telephone Encounter (Signed)
  Follow up Call-  Call back number 07/01/2020  Post procedure Call Back phone  # [phone]  Permission to leave phone message Yes  Some recent data might be hidden     Patient questions:  Do you have a fever, pain , or abdominal swelling? No. Pain Score  0 *  Have you tolerated food without any problems? Yes.    Have you been able to return to your normal activities? Yes.    Do you have any questions about your discharge instructions: Diet   No. Medications  No. Follow up visit  No.  Do you have questions or concerns about your Care? No.  Actions: * If pain score is 4 or above: No action needed, pain <4.  1. Have you developed a fever since your procedure? no  2.   Have you had an respiratory symptoms (SOB or cough) since your procedure? no  3.   Have you tested positive for COVID 19 since your procedure no  4.   Have you had any family members/close contacts diagnosed with the COVID 19 since your procedure?  no   If yes to any of these questions please route to Joylene John, RN and Erenest Rasher, RN

## 2020-07-05 NOTE — Telephone Encounter (Signed)
Called [phone] and left a messaged we tried to reach pt for a follow up call. maw

## 2020-07-09 ENCOUNTER — Encounter: Payer: Self-pay | Admitting: Internal Medicine

## 2020-07-14 ENCOUNTER — Other Ambulatory Visit: Payer: Self-pay | Admitting: Internal Medicine

## 2020-07-19 ENCOUNTER — Encounter: Payer: Self-pay | Admitting: Internal Medicine

## 2020-07-25 ENCOUNTER — Encounter: Payer: Self-pay | Admitting: Internal Medicine

## 2020-07-25 ENCOUNTER — Ambulatory Visit: Payer: Medicare PPO | Admitting: Internal Medicine

## 2020-07-25 ENCOUNTER — Other Ambulatory Visit: Payer: Self-pay

## 2020-07-25 VITALS — BP 112/70 | HR 59 | Temp 97.8°F | Resp 18 | Ht 69.0 in | Wt 192.5 lb

## 2020-07-25 DIAGNOSIS — K4090 Unilateral inguinal hernia, without obstruction or gangrene, not specified as recurrent: Secondary | ICD-10-CM

## 2020-07-25 DIAGNOSIS — L603 Nail dystrophy: Secondary | ICD-10-CM | POA: Diagnosis not present

## 2020-07-25 NOTE — Patient Instructions (Addendum)
Please schedule Medicare Wellness with Glenard Haring.   We are referring you to the podiatrist for the treatment of the nail infection.  Also to the general surgeon for evaluation of the hernia

## 2020-07-25 NOTE — Progress Notes (Signed)
Pre visit review using our clinic review tool, if applicable. No additional management support is needed unless otherwise documented below in the visit note. 

## 2020-07-25 NOTE — Progress Notes (Signed)
Subjective:    Patient ID: Darren Barnes, male    DOB: 16-Feb-1941, 79 y.o.   MRN: [id number]  DOS:  07/25/2020 Type of visit - description: Acute  Reports a 2-year history of a bulging area at the left suprapubic area.  Previously urologist told him that he has a hernia, has never mentioned that to me before. The hernia has increased in size lately, is occasionally painful. Denies nausea vomiting No scrotal swelling.  Also complains of dystrophic nails for years, here lately the great toe nails are so large is causing some discomfort.  Review of Systems See above   Past Medical History:  Diagnosis Date  . Cataract   . ED (erectile dysfunction)   . Glaucoma   . Hx of adenomatous colonic polyps 07/30/2017  . Hyperlipidemia   . Hypertension   . Nocturia    saw urology 2012, was eval, rx vesicare (after several other meds failed)  . Osteoarthritis   . Sleep apnea   . Thrombocytopenia ([place])    saw hematology, observe    Past Surgical History:  Procedure Laterality Date  . anal polyp resection  2008   Dr Debbora Presto  . Benign tumor from Left arm    . EYE SURGERY  02/22/2020  . KNEE ARTHROSCOPY  2004  . Sinus surgery for benign tumor      Allergies as of 07/25/2020   No Known Allergies     Medication List       Accurate as of July 25, 2020  8:25 AM. If you have any questions, ask your nurse or doctor.        Alphagan P 0.1 % Soln Generic drug: brimonidine Apply to eye.   bisacodyl 5 MG EC tablet Commonly known as: DULCOLAX Take 15 mg by mouth every 3 (three) days.   cephALEXin 250 MG capsule Commonly known as: KEFLEX Take 250 mg by mouth at bedtime.   diltiazem 240 MG 24 hr capsule Commonly known as: CARDIZEM CD Take 1 capsule (240 mg total) by mouth in the morning and at bedtime.   dorzolamide-timolol 22.3-6.8 MG/ML ophthalmic solution Commonly known as: COSOPT Place 1 drop into both eyes 2 (two) times daily.   Lumigan 0.01 % Soln Generic drug:  bimatoprost Place 1 drop into both eyes every evening.   multivitamin per tablet Take 1 tablet by mouth daily.   Rocklatan 0.02-0.005 % Soln Generic drug: Netarsudil-Latanoprost Apply to eye.   tamsulosin 0.4 MG Caps capsule Commonly known as: FLOMAX Take 1 capsule (0.4 mg total) by mouth in the morning and at bedtime.   telmisartan 40 MG tablet Commonly known as: MICARDIS Take 1 tablet (40 mg total) by mouth daily.          Objective:   Physical Exam Abdominal:      BP 112/70 (BP Location: Left Arm, Patient Position: Sitting, Cuff Size: Small)   Pulse 59   Temp 97.8 F (36.6 C) (Oral)   Resp 18   Ht 5\' 9"  (1.753 m)   Wt 192 lb 8 oz (87.3 kg)   SpO2 98%   BMI 28.43 kg/m  General:   Well developed, NAD, BMI noted. HEENT:  Normocephalic . Face symmetric, atraumatic GU: Penis and scrotal contents normal. No inguinal lymphadenopathies. See graphic.   Lower extremities: no pretibial edema bilaterally  Skin: All toenails dystrophic, particularly large are the great toenail L>R Neurologic:  alert & oriented X3.  Speech normal, gait appropriate for age and unassisted Psych--  Cognition  and judgment appear intact.  Cooperative with normal attention span and concentration.  Behavior appropriate. No anxious or depressed appearing.      Assessment      Assessment   HTN- on Cardizem for a while, added losartan 10-2015 Hyperlipidemia ED DJD- s/p knee injections (flexogenic) ~ 2016; I signed a parking permit OSA : not using  CPAP as off 10-2017 Glaucoma Abdominal wall mass see physical exam 05-25-2016 H/o Thrombocytopenia, saw hematology, rx  observation GU: --H/o LUTS- Nocturia  Saw  Urologist ~2012 underwent full workup >> dx BPH and detrusor instability, failed several medications --Re-eval by urology:12-2016 Recurrence cystitis, chronic prostatitis  PLAN:  Left suprapubic hernia: Reducible, refer to general surgery.  Hernia asymptomatic (pain) and has  increased in size lately.  If severe symptoms such as nausea, vomiting, go to the ER. Dystrophic nails: Likely a fungal infection, given size of toenails recommend to see the podiatrist for further evaluation.  I suppose he will get trimming, hopefully also KOH/culture to document infection and if the patient decides to get treated with oral antifungals for 3 months that would be okay.   This visit occurred during the SARS-CoV-2 public health emergency.  Safety protocols were in place, including screening questions prior to the visit, additional usage of staff PPE, and extensive cleaning of exam room while observing appropriate contact time as indicated for disinfecting solutions.

## 2020-07-26 NOTE — Assessment & Plan Note (Signed)
Left suprapubic hernia: Reducible, refer to general surgery.  Hernia asymptomatic (pain) and has increased in size lately.  If severe symptoms such as nausea, vomiting, go to the ER. Dystrophic nails: Likely a fungal infection, given size of toenails recommend to see the podiatrist for further evaluation.  I suppose he will get trimming, hopefully also KOH/culture to document infection and if the patient decides to get treated with oral antifungals for 3 months that would be okay.

## 2020-08-01 HISTORY — PX: OTHER SURGICAL HISTORY: SHX169

## 2020-08-19 ENCOUNTER — Other Ambulatory Visit: Payer: Self-pay | Admitting: Surgery

## 2020-08-19 DIAGNOSIS — K4090 Unilateral inguinal hernia, without obstruction or gangrene, not specified as recurrent: Secondary | ICD-10-CM | POA: Diagnosis not present

## 2020-08-22 ENCOUNTER — Other Ambulatory Visit: Payer: Self-pay

## 2020-08-22 ENCOUNTER — Ambulatory Visit: Payer: Medicare PPO | Admitting: Podiatry

## 2020-08-22 DIAGNOSIS — B351 Tinea unguium: Secondary | ICD-10-CM

## 2020-08-22 DIAGNOSIS — M79674 Pain in right toe(s): Secondary | ICD-10-CM | POA: Diagnosis not present

## 2020-08-22 DIAGNOSIS — M79675 Pain in left toe(s): Secondary | ICD-10-CM | POA: Diagnosis not present

## 2020-08-22 MED ORDER — EFINACONAZOLE 10 % EX SOLN: CUTANEOUS | 2 refills | Status: DC

## 2020-08-22 NOTE — Progress Notes (Signed)
  Subjective:  Patient ID: Darren Barnes, male    DOB: [birth year],  MRN: [id number]  Chief Complaint  Patient presents with  . Nail Problem    Bilateral 1-5 nail fungus. Pt interested in antifungal therapy.    79 y.o. male presents with the above complaint. History confirmed with patient.   Objective:  Physical Exam: warm, good capillary refill, no trophic changes or ulcerative lesions, normal DP and PT pulses and normal sensory exam.  Onychomycosis x10        Assessment:   1. Onychomycosis   2. Pain due to onychomycosis of toenails of both feet      Plan:  Patient was evaluated and treated and all questions answered.  Discussed the etiology and treatment options for the condition in detail with the patient. Educated patient on the topical and oral treatment options for mycotic nails.  Discussed treatment options including appropriate shoe gear. He was like to treat this with topical antifungal therapy.  Also discussed laser and oral treatment with him.  Prescription for Jublia sent to his pharmacy.  If this fails we will consider terbinafine treatment.   No follow-ups on file.

## 2020-08-26 ENCOUNTER — Other Ambulatory Visit: Payer: Self-pay

## 2020-08-26 ENCOUNTER — Encounter (HOSPITAL_BASED_OUTPATIENT_CLINIC_OR_DEPARTMENT_OTHER): Payer: Self-pay | Admitting: Surgery

## 2020-08-26 NOTE — Progress Notes (Signed)
Spoke w/ via phone for pre-op interview---pt Lab needs dos----     I stat 8          Lab results------ekg 06-21-2020 epic COVID test ------08-30-2020 855 am Arrive at -------700 am 09-01-2020 NPO after MN NO Solid Food.  Clear liquids from MN until---600 am then npo Medications to take morning of surgery -----diltiazem, tamsulosin, eye drops Diabetic medication -----n/a Patient Special Instructions -----none Pre-Op special Istructions -----none Patient verbalized understanding of instructions that were given at this phone interview. Patient denies shortness of breath, chest pain, fever, cough at this phone interview.

## 2020-08-30 ENCOUNTER — Other Ambulatory Visit (HOSPITAL_COMMUNITY)
Admission: RE | Admit: 2020-08-30 | Discharge: 2020-08-30 | Disposition: A | Discharge status: Home / Self Care | Payer: Medicare PPO | Source: Ambulatory Visit | Attending: Surgery | Admitting: Surgery

## 2020-08-30 DIAGNOSIS — Z20822 Contact with and (suspected) exposure to covid-19: Secondary | ICD-10-CM | POA: Insufficient documentation

## 2020-08-30 DIAGNOSIS — Z01812 Encounter for preprocedural laboratory examination: Secondary | ICD-10-CM | POA: Diagnosis not present

## 2020-08-30 LAB — SARS CORONAVIRUS 2 (TAT 6-24 HRS): SARS Coronavirus 2: NEGATIVE

## 2020-08-31 NOTE — H&P (Signed)
Darren Barnes Appointment: 08/19/2020 11:00 AM Location: [facility] Patient #: [id number] DOB: [birth year] Married / Language: English / Race: Black or African American Male   History of Present Illness (Darren Barnes [name] MD; 08/19/2020 11:11 AM) The patient is a 79 year old male who presents with an inguinal hernia.  Chief complaint: Left inguinal hernia  This is a pleasant 79 year old gentleman referred by Dr. Larose Kells for a symptomatic left inguinal hernia. The patient reports he has had it for several years though it was a symptomatic until recently. He is now having severe pain daily from the hernia. He reports it is been difficult to reduce. He has constipation but no other obstructive symptoms. He has no nausea or vomiting. The pain is moderate and sharp in intensity.   Past Surgical History Darren Barnes, CMA; 08/19/2020 10:56 AM) Cataract Surgery  Right. Vasectomy   Diagnostic Studies History Darren Barnes, CMA; 08/19/2020 10:56 AM) Colonoscopy  5-10 years ago  Allergies Darren Barnes, CMA; 08/19/2020 10:57 AM) No Known Drug Allergies  [08/19/2020]: Allergies Reconciled   Medication History Darren Barnes, CMA; 08/19/2020 10:57 AM) Alphagan P (0.1% Solution, Ophthalmic) Active. Cephalexin (250MG  Capsule, Oral) Active. dilTIAZem HCl ER Coated Beads (240MG  Capsule ER 24HR, Oral) Active. Dorzolamide HCl-Timolol Mal (22.3-6.8MG /ML Solution, Ophthalmic) Active. Ketorolac Tromethamine (0.5% Solution, Ophthalmic) Active. Tamsulosin HCl (0.4MG  Capsule, Oral) Active. Telmisartan (40MG  Tablet, Oral) Active. Medications Reconciled  Social History Darren Barnes, CMA; 08/19/2020 10:56 AM) Alcohol use  Moderate alcohol use. Caffeine use  Coffee. No drug use  Tobacco use  Former smoker.  Family History Darren Barnes, CMA; 08/19/2020 10:56 AM) Alcohol Abuse  Father. Arthritis  Mother. Diabetes Mellitus  Mother. Heart Disease   Father. Hypertension  Mother.  Other Problems Darren Barnes, CMA; 08/19/2020 10:56 AM) Diverticulosis  Enlarged Prostate  Hemorrhoids  High blood pressure  Hypercholesterolemia  Sleep Apnea     Review of Systems ([name] CMA; 08/19/2020 10:56 AM) General Present- Weight Loss. Not Present- Appetite Loss, Chills, Fatigue, Fever, Night Sweats and Weight Gain. Skin Not Present- Change in Wart/Mole, Dryness, Hives, Jaundice, New Lesions, Non-Healing Wounds, Rash and Ulcer. HEENT Present- Visual Disturbances. Not Present- Earache, Hearing Loss, Hoarseness, Nose Bleed, Oral Ulcers, Ringing in the Ears, Seasonal Allergies, Sinus Pain, Sore Throat, Wears glasses/contact lenses and Yellow Eyes. Respiratory Present- Snoring. Not Present- Bloody sputum, Chronic Cough, Difficulty Breathing and Wheezing. Breast Not Present- Breast Mass, Breast Pain, Nipple Discharge and Skin Changes. Cardiovascular Not Present- Chest Pain, Difficulty Breathing Lying Down, Leg Cramps, Palpitations, Rapid Heart Rate, Shortness of Breath and Swelling of Extremities. Gastrointestinal Present- Change in Bowel Habits. Not Present- Abdominal Pain, Bloating, Bloody Stool, Chronic diarrhea, Constipation, Difficulty Swallowing, Excessive gas, Gets full quickly at meals, Hemorrhoids, Indigestion, Nausea, Rectal Pain and Vomiting. Male Genitourinary Present- Frequency and Impotence. Not Present- Blood in Urine, Change in Urinary Stream, Nocturia, Painful Urination, Urgency and Urine Leakage. Musculoskeletal Not Present- Back Pain, Joint Pain, Joint Stiffness, Muscle Pain, Muscle Weakness and Swelling of Extremities. Neurological Present- Trouble walking. Not Present- Decreased Memory, Fainting, Headaches, Numbness, Seizures, Tingling, Tremor and Weakness. Psychiatric Not Present- Anxiety, Bipolar, Change in Sleep Pattern, Depression, Fearful and Frequent crying. Endocrine Present- Cold Intolerance. Not Present- Excessive  Hunger, Hair Changes, Heat Intolerance, Hot flashes and New Diabetes. Hematology Not Present- Blood Thinners, Easy Bruising, Excessive bleeding, Gland problems, HIV and Persistent Infections.  Vitals ([name] CMA; 08/19/2020 10:57 AM) 08/19/2020 10:56 AM Weight: 191.4 lb Height: 69in Body Surface Area: 2.03 m Body Mass Index:  28.26 kg/m  Temp.: 97.70F  Pulse: 59 (Regular)  BP: 126/72(Sitting, Left Arm, Standard)       Physical Exam ([name] [name] MD; 08/19/2020 11:11 AM) The physical exam findings are as follows: Note: He appears well in exam.  On abdominal exam, he has a large, tender, difficult to reduce but reducible left inguinal hernia. There is no evidence of right inguinal hernia for umbilical hernia.    Assessment & Plan ([name] [name] MD; 08/19/2020 11:12 AM) LEFT INGUINAL HERNIA (K40.90) Impression: I reviewed his notes in the electronic medical records. He does have a large left inguinal hernia which is difficult to reduce and is increasingly symptomatic. I discussed the diagnosis of the patient is like. I discussed abdominal wall anatomy. I discussed hernia repair with mesh. I believe he needs more urgent repair of this hernia given his symptoms. I discussed proceeding with an open repair with mesh as an outpatient. I discussed the procedure in detail. I discussed the risks which includes but is not limited to bleeding, infection, injury to surrounding structures, use of mesh, hernia recurrence, nerve entrapment, chronic pain, pulmonary issues, etc. He understands and wishes to proceed with surgery ASAP.  This patient encounter took 30 minutes today to perform the following: take history, perform exam, review outside records, interpret imaging, counsel the patient on their diagnosis and document encounter, findings & plan in the EHR

## 2020-08-31 NOTE — Anesthesia Preprocedure Evaluation (Addendum)
Anesthesia Evaluation  Patient identified by MRN, date of birth, ID band  Reviewed: Allergy & Precautions, NPO status , Patient's Chart, lab work & pertinent test results  Airway Mallampati: II  TM Distance: >3 FB Neck ROM: Full    Dental no notable dental hx. (+) Teeth Intact, Dental Advisory Given   Pulmonary sleep apnea , former smoker,  Stopped wearing CPAP   Pulmonary exam normal breath sounds clear to auscultation       Cardiovascular hypertension, Pt. on medications Normal cardiovascular exam Rhythm:Regular Rate:Normal     Neuro/Psych    GI/Hepatic negative GI ROS, Neg liver ROS,   Endo/Other  negative endocrine ROS  Renal/GU negative Renal ROS     Musculoskeletal  (+) Arthritis ,   Abdominal   Peds  Hematology negative hematology ROS (+)   Anesthesia Other Findings   Reproductive/Obstetrics                            Anesthesia Physical Anesthesia Plan  ASA: III  Anesthesia Plan: General   Post-op Pain Management:  Regional for Post-op pain   Induction: Intravenous  PONV Risk Score and Plan: 3 and Treatment may vary due to age or medical condition and Ondansetron  Airway Management Planned: LMA  Additional Equipment: None  Intra-op Plan:   Post-operative Plan:   Informed Consent: I have reviewed the patients History and Physical, chart, labs and discussed the procedure including the risks, benefits and alternatives for the proposed anesthesia with the patient or authorized representative who has indicated his/her understanding and acceptance.     Dental advisory given  Plan Discussed with: CRNA  Anesthesia Plan Comments: (LMA w L TAP block)       Anesthesia Quick Evaluation

## 2020-09-01 ENCOUNTER — Encounter (HOSPITAL_BASED_OUTPATIENT_CLINIC_OR_DEPARTMENT_OTHER)
Admission: RE | Disposition: A | Discharge status: Home / Self Care | Payer: Self-pay | Source: Home / Self Care | Attending: Surgery

## 2020-09-01 ENCOUNTER — Ambulatory Visit (HOSPITAL_BASED_OUTPATIENT_CLINIC_OR_DEPARTMENT_OTHER): Payer: Medicare PPO | Admitting: Anesthesiology

## 2020-09-01 ENCOUNTER — Ambulatory Visit (HOSPITAL_BASED_OUTPATIENT_CLINIC_OR_DEPARTMENT_OTHER)
Admission: RE | Admit: 2020-09-01 | Discharge: 2020-09-01 | Disposition: A | Discharge status: Home / Self Care | Payer: Medicare PPO | Attending: Surgery | Admitting: Surgery

## 2020-09-01 ENCOUNTER — Encounter (HOSPITAL_BASED_OUTPATIENT_CLINIC_OR_DEPARTMENT_OTHER): Payer: Self-pay | Admitting: Surgery

## 2020-09-01 DIAGNOSIS — G8918 Other acute postprocedural pain: Secondary | ICD-10-CM | POA: Diagnosis not present

## 2020-09-01 DIAGNOSIS — Z87891 Personal history of nicotine dependence: Secondary | ICD-10-CM | POA: Insufficient documentation

## 2020-09-01 DIAGNOSIS — K4090 Unilateral inguinal hernia, without obstruction or gangrene, not specified as recurrent: Secondary | ICD-10-CM | POA: Diagnosis not present

## 2020-09-01 DIAGNOSIS — M1990 Unspecified osteoarthritis, unspecified site: Secondary | ICD-10-CM | POA: Diagnosis not present

## 2020-09-01 DIAGNOSIS — G4730 Sleep apnea, unspecified: Secondary | ICD-10-CM | POA: Diagnosis not present

## 2020-09-01 DIAGNOSIS — I10 Essential (primary) hypertension: Secondary | ICD-10-CM | POA: Insufficient documentation

## 2020-09-01 DIAGNOSIS — G4733 Obstructive sleep apnea (adult) (pediatric): Secondary | ICD-10-CM | POA: Diagnosis not present

## 2020-09-01 HISTORY — DX: Unilateral inguinal hernia, without obstruction or gangrene, not specified as recurrent: K40.90

## 2020-09-01 HISTORY — PX: INGUINAL HERNIA REPAIR: SHX194

## 2020-09-01 LAB — POCT I-STAT, CHEM 8
BUN: 13 mg/dL (ref 8–23)
Calcium, Ion: 1.3 mmol/L (ref 1.15–1.40)
Chloride: 104 mmol/L (ref 98–111)
Creatinine, Ser: 1 mg/dL (ref 0.61–1.24)
Glucose, Bld: 108 mg/dL — ABNORMAL HIGH (ref 70–99)
HCT: 46 % (ref 39.0–52.0)
Hemoglobin: 15.6 g/dL (ref 13.0–17.0)
Potassium: 4 mmol/L (ref 3.5–5.1)
Sodium: 141 mmol/L (ref 135–145)
TCO2: 22 mmol/L (ref 22–32)

## 2020-09-01 SURGERY — REPAIR, HERNIA, INGUINAL, ADULT
Anesthesia: General | Site: Inguinal | Laterality: Left

## 2020-09-01 MED ORDER — DEXAMETHASONE SODIUM PHOSPHATE 10 MG/ML IJ SOLN
INTRAMUSCULAR | Status: DC | PRN
  Administered 2020-09-01: 5 mg via INTRAVENOUS

## 2020-09-01 MED ORDER — FENTANYL CITRATE (PF) 100 MCG/2ML IJ SOLN
INTRAMUSCULAR | Status: AC
  Filled 2020-09-01: qty 2

## 2020-09-01 MED ORDER — ONDANSETRON HCL 4 MG/2ML IJ SOLN
INTRAMUSCULAR | Status: DC | PRN
  Administered 2020-09-01: 4 mg via INTRAVENOUS

## 2020-09-01 MED ORDER — MIDAZOLAM HCL 2 MG/2ML IJ SOLN
INTRAMUSCULAR | Status: AC
  Filled 2020-09-01: qty 2

## 2020-09-01 MED ORDER — ROPIVACAINE HCL 5 MG/ML IJ SOLN
INTRAMUSCULAR | Status: DC | PRN
  Administered 2020-09-01: 30 mL

## 2020-09-01 MED ORDER — ENSURE PRE-SURGERY PO LIQD: 296.0000 mL | Freq: Once | ORAL | Status: DC

## 2020-09-01 MED ORDER — PROPOFOL 10 MG/ML IV BOLUS
INTRAVENOUS | Status: DC | PRN
  Administered 2020-09-01: 80 mg via INTRAVENOUS

## 2020-09-01 MED ORDER — LIDOCAINE HCL (CARDIAC) PF 100 MG/5ML IV SOSY
PREFILLED_SYRINGE | INTRAVENOUS | Status: DC | PRN
  Administered 2020-09-01: 80 mg via INTRAVENOUS

## 2020-09-01 MED ORDER — ACETAMINOPHEN 500 MG PO TABS
1000.0000 mg | ORAL_TABLET | ORAL | Status: AC
  Administered 2020-09-01: 1000 mg via ORAL

## 2020-09-01 MED ORDER — CHLORHEXIDINE GLUCONATE CLOTH 2 % EX PADS: 6.0000 | MEDICATED_PAD | Freq: Once | CUTANEOUS | Status: DC

## 2020-09-01 MED ORDER — GABAPENTIN 100 MG PO CAPS
ORAL_CAPSULE | ORAL | Status: AC
  Filled 2020-09-01: qty 1

## 2020-09-01 MED ORDER — LACTATED RINGERS IV SOLN: INTRAVENOUS | Status: DC

## 2020-09-01 MED ORDER — BUPIVACAINE-EPINEPHRINE 0.5% -1:200000 IJ SOLN
INTRAMUSCULAR | Status: DC | PRN
  Administered 2020-09-01: 10 mL

## 2020-09-01 MED ORDER — CLONIDINE HCL (ANALGESIA) 100 MCG/ML EP SOLN
EPIDURAL | Status: DC | PRN
  Administered 2020-09-01: 100 ug

## 2020-09-01 MED ORDER — FENTANYL CITRATE (PF) 100 MCG/2ML IJ SOLN
50.0000 ug | Freq: Once | INTRAMUSCULAR | Status: AC
  Administered 2020-09-01: 50 ug via INTRAVENOUS

## 2020-09-01 MED ORDER — TRAMADOL HCL 50 MG PO TABS: 50.0000 mg | ORAL_TABLET | Freq: Four times a day (QID) | ORAL | 0 refills | Status: DC | PRN

## 2020-09-01 MED ORDER — ACETAMINOPHEN 500 MG PO TABS
ORAL_TABLET | ORAL | Status: AC
  Filled 2020-09-01: qty 2

## 2020-09-01 MED ORDER — CEFAZOLIN SODIUM-DEXTROSE 2-4 GM/100ML-% IV SOLN
2.0000 g | INTRAVENOUS | Status: AC
  Administered 2020-09-01: 2 g via INTRAVENOUS

## 2020-09-01 MED ORDER — GABAPENTIN 100 MG PO CAPS
100.0000 mg | ORAL_CAPSULE | ORAL | Status: AC
  Administered 2020-09-01: 100 mg via ORAL

## 2020-09-01 MED ORDER — EPHEDRINE SULFATE-NACL 50-0.9 MG/10ML-% IV SOSY
PREFILLED_SYRINGE | INTRAVENOUS | Status: DC | PRN
  Administered 2020-09-01 (×2): 10 mg via INTRAVENOUS

## 2020-09-01 MED ORDER — DEXMEDETOMIDINE (PRECEDEX) IN NS 20 MCG/5ML (4 MCG/ML) IV SYRINGE
PREFILLED_SYRINGE | INTRAVENOUS | Status: DC | PRN
  Administered 2020-09-01: 4 ug via INTRAVENOUS

## 2020-09-01 MED ORDER — CEFAZOLIN SODIUM-DEXTROSE 2-4 GM/100ML-% IV SOLN
INTRAVENOUS | Status: AC
  Filled 2020-09-01: qty 100

## 2020-09-01 SURGICAL SUPPLY — 39 items
BLADE CLIPPER SENSICLIP SURGIC (BLADE) IMPLANT
BLADE HEX COATED 2.75 (ELECTRODE) ×2 IMPLANT
BLADE SURG 10 STRL SS (BLADE) ×2 IMPLANT
BLADE SURG 15 STRL LF DISP TIS (BLADE) ×1 IMPLANT
BLADE SURG 15 STRL SS (BLADE) ×2
CHLORAPREP W/TINT 26 (MISCELLANEOUS) ×2 IMPLANT
COVER BACK TABLE 60X90IN (DRAPES) ×2 IMPLANT
COVER MAYO STAND STRL (DRAPES) ×2 IMPLANT
COVER WAND RF STERILE (DRAPES) ×2 IMPLANT
DERMABOND ADVANCED (GAUZE/BANDAGES/DRESSINGS) ×1
DERMABOND ADVANCED .7 DNX12 (GAUZE/BANDAGES/DRESSINGS) ×1 IMPLANT
DRAIN PENROSE 0.5X18 (DRAIN) ×2 IMPLANT
DRAPE LAPAROTOMY TRNSV 102X78 (DRAPES) ×2 IMPLANT
DRAPE UTILITY XL STRL (DRAPES) ×2 IMPLANT
ELECT REM PT RETURN 9FT ADLT (ELECTROSURGICAL) ×2
ELECTRODE REM PT RTRN 9FT ADLT (ELECTROSURGICAL) ×1 IMPLANT
GLOVE BIO SURGEON STRL SZ7 (GLOVE) ×2 IMPLANT
GLOVE BIOGEL PI IND STRL 7.0 (GLOVE) ×1 IMPLANT
GLOVE BIOGEL PI IND STRL 7.5 (GLOVE) ×2 IMPLANT
GLOVE BIOGEL PI INDICATOR 7.0 (GLOVE) ×1
GLOVE BIOGEL PI INDICATOR 7.5 (GLOVE) ×2
GOWN STRL REUS W/ TWL XL LVL3 (GOWN DISPOSABLE) ×1 IMPLANT
GOWN STRL REUS W/TWL LRG LVL3 (GOWN DISPOSABLE) ×4 IMPLANT
GOWN STRL REUS W/TWL XL LVL3 (GOWN DISPOSABLE) ×2
KIT TURNOVER CYSTO (KITS) ×2 IMPLANT
MESH PARIETEX PROGRIP LEFT (Mesh General) ×2 IMPLANT
NEEDLE HYPO 25X1 1.5 SAFETY (NEEDLE) ×2 IMPLANT
NS IRRIG 500ML POUR BTL (IV SOLUTION) ×2 IMPLANT
PACK BASIN DAY SURGERY FS (CUSTOM PROCEDURE TRAY) ×2 IMPLANT
PENCIL SMOKE EVACUATOR (MISCELLANEOUS) ×2 IMPLANT
SPONGE LAP 4X18 RFD (DISPOSABLE) ×2 IMPLANT
SUT MNCRL AB 4-0 PS2 18 (SUTURE) ×2 IMPLANT
SUT SILK 2 0 SH (SUTURE) ×2 IMPLANT
SUT VIC AB 2-0 CT1 27 (SUTURE) ×2
SUT VIC AB 2-0 CT1 TAPERPNT 27 (SUTURE) ×1 IMPLANT
SUT VIC AB 3-0 CT1 27 (SUTURE) ×2
SUT VIC AB 3-0 CT1 TAPERPNT 27 (SUTURE) ×1 IMPLANT
SYR CONTROL 10ML LL (SYRINGE) ×2 IMPLANT
TOWEL OR 17X26 10 PK STRL BLUE (TOWEL DISPOSABLE) ×2 IMPLANT

## 2020-09-01 NOTE — Anesthesia Postprocedure Evaluation (Signed)
Anesthesia Post Note  Patient: Darren Barnes  Procedure(s) Performed: OPEN LEFT INGUINAL HERNIA REPAIR WITH MESH (Left Inguinal)     Patient location during evaluation: PACU Anesthesia Type: General Level of consciousness: awake and alert Pain management: pain level controlled Vital Signs Assessment: post-procedure vital signs reviewed and stable Respiratory status: spontaneous breathing, nonlabored ventilation, respiratory function stable and patient connected to nasal cannula oxygen Cardiovascular status: blood pressure returned to baseline and stable Postop Assessment: no apparent nausea or vomiting Anesthetic complications: no   No complications documented.  Last Vitals:  Vitals:   09/01/20 0927 09/01/20 0935  BP: 123/67 123/67  Pulse:  (!) 45  Resp:  16  Temp:  (!) 36.4 C  SpO2:  98%    Last Pain:  Vitals:   09/01/20 0935  TempSrc: Oral  PainSc:                  Barnet Glasgow

## 2020-09-01 NOTE — Anesthesia Procedure Notes (Signed)
Anesthesia Regional Block: TAP block   Pre-Anesthetic Checklist: ,, timeout performed, Correct Patient, Correct Site, Correct Laterality, Correct Procedure, Correct Position, site marked, Risks and benefits discussed, Surgical consent,  At surgeon's request and post-op pain management  Laterality: Left  Prep: Maximum Sterile Barrier Precautions used, chloraprep       Needles:   Needle Type: Echogenic Needle      Needle Gauge: 21     Additional Needles:   Procedures:,,,, ultrasound used (permanent image in chart),,,,  Narrative:  Start time: 09/01/2020 8:25 AM End time: 09/01/2020 8:30 AM  Performed by: Personally  Anesthesiologist: Barnet Glasgow, MD

## 2020-09-01 NOTE — Op Note (Signed)
OPEN LEFT INGUINAL HERNIA REPAIR WITH MESH  Procedure Note  Darren Barnes 09/01/2020   Pre-op Diagnosis: LEFT INGUINAL HERNIA     Post-op Diagnosis: same  Procedure(s): OPEN LEFT INGUINAL HERNIA REPAIR WITH MESH  Surgeon(s): Coralie Keens, MD  Anesthesia: General  Staff:  Circulator: Shana Chute Relief Scrub: Drucie Ip A Scrub Person: Calvert Cantor  Estimated Blood Loss: Minimal               Findings: The patient was found to have a large indirect left inguinal hernia.  It was repaired with a piece of large Prolene ProGrip mesh from [employer]  Procedure: The patient was brought to operating room identifies correct patient.  He is placed upon the operating table general anesthesia was induced.  He had already received a left-sided tap block by the anesthesiologist preoperatively.  His abdomen was prepped and draped in usual sterile fashion.  I anesthetized skin left inguinal area with Marcaine.  I then made a longitudinal incision with a scalpel.  I then dissected down through Scarpa's fascia with the electrocautery.  The external beak fascia was identified and opened toward the internal and external ring.  The testicular cord and structures including a large indirect hernia sac were controlled with a Penrose drain.  I separated the thickened sac from the cord structures and dissected down towards base.  I opened up the sac and all contents have been reduced back to the abdominal cavity.  I then tied off the base of the sac with a 2-0 silk suture and excised the redundant sac with the cautery.  The patient had a very large internal ring so I closed this lightly with a 2-0 silk suture.  Next a large piece of Prolene ProGrip mesh from [employer] was brought to the field.  I placed against pubic tubercle and then brought around the cord structures covering the internal ring and inguinal floor.  I then sutured the mesh in place with several 2-0 Vicryl sutures.  Wide  coverage of the inguinal floor and internal ring appeared to be achieved.  I then closed the external beak fascia over the top of this with a running 2-0 Vicryl suture.  Scarpa's fascia then closed interrupted 3-0 Vicryl sutures and the skin was closed with running 4-0 Monocryl.  Dermabond was then applied.  The patient tolerated the procedure well.  All the counts were correct at the end of the procedure.  The patient was then extubated in the operating room and taken in a stable condition to the recovery room.          Coralie Keens   Date: 09/01/2020  Time: 9:17 AM

## 2020-09-01 NOTE — Anesthesia Procedure Notes (Signed)
Procedure Name: LMA Insertion Date/Time: 09/01/2020 8:45 AM Performed by: Raenette Rover, CRNA Pre-anesthesia Checklist: Patient identified, Emergency Drugs available, Suction available and Patient being monitored Patient Re-evaluated:Patient Re-evaluated prior to induction Oxygen Delivery Method: Circle system utilized Preoxygenation: Pre-oxygenation with 100% oxygen Induction Type: IV induction LMA: LMA inserted LMA Size: 4.0 Number of attempts: 1 Placement Confirmation: positive ETCO2 and breath sounds checked- equal and bilateral Tube secured with: Tape Dental Injury: Teeth and Oropharynx as per pre-operative assessment

## 2020-09-01 NOTE — Discharge Instructions (Signed)
Post Anesthesia Home Care Instructions  Activity: Get plenty of rest for the remainder of the day. A responsible adult should stay with you for 24 hours following the procedure.  For the next 24 hours, DO NOT: -Drive a car -Paediatric nurse -Drink alcoholic beverages -Take any medication unless instructed by your physician -Make any legal decisions or sign important papers.  Meals: Start with liquid foods such as gelatin or soup. Progress to regular foods as tolerated. Avoid greasy, spicy, heavy foods. If nausea and/or vomiting occur, drink only clear liquids until the nausea and/or vomiting subsides. Call your physician if vomiting continues.  Special Instructions/Symptoms: Your throat may feel dry or sore from the anesthesia or the breathing tube placed in your throat during surgery. If this causes discomfort, gargle with warm salt water. The discomfort should disappear within 24 hours.  If you had a scopolamine patch placed behind your ear for the management of post- operative nausea and/or vomiting:  1. The medication in the patch is effective for 72 hours, after which it should be removed.  Wrap patch in a tissue and discard in the trash. Wash hands thoroughly with soap and water. 2. You may remove the patch earlier than 72 hours if you experience unpleasant side effects which may include dry mouth, dizziness or visual disturbances. 3. Avoid touching the patch. Wash your hands with soap and water after contact with the patch.   [facility] _______[facility]  UMBILICAL OR INGUINAL HERNIA REPAIR: POST OP INSTRUCTIONS  Always review your discharge instruction sheet given to you by the facility where your surgery was performed. IF YOU HAVE DISABILITY OR FAMILY LEAVE FORMS, YOU MUST BRING THEM TO THE OFFICE FOR PROCESSING.   DO NOT GIVE THEM TO YOUR DOCTOR.  1. A  prescription for pain medication may be given to you upon discharge.  Take your pain medication as prescribed,  if needed.  If narcotic pain medicine is not needed, then you may take acetaminophen (Tylenol) or ibuprofen (Advil) as needed. 2. Take your usually prescribed medications unless otherwise directed. If you need a refill on your pain medication, please contact your pharmacy.  They will contact our office to request authorization. Prescriptions will not be filled after 5 pm or on week-ends. 3. You should follow a light diet the first 24 hours after arrival home, such as soup and crackers, etc.  Be sure to include lots of fluids daily.  Resume your normal diet the day after surgery. 4.Most patients will experience some swelling and bruising around the umbilicus or in the groin and scrotum.  Ice packs and reclining will help.  Swelling and bruising can take several days to resolve.  6. It is common to experience some constipation if taking pain medication after surgery.  Increasing fluid intake and taking a stool softener (such as Colace) will usually help or prevent this problem from occurring.  A mild laxative (Milk of Magnesia or Miralax) should be taken according to package directions if there are no bowel movements after 48 hours. 7. Unless discharge instructions indicate otherwise, you may remove your bandages 24-48 hours after surgery, and you may shower at that time.  You may have steri-strips (small skin tapes) in place directly over the incision.  These strips should be left on the skin for 7-10 days.  If your surgeon used skin glue on the incision, you may shower in 24 hours.  The glue will flake off over the next 2-3 weeks.  Any sutures or  staples will be removed at the office during your follow-up visit. 8. ACTIVITIES:  You may resume regular (light) daily activities beginning the next day--such as daily self-care, walking, climbing stairs--gradually increasing activities as tolerated.  You may have sexual intercourse when it is comfortable.  Refrain from any heavy lifting or straining until approved  by your doctor.  a.You may drive when you are no longer taking prescription pain medication, you can comfortably wear a seatbelt, and you can safely maneuver your car and apply brakes. b.RETURN TO WORK:   _____________________________________________  9.You should see your doctor in the office for a follow-up appointment approximately 2-3 weeks after your surgery.  Make sure that you call for this appointment within a day or two after you arrive home to insure a convenient appointment time. 10.OTHER INSTRUCTIONS: _OK TO SHOWER STARTING TOMORROW ICE PACK, TYLENOL, AND IBUPROFEN ALSO FOR PAIN NO LIFTING MORE THAN 15 POUNDS FOR 4 WEEKS________________________    _____________________________________  WHEN TO CALL YOUR DOCTOR: 1. Fever over 101.0 2. Inability to urinate 3. Nausea and/or vomiting 4. Extreme swelling or bruising 5. Continued bleeding from incision. 6. Increased pain, redness, or drainage from the incision  The clinic staff is available to answer your questions during regular business hours.  Please don't hesitate to call and ask to speak to one of the nurses for clinical concerns.  If you have a medical emergency, go to the nearest emergency room or call 911.  A surgeon from [facility] is always on call at the hospital   [address] [place], [place], [place]  [address] ?  [address], [place], [place]   [address] [phone] ? [phone] ? FAX [phone] Web site: [email or web address]

## 2020-09-01 NOTE — Transfer of Care (Signed)
Immediate Anesthesia Transfer of Care Note  Patient: Darren Barnes  Procedure(s) Performed: OPEN LEFT INGUINAL HERNIA REPAIR WITH MESH (Left Inguinal)  Patient Location: PACU  Anesthesia Type:GA combined with regional for post-op pain  Level of Consciousness: awake, alert , oriented, drowsy and patient cooperative  Airway & Oxygen Therapy: Patient Spontanous Breathing and Patient connected to nasal cannula oxygen  Post-op Assessment: Report given to RN and Post -op Vital signs reviewed and stable  Post vital signs: Reviewed and stable  Last Vitals:  Vitals Value Taken Time  BP 127/69 0924  Temp    Pulse 46   Resp    SpO2 100     Last Pain:  Vitals:   09/01/20 0835  TempSrc:   PainSc: 0-No pain      Patients Stated Pain Goal: 4 (36/[id number])  Complications: No complications documented.

## 2020-09-01 NOTE — Interval H&P Note (Signed)
History and Physical Interval Note: no change in  H and P  09/01/2020 8:00 AM  Darren Barnes  has presented today for surgery, with the diagnosis of LEFT INGUINAL HERNIA.  The various methods of treatment have been discussed with the patient and family. After consideration of risks, benefits and other options for treatment, the patient has consented to  Procedure(s): OPEN LEFT INGUINAL HERNIA REPAIR WITH MESH (Left) as a surgical intervention.  The patient's history has been reviewed, patient examined, no change in status, stable for surgery.  I have reviewed the patient's chart and labs.  Questions were answered to the patient's satisfaction.     Coralie Keens

## 2020-09-01 NOTE — Progress Notes (Signed)
Assisted Dr. Houser with left, ultrasound guided, transabdominal plane block. Side rails up, monitors on throughout procedure. See vital signs in flow sheet. Tolerated Procedure well. °

## 2020-09-02 ENCOUNTER — Encounter (HOSPITAL_BASED_OUTPATIENT_CLINIC_OR_DEPARTMENT_OTHER): Payer: Self-pay | Admitting: Surgery

## 2020-09-06 ENCOUNTER — Other Ambulatory Visit: Payer: Self-pay | Admitting: Podiatry

## 2020-09-06 ENCOUNTER — Telehealth: Payer: Self-pay | Admitting: Podiatry

## 2020-09-06 MED ORDER — CICLOPIROX 8 % EX SOLN: Freq: Every day | CUTANEOUS | 2 refills | Status: DC

## 2020-09-06 NOTE — Telephone Encounter (Signed)
Patient called an stated that his insurance will not cover Efinaconazole 10 % SOLN. Can you prescribe him something else for his toes.

## 2020-09-06 NOTE — Addendum Note (Signed)
Addended bySherryle Lis, Barnes Florek R on: 09/06/2020 10:35 AM   Modules accepted: Orders

## 2020-09-06 NOTE — Telephone Encounter (Signed)
Penlac sent to pharmacy in place of Darren Barnes

## 2020-09-08 NOTE — Telephone Encounter (Signed)
Please Advise

## 2020-09-13 DIAGNOSIS — H401133 Primary open-angle glaucoma, bilateral, severe stage: Secondary | ICD-10-CM | POA: Diagnosis not present

## 2020-11-22 ENCOUNTER — Telehealth: Payer: Self-pay | Admitting: *Deleted

## 2020-11-22 NOTE — Telephone Encounter (Signed)
Patient has cancelled appointment for Friday Dec. 3rd, please call to reschedule at [phone].

## 2020-11-25 ENCOUNTER — Ambulatory Visit: Payer: Medicare PPO | Admitting: Podiatry

## 2021-01-30 ENCOUNTER — Other Ambulatory Visit: Payer: Self-pay

## 2021-01-31 ENCOUNTER — Telehealth: Payer: Self-pay | Admitting: Cardiology

## 2021-01-31 ENCOUNTER — Encounter: Payer: Self-pay | Admitting: Internal Medicine

## 2021-01-31 ENCOUNTER — Ambulatory Visit: Payer: Medicare PPO | Admitting: Internal Medicine

## 2021-01-31 ENCOUNTER — Ambulatory Visit (HOSPITAL_BASED_OUTPATIENT_CLINIC_OR_DEPARTMENT_OTHER)
Admission: RE | Admit: 2021-01-31 | Discharge: 2021-01-31 | Disposition: A | Discharge status: Home / Self Care | Payer: Medicare PPO | Source: Ambulatory Visit | Attending: Internal Medicine | Admitting: Internal Medicine

## 2021-01-31 ENCOUNTER — Other Ambulatory Visit: Payer: Self-pay

## 2021-01-31 VITALS — BP 147/73 | HR 71 | Temp 97.9°F | Resp 18 | Ht 69.0 in | Wt 200.5 lb

## 2021-01-31 DIAGNOSIS — Z09 Encounter for follow-up examination after completed treatment for conditions other than malignant neoplasm: Secondary | ICD-10-CM | POA: Diagnosis not present

## 2021-01-31 DIAGNOSIS — R079 Chest pain, unspecified: Secondary | ICD-10-CM | POA: Diagnosis not present

## 2021-01-31 DIAGNOSIS — R739 Hyperglycemia, unspecified: Secondary | ICD-10-CM

## 2021-01-31 LAB — CBC WITH DIFFERENTIAL/PLATELET
Basophils Absolute: 0 10*3/uL (ref 0.0–0.1)
Basophils Relative: 0.8 % (ref 0.0–3.0)
Eosinophils Absolute: 0.2 10*3/uL (ref 0.0–0.7)
Eosinophils Relative: 2.9 % (ref 0.0–5.0)
HCT: 45.8 % (ref 39.0–52.0)
Hemoglobin: 15.1 g/dL (ref 13.0–17.0)
Lymphocytes Relative: 24.8 % (ref 12.0–46.0)
Lymphs Abs: 1.4 10*3/uL (ref 0.7–4.0)
MCHC: 33 g/dL (ref 30.0–36.0)
MCV: 87.5 fl (ref 78.0–100.0)
Monocytes Absolute: 0.7 10*3/uL (ref 0.1–1.0)
Monocytes Relative: 13.3 % — ABNORMAL HIGH (ref 3.0–12.0)
Neutro Abs: 3.3 10*3/uL (ref 1.4–7.7)
Neutrophils Relative %: 58.2 % (ref 43.0–77.0)
Platelets: 77 10*3/uL — ABNORMAL LOW (ref 150.0–400.0)
RBC: 5.23 Mil/uL (ref 4.22–5.81)
RDW: 13.6 % (ref 11.5–15.5)
WBC: 5.6 10*3/uL (ref 4.0–10.5)

## 2021-01-31 LAB — HEMOGLOBIN A1C: Hgb A1c MFr Bld: 5.1 % (ref 4.6–6.5)

## 2021-01-31 LAB — COMPREHENSIVE METABOLIC PANEL
ALT: 18 U/L (ref 0–53)
AST: 16 U/L (ref 0–37)
Albumin: 4.2 g/dL (ref 3.5–5.2)
Alkaline Phosphatase: 78 U/L (ref 39–117)
BUN: 17 mg/dL (ref 6–23)
CO2: 28 mEq/L (ref 19–32)
Calcium: 9.6 mg/dL (ref 8.4–10.5)
Chloride: 104 mEq/L (ref 96–112)
Creatinine, Ser: 1.19 mg/dL (ref 0.40–1.50)
GFR: 57.12 mL/min — ABNORMAL LOW (ref >60.00)
Glucose, Bld: 123 mg/dL — ABNORMAL HIGH (ref 70–99)
Potassium: 4.2 mEq/L (ref 3.5–5.1)
Sodium: 139 mEq/L (ref 135–145)
Total Bilirubin: 0.6 mg/dL (ref 0.2–1.2)
Total Protein: 7.4 g/dL (ref 6.0–8.3)

## 2021-01-31 LAB — TROPONIN I (HIGH SENSITIVITY): High Sens Troponin I: 6 ng/L (ref 2–17)

## 2021-01-31 MED ORDER — NITROGLYCERIN 0.4 MG SL SUBL: 0.4000 mg | SUBLINGUAL_TABLET | SUBLINGUAL | 2 refills | Status: DC | PRN

## 2021-01-31 NOTE — Patient Instructions (Addendum)
Start taking aspirin 81 mg 1 tablet every day If you have chest pain: Put nitroglycerin under your tongue and see if that works. You can repeat 2 additional times. If the pain is severe or not better after the third nitroglycerin, call [phone] Cardiology will see you this week. GO TO THE LAB : Get the blood work    STOP BY THE FIRST FLOOR:  get the XR

## 2021-01-31 NOTE — Progress Notes (Signed)
Pre visit review using our clinic review tool, if applicable. No additional management support is needed unless otherwise documented below in the visit note. 

## 2021-01-31 NOTE — Telephone Encounter (Signed)
Pt is scheduled new pt appt with Dr. Gasper Sells for 02/06/21 at 0900.  Pt made aware of appt date and time by [facility] Scheduling.

## 2021-01-31 NOTE — Telephone Encounter (Signed)
Kayla from [facility] states Dr. Larose Kells is requesting to speak with DOD.

## 2021-01-31 NOTE — Progress Notes (Signed)
Subjective:    Patient ID: Darren Barnes, male    DOB: [birth year], 80 y.o.   MRN: [id number]  DOS:  01/31/2021 Type of visit - description: Acute  Here with his wife Symptoms a started 2 weeks ago. Having left-sided chest pain with no radiation. No associated nausea, diaphoresis, shortness of breath or palpitations. It happens at rest. Up until  2 weeks ago he used to go on the treadmill but decided not to exercise much because of the new onset of CP. The chest pain spontaneously resolves 5 minutes after he take an aspirin. No associated with food.  No chest pain today.  BP today slightly elevated, at home ranges from 120-1 40.    Review of Systems See above Also denies any rash No recent airplane trips or prolonged car trip No leg pain or swelling   Past Medical History:  Diagnosis Date   Cataract    ED (erectile dysfunction)    Glaucoma    both eyes   Hx of adenomatous colonic polyps 07/30/2017   Hyperlipidemia    Hypertension    Left inguinal hernia    Nocturia    saw urology 2012, was eval, rx vesicare (after several other meds failed)   Osteoarthritis    Sleep apnea    no cpap used could not tolerate, mild osa per sleep study   Thrombocytopenia ([place])    saw hematology, observe    Past Surgical History:  Procedure Laterality Date   anal polyp resection  2008   Dr Debbora Presto   Benign tumor from Left arm  yrs ago   colonscopy  08/01/2020   polyps removed   EYE SURGERY  02/22/2020   right eye sx for glaucoma   INGUINAL HERNIA REPAIR Left 09/01/2020   Procedure: OPEN LEFT INGUINAL HERNIA REPAIR WITH MESH;  Surgeon: Coralie Keens, MD;  Location: [facility];  Service: General;  Laterality: Left;   KNEE ARTHROSCOPY  2004   Sinus surgery for benign tumor  2000   stem cell injections to both knees  2017    Allergies as of 01/31/2021   No Known Allergies     Medication List       Accurate as of January 31, 2021 11:59 PM. If  you have any questions, ask your nurse or doctor.        STOP taking these medications   cephALEXin 250 MG capsule Commonly known as: KEFLEX Stopped by: Kathlene November, MD   ciclopirox 8 % solution Commonly known as: PENLAC Stopped by: Kathlene November, MD     TAKE these medications   Alphagan P 0.1 % Soln Generic drug: brimonidine Apply to eye 3 (three) times daily. Left eye   aspirin EC 81 MG tablet Take 81 mg by mouth daily. Swallow whole.   diltiazem 240 MG 24 hr capsule Commonly known as: CARDIZEM CD Take 1 capsule (240 mg total) by mouth in the morning and at bedtime. What changed:   when to take this  additional instructions   dorzolamide-timolol 22.3-6.8 MG/ML ophthalmic solution Commonly known as: COSOPT Place 1 drop into both eyes 2 (two) times daily.   multivitamin per tablet Take 1 tablet by mouth daily.   nitroGLYCERIN 0.4 MG SL tablet Commonly known as: NITROSTAT Place 1 tablet (0.4 mg total) under the tongue every 5 (five) minutes x 3 doses as needed for chest pain. Started by: Kathlene November, MD   Rocklatan 0.02-0.005 % Soln Generic drug: Netarsudil-Latanoprost Apply to eye at  bedtime. Left eye   tamsulosin 0.4 MG Caps capsule Commonly known as: FLOMAX Take 1 capsule (0.4 mg total) by mouth in the morning and at bedtime. What changed: when to take this   telmisartan 40 MG tablet Commonly known as: MICARDIS Take 1 tablet (40 mg total) by mouth daily.   traMADol 50 MG tablet Commonly known as: ULTRAM Take 1 tablet (50 mg total) by mouth every 6 (six) hours as needed for moderate pain or severe pain.          Objective:   Physical Exam BP (!) 147/73 (BP Location: Left Arm, Patient Position: Sitting, Cuff Size: Normal)    Pulse 71    Temp 97.9 F (36.6 C) (Oral)    Resp 18    Ht 5\' 9"  (1.753 m)    Wt 200 lb 8 oz (90.9 kg)    SpO2 98%    BMI 29.61 kg/m  General:   Well developed, NAD, BMI noted.  HEENT:  Normocephalic . Face symmetric, atraumatic Lungs:   CTA B Normal respiratory effort, no intercostal retractions, no accessory muscle use. Heart: RRR,  no murmur. Chest wall: No TTP Abdomen:  Not distended, soft, non-tender. No rebound or rigidity.   Skin: Not pale. Not jaundice Lower extremities: no pretibial edema bilaterally  Neurologic:  alert & oriented X3.  Speech normal, gait appropriate for age and unassisted Psych--  Cognition and judgment appear intact.  Cooperative with normal attention span and concentration.  Behavior appropriate. No anxious or depressed appearing.     Assessment      Assessment   HTN- on Cardizem for a while, added losartan 10-2015 Hyperlipidemia (remote history of aches with Lipitor) ED DJD- s/p knee injections (flexogenic) ~ 2016; I signed a parking permit OSA : not using  CPAP as off 10-2017 Glaucoma Abdominal wall mass see physical exam 05-25-2016 H/o Thrombocytopenia, saw hematology, rx  observation GU: --H/o LUTS- Nocturia  Saw  Urologist ~2012 underwent full workup >> dx BPH and detrusor instability, failed several medications --Re-eval by urology:12-2016 Recurrence cystitis, chronic prostatitis  PLAN:  Chest pain: Darren Barnes is 80 year old male, PMH includes hypertension, hyperlipidemia diet-controlled ( last LDL 113), untreated sleep apnea, quit tobacco back in the 22s, + FH CAD father MI at age 29 complaint of chest pain as described above. Some typical and atypical features. EKG today NSR, unchanged from previous. He is chest pain-free today, chest pain-free at the office. D/w pt: ER versus outpatient treatment, he definitely prefers outpatient treatment. Plan: CMP, CBC, troponin stat Chest x-ray Aspirin daily, NTG as needed, Rx sent. At some point he took sildenafil, do not mix that with NTG. ER if consistent pain. Above discussed with cardiology DOD, she thinks is a reasonable plan, appreciate her help.  They will see the patient this week. Patient and his wife feel very comfortable  with this plan    Time spent 45 minutes: Taking a detailed history, coordinating his care, explaining in detail plan of care.  This visit occurred during the SARS-CoV-2 public health emergency.  Safety protocols were in place, including screening questions prior to the visit, additional usage of staff PPE, and extensive cleaning of exam room while observing appropriate contact time as indicated for disinfecting solutions.

## 2021-01-31 NOTE — Telephone Encounter (Signed)
Attempted to call the pt myself to arrange new pt appt with our office, but he did not answer and no vm capability.  Scheduling to continue following up with the pt.  Dr. Gasper Sells has several new pt appt slots open for this week.

## 2021-01-31 NOTE — Telephone Encounter (Signed)
Dr. Meda Coffee DOD spoke with Dr. Larose Kells about this pt.  Per Dr. Meda Coffee, this pt will need to be seen as a new pt by any Provider  this week, for atypical chest pain. Will send a message to Scheduling dept to call the pt back and arrange new pt appt.

## 2021-02-01 NOTE — Assessment & Plan Note (Signed)
Chest pain: Darren Barnes is 80 year old male, PMH includes hypertension, hyperlipidemia diet-controlled ( last LDL 113), untreated sleep apnea, quit tobacco back in the 72s, + FH CAD father MI at age 5 complaint of chest pain as described above. Some typical and atypical features. EKG today NSR, unchanged from previous. He is chest pain-free today, chest pain-free at the office. D/w pt: ER versus outpatient treatment, he definitely prefers outpatient treatment. Plan: CMP, CBC, troponin stat Chest x-ray Aspirin daily, NTG as needed, Rx sent. At some point he took sildenafil, do not mix that with NTG. ER if consistent pain. Above discussed with cardiology DOD, she thinks is a reasonable plan, appreciate her help.  They will see the patient this week. Patient and his wife feel very comfortable with this plan

## 2021-02-06 ENCOUNTER — Ambulatory Visit: Payer: Medicare PPO | Admitting: Internal Medicine

## 2021-02-06 ENCOUNTER — Other Ambulatory Visit: Payer: Self-pay

## 2021-02-06 ENCOUNTER — Encounter: Payer: Self-pay | Admitting: Internal Medicine

## 2021-02-06 VITALS — BP 116/42 | HR 58 | Ht 69.0 in | Wt 201.8 lb

## 2021-02-06 DIAGNOSIS — R072 Precordial pain: Secondary | ICD-10-CM | POA: Insufficient documentation

## 2021-02-06 DIAGNOSIS — E7849 Other hyperlipidemia: Secondary | ICD-10-CM | POA: Insufficient documentation

## 2021-02-06 DIAGNOSIS — G4733 Obstructive sleep apnea (adult) (pediatric): Secondary | ICD-10-CM

## 2021-02-06 DIAGNOSIS — I10 Essential (primary) hypertension: Secondary | ICD-10-CM | POA: Diagnosis not present

## 2021-02-06 MED ORDER — METOPROLOL TARTRATE 50 MG PO TABS: ORAL_TABLET | ORAL | 0 refills | Status: DC

## 2021-02-06 NOTE — Patient Instructions (Addendum)
Medication Instructions:  Your physician recommends that you continue on your current medications as directed. Please refer to the Current Medication list given to you today.  *If you need a refill on your cardiac medications before your next appointment, please call your pharmacy*   Lab Work: NONE If you have labs (blood work) drawn today and your tests are completely normal, you will receive your results only by: Marland Kitchen MyChart Message (if you have MyChart) OR . A paper copy in the mail If you have any lab test that is abnormal or we need to change your treatment, we will call you to review the results.   Testing/Procedures: Your physician has requested that you have a Cardiac CT.    Follow-Up: At [facility], you and your health needs are our priority.  As part of our continuing mission to provide you with exceptional heart care, we have created designated Provider Care Teams.  These Care Teams include your primary Cardiologist (physician) and Advanced Practice Providers (APPs -  Physician Assistants and Nurse Practitioners) who all work together to provide you with the care you need, when you need it.  We recommend signing up for the patient portal called "MyChart".  Sign up information is provided on this After Visit Summary.  MyChart is used to connect with patients for Virtual Visits (Telemedicine).  Patients are able to view lab/test results, encounter notes, upcoming appointments, etc.  Non-urgent messages can be sent to your provider as well.   To learn more about what you can do with MyChart, go to [email or web address].    Your next appointment:   3 month(s)  The format for your next appointment:   In Person  Provider:   You may see Gasper Sells, MD or one of the following Advanced Practice Providers on your designated Care Team:    Melina Copa, PA-C  Ermalinda Barrios, PA-C    Other Instructions  Your cardiac CT will be scheduled at one of the below locations:    [facility] [address] [place], [place] [address] [phone]    Please arrive at the [facility] main entrance (entrance A) of [facility] 30 minutes prior to test start time. Proceed to the [facility] Radiology Department (first floor) to check-in and test prep.   Please follow these instructions carefully (unless otherwise directed):  Hold all erectile dysfunction medications at least 3 days (72 hrs) prior to test.  On the Night Before the Test: . Be sure to Drink plenty of water. . Do not consume any caffeinated/decaffeinated beverages or chocolate 12 hours prior to your test. . Do not take any antihistamines 12 hours prior to your test.   On the Day of the Test: . Drink plenty of water until 1 hour prior to the test. . Do not eat any food 4 hours prior to the test. . You may take your regular medications prior to the test.   . Take metoprolol (Lopressor) 50mg  two hours prior to test.**       After the Test: . Drink plenty of water. . After receiving IV contrast, you may experience a mild flushed feeling. This is normal. . On occasion, you may experience a mild rash up to 24 hours after the test. This is not dangerous. If this occurs, you can take Benadryl 25 mg and increase your fluid intake. . If you experience trouble breathing, this can be serious. If it is severe call 911 IMMEDIATELY. If it is mild, please call  our office.    Once we have confirmed authorization from your insurance company, we will call you to set up a date and time for your test. Based on how quickly your insurance processes prior authorizations requests, please allow up to 4 weeks to be contacted for scheduling your Cardiac CT appointment. Be advised that routine Cardiac CT appointments could be scheduled as many as 8 weeks after your provider has ordered it.  For non-scheduling related questions, please contact the cardiac imaging nurse navigator should you have any  questions/concerns: Marchia Bond, Cardiac Imaging Nurse Navigator Gordy Clement, Cardiac Imaging Nurse Navigator [facility] Direct Office Dial: [phone]   For scheduling needs, including cancellations and rescheduling, please call [place], [phone].

## 2021-02-06 NOTE — Progress Notes (Signed)
Cardiology Office Note:    Date:  02/06/2021   ID:  Darren Barnes, DOB 05-10-1941, MRN [id number]  PCP:  Werner Lean, MD   [place]  Cardiologist:  Rudean Haskell MD Advanced Practice Provider:  No care team member to display Electrophysiologist:  None       CC: Chest pain Consulted for the evaluation of chest pain at the behest of [name], [place] A, MD  History of Present Illness:    Darren Barnes is a 80 y.o. male with a hx of HTN, Dyslipidemia, OSA in the past without treatment, who presents for evaluation 02/06/21.  Patient notes that he is feeling OK now. Had two weeks of chest pain in his chest  (sternum).  No radiation.  Chest felt uncomfortable- cannot describe the sensation but something did not feel right.   Mild discomfort- but has never had this before.  Discomfort occurs with randomly in the day or the night, no triggering events prior to two weeks ago, and improves with ASA.  Patient exertion notable for doing 20 minutes on the treadmill daily right up until the chest pain and feels no symptoms.  In summer does yardwork with no issues.No shortness of breath, DOE.  No PND or orthopnea.  No bendopnea, weight gain, leg swelling , or abdominal swelling.  No syncope or near syncope . Notes  no palpitations or funny heart beats.     Saw his PC MD for these spells- since then has had no chest pain.  Has needed no nitroglycerin.  Patient reports NO prior cardiac testing including  echo,  stress test,  heart catheterizations,  cardioversion,  ablations.  Ambulatory BP 140/70 last month.   Past Medical History:  Diagnosis Date  . Cataract   . ED (erectile dysfunction)   . Glaucoma    both eyes  . Hx of adenomatous colonic polyps 07/30/2017  . Hyperlipidemia   . Hypertension   . Left inguinal hernia   . Nocturia    saw urology 2012, was eval, rx vesicare (after several other meds failed)  . Osteoarthritis   . Sleep apnea    no  cpap used could not tolerate, mild osa per sleep study  . Thrombocytopenia ([place])    saw hematology, observe    Past Surgical History:  Procedure Laterality Date  . anal polyp resection  2008   Dr Debbora Presto  . Benign tumor from Left arm  yrs ago  . colonscopy  08/01/2020   polyps removed  . EYE SURGERY  02/22/2020   right eye sx for glaucoma  . INGUINAL HERNIA REPAIR Left 09/01/2020   Procedure: OPEN LEFT INGUINAL HERNIA REPAIR WITH MESH;  Surgeon: Coralie Keens, MD;  Location: [facility];  Service: General;  Laterality: Left;  . KNEE ARTHROSCOPY  2004  . Sinus surgery for benign tumor  2000  . stem cell injections to both knees  2017    Current Medications: Current Meds  Medication Sig  . aspirin EC 81 MG tablet Take 81 mg by mouth daily. Swallow whole.  . diltiazem (CARDIZEM CD) 240 MG 24 hr capsule Take 1 capsule (240 mg total) by mouth in the morning and at bedtime.  . dorzolamide-timolol (COSOPT) 22.3-6.8 MG/ML ophthalmic solution Place 1 drop into both eyes 2 (two) times daily.  . metoprolol tartrate (LOPRESSOR) 50 MG tablet Take 2 hours prior to Cardiac CT  . multivitamin (THERAGRAN) per tablet Take 1 tablet by mouth daily.  . Netarsudil-Latanoprost (  ROCKLATAN) 0.02-0.005 % SOLN Apply to eye at bedtime. Left eye  . nitroGLYCERIN (NITROSTAT) 0.4 MG SL tablet Place 1 tablet (0.4 mg total) under the tongue every 5 (five) minutes x 3 doses as needed for chest pain.  . tamsulosin (FLOMAX) 0.4 MG CAPS capsule Take 1 capsule (0.4 mg total) by mouth in the morning and at bedtime.  Marland Kitchen telmisartan (MICARDIS) 40 MG tablet Take 1 tablet (40 mg total) by mouth daily.     Allergies:   Patient has no known allergies.   Social History   Socioeconomic History  . Marital status: Married    Spouse name: Not on file  . Number of children: 3  . Years of education: Not on file  . Highest education level: Not on file  Occupational History  . Occupation: Retired [employer] [place]: retired  Tobacco Use  . Smoking status: Former Smoker    Packs/day: 0.50    Years: 5.00    Pack years: 2.50    Quit date: 12/24/1958    Years since quitting: 62.1  . Smokeless tobacco: Never Used  Vaping Use  . Vaping Use: Never used  Substance and Sexual Activity  . Alcohol use: Yes    Comment: socially   . Drug use: No  . Sexual activity: Not on file  Other Topics Concern  . Not on file  Social History Narrative   Lives w/ wife   Lost one son, 2 living children, 8 g-kids            Social Determinants of Health   Financial Resource Strain: Not on file  Food Insecurity: Not on file  Transportation Needs: Not on file  Physical Activity: Not on file  Stress: Not on file  Social Connections: Not on file     Family History: The patient's family history includes Coronary artery disease (age of onset: 42) in his father; Diabetes in his mother; Hypertension in his mother; Stroke (age of onset: 27) in his mother. There is no history of Colon cancer, Prostate cancer, Esophageal cancer, Pancreatic cancer, Rectal cancer, or Stomach cancer. History of coronary artery disease notable for father MI at 35. History of heart failure notable for no members. History of arrhythmia notable for no member.   ROS:   Please see the history of present illness.     All other systems reviewed and are negative.  EKGs/Labs/Other Studies Reviewed:    The following studies were reviewed today:  EKG:   01/31/21: SR rate 70 WNL   Recent Labs: 05/27/2020: TSH 1.74 01/31/2021: ALT 18; BUN 17; Creatinine, Ser 1.19; Hemoglobin 15.1; Platelets 77.0; Potassium 4.2; Sodium 139  Recent Lipid Panel    Component Value Date/Time   CHOL 172 06/21/2020 0929   TRIG 67.0 06/21/2020 0929   TRIG 76 11/12/2006 0824   HDL 45.40 06/21/2020 0929   CHOLHDL 4 06/21/2020 0929   VLDL 13.4 06/21/2020 0929   LDLCALC 113 (H) 06/21/2020 0929   LDLDIRECT 129.5 05/14/2013 0915     Risk  Assessment/Calculations:     N/A  Physical Exam:    VS:  BP (!) 116/42   Pulse (!) 58   Ht 5\' 9"  (1.753 m)   Wt 201 lb 12.8 oz (91.5 kg)   SpO2 97%   BMI 29.80 kg/m     Wt Readings from Last 3 Encounters:  02/06/21 201 lb 12.8 oz (91.5 kg)  01/31/21 200 lb 8 oz (90.9 kg)  09/01/20 187  lb 8 oz (85 kg)    GEN:  Well nourished, well developed in no acute distress HEENT: Normal NECK: No JVD; No carotid bruits LYMPHATICS: No lymphadenopathy CARDIAC: RRR, no murmurs, rubs, gallops RESPIRATORY:  Clear to auscultation without rales, wheezing or rhonchi  ABDOMEN: Soft, non-tender, non-distended MUSCULOSKELETAL:  No edema; No deformity  SKIN: Warm and dry NEUROLOGIC:  Alert and oriented x 3 PSYCHIATRIC:  Normal affect   ASSESSMENT:    1. Precordial pain    PLAN:    In order of problems listed above:  Precordial chest pain HLD - The patient presents with possibly cardiac symptoms - Additional Blood Work:  Lipids above goal in 05/2020 - Continue ASA 81 mg QD, - Sublingual nitroglycerin as need for chest pain.  - Would recommend CCTA with possible FFR as needed to exclude obstructive CAD and to assess for non-obstructive CAD requiring secondary prevention - based on results, may add statin   Essential Hypertension OSA - ambulatory blood pressure 140/70, will continue ambulatory BP monitoring; gave education on how to perform ambulatory blood pressure monitoring including the frequency and technique; goal ambulatory blood pressure < 135/85 on average; normal BP today - continue home medications  - discussed diet (DASH/low sodium), and exercise/weight loss interventions  Three months follow up unless new symptoms or abnormal test results warranting change in plan  Would be reasonable for  APP Follow up   Medication Adjustments/Labs and Tests Ordered: Current medicines are reviewed at length with the patient today.  Concerns regarding medicines are outlined above.  Orders  Placed This Encounter  Procedures  . CT CORONARY MORPH W/CTA COR W/SCORE W/CA W/CM &/OR WO/CM  . CT CORONARY FRACTIONAL FLOW RESERVE DATA PREP  . CT CORONARY FRACTIONAL FLOW RESERVE FLUID ANALYSIS  . Basic metabolic panel   Meds ordered this encounter  Medications  . metoprolol tartrate (LOPRESSOR) 50 MG tablet    Sig: Take 2 hours prior to Cardiac CT    Dispense:  1 tablet    Refill:  0    Patient Instructions  Medication Instructions:  Your physician recommends that you continue on your current medications as directed. Please refer to the Current Medication list given to you today.  *If you need a refill on your cardiac medications before your next appointment, please call your pharmacy*   Lab Work: NONE If you have labs (blood work) drawn today and your tests are completely normal, you will receive your results only by: Marland Kitchen MyChart Message (if you have MyChart) OR . A paper copy in the mail If you have any lab test that is abnormal or we need to change your treatment, we will call you to review the results.   Testing/Procedures: Your physician has requested that you have a Cardiac CT.    Follow-Up: At [facility], you and your health needs are our priority.  As part of our continuing mission to provide you with exceptional heart care, we have created designated Provider Care Teams.  These Care Teams include your primary Cardiologist (physician) and Advanced Practice Providers (APPs -  Physician Assistants and Nurse Practitioners) who all work together to provide you with the care you need, when you need it.  We recommend signing up for the patient portal called "MyChart".  Sign up information is provided on this After Visit Summary.  MyChart is used to connect with patients for Virtual Visits (Telemedicine).  Patients are able to view lab/test results, encounter notes, upcoming appointments, etc.  Non-urgent messages can  be sent to your provider as well.   To learn more  about what you can do with MyChart, go to [email or web address].    Your next appointment:   3 month(s)  The format for your next appointment:   In Person  Provider:   You may see Gasper Sells, MD or one of the following Advanced Practice Providers on your designated Care Team:    Melina Copa, PA-C  Ermalinda Barrios, PA-C    Other Instructions  Your cardiac CT will be scheduled at one of the below locations:   [facility] [address] [place], [place] [address] [phone]    Please arrive at the [facility] main entrance (entrance A) of [facility] 30 minutes prior to test start time. Proceed to the [facility] Radiology Department (first floor) to check-in and test prep.   Please follow these instructions carefully (unless otherwise directed):  Hold all erectile dysfunction medications at least 3 days (72 hrs) prior to test.  On the Night Before the Test: . Be sure to Drink plenty of water. . Do not consume any caffeinated/decaffeinated beverages or chocolate 12 hours prior to your test. . Do not take any antihistamines 12 hours prior to your test.   On the Day of the Test: . Drink plenty of water until 1 hour prior to the test. . Do not eat any food 4 hours prior to the test. . You may take your regular medications prior to the test.   . Take metoprolol (Lopressor) 50mg  two hours prior to test.**       After the Test: . Drink plenty of water. . After receiving IV contrast, you may experience a mild flushed feeling. This is normal. . On occasion, you may experience a mild rash up to 24 hours after the test. This is not dangerous. If this occurs, you can take Benadryl 25 mg and increase your fluid intake. . If you experience trouble breathing, this can be serious. If it is severe call 911 IMMEDIATELY. If it is mild, please call our office.    Once we have confirmed authorization from your insurance company, we will call you to set up a  date and time for your test. Based on how quickly your insurance processes prior authorizations requests, please allow up to 4 weeks to be contacted for scheduling your Cardiac CT appointment. Be advised that routine Cardiac CT appointments could be scheduled as many as 8 weeks after your provider has ordered it.  For non-scheduling related questions, please contact the cardiac imaging nurse navigator should you have any questions/concerns: Marchia Bond, Cardiac Imaging Nurse Navigator Gordy Clement, Cardiac Imaging Nurse Navigator [facility] Direct Office Dial: [phone]   For scheduling needs, including cancellations and rescheduling, please call [place], [phone].       Signed, Werner Lean, MD  02/06/2021 9:58 AM    [place]

## 2021-02-14 DIAGNOSIS — H401133 Primary open-angle glaucoma, bilateral, severe stage: Secondary | ICD-10-CM | POA: Diagnosis not present

## 2021-02-17 ENCOUNTER — Other Ambulatory Visit: Payer: Medicare PPO | Admitting: *Deleted

## 2021-02-17 DIAGNOSIS — R072 Precordial pain: Secondary | ICD-10-CM

## 2021-02-18 LAB — BASIC METABOLIC PANEL
BUN/Creatinine Ratio: 14 (ref 10–24)
BUN: 16 mg/dL (ref 8–27)
CO2: 22 mmol/L (ref 20–29)
Calcium: 9.4 mg/dL (ref 8.6–10.2)
Chloride: 103 mmol/L (ref 96–106)
Creatinine, Ser: 1.14 mg/dL (ref 0.76–1.27)
GFR calc Af Amer: 69 mL/min/{1.73_m2} (ref >59)
GFR calc non Af Amer: 60 mL/min/{1.73_m2} (ref >59)
Glucose: 82 mg/dL (ref 65–99)
Potassium: 4.4 mmol/L (ref 3.5–5.2)
Sodium: 139 mmol/L (ref 134–144)

## 2021-02-21 ENCOUNTER — Telehealth (HOSPITAL_COMMUNITY): Payer: Self-pay | Admitting: Emergency Medicine

## 2021-02-21 ENCOUNTER — Telehealth (HOSPITAL_COMMUNITY): Payer: Self-pay | Admitting: *Deleted

## 2021-02-21 NOTE — Telephone Encounter (Signed)
Patient returning call regarding upcoming cardiac imaging study; pt verbalizes understanding of appt date/time, parking situation and where to check in, pre-test NPO status and medications ordered, and verified current allergies; name and call back number provided for further questions should they arise  Luisa Louk RN Navigator Cardiac Imaging [facility] [phone] office [phone] cell   

## 2021-02-21 NOTE — Telephone Encounter (Signed)
Attempted to call patient regarding upcoming cardiac CT appointment. °Left message on voicemail with name and callback number °Marycarmen Hagey RN Navigator Cardiac Imaging °[facility] °[phone] Office °[phone] Cell ° °

## 2021-02-23 ENCOUNTER — Encounter (HOSPITAL_COMMUNITY): Payer: Self-pay

## 2021-02-23 ENCOUNTER — Ambulatory Visit (HOSPITAL_COMMUNITY)
Admission: RE | Admit: 2021-02-23 | Discharge: 2021-02-23 | Disposition: A | Discharge status: Home / Self Care | Payer: Medicare PPO | Source: Ambulatory Visit | Attending: Internal Medicine | Admitting: Internal Medicine

## 2021-02-23 ENCOUNTER — Other Ambulatory Visit: Payer: Self-pay

## 2021-02-23 DIAGNOSIS — J9811 Atelectasis: Secondary | ICD-10-CM | POA: Insufficient documentation

## 2021-02-23 DIAGNOSIS — R931 Abnormal findings on diagnostic imaging of heart and coronary circulation: Secondary | ICD-10-CM | POA: Insufficient documentation

## 2021-02-23 DIAGNOSIS — R072 Precordial pain: Secondary | ICD-10-CM | POA: Diagnosis not present

## 2021-02-23 DIAGNOSIS — I2510 Atherosclerotic heart disease of native coronary artery without angina pectoris: Secondary | ICD-10-CM | POA: Diagnosis not present

## 2021-02-23 DIAGNOSIS — I700 Atherosclerosis of aorta: Secondary | ICD-10-CM | POA: Diagnosis not present

## 2021-02-23 IMAGING — CT CT HEART MORP W/ CTA COR W/ SCORE W/ CA W/CM &/OR W/O CM
1 series · 5 of 15 positions shown, 7 images · non-contrast
Comparison: None.
COMPARISON: None.

Addendum:
EXAM:
OVER-READ INTERPRETATION  CT CHEST

The following report is an over-read performed by radiologist Dr.
Rajesh Mcnulty [REDACTED] on 02/23/2021. This over-read
does not include interpretation of cardiac or coronary anatomy or
pathology. The coronary CTA interpretation by the cardiologist is
attached.
CLINICAL DATA: 79 Year-old African American Male
Cardiac/Coronary  CTA
TECHNIQUE: The patient was scanned on a Phillips Force scanner.

[Series 973: — · 0.45mm/px · 5 of 15 slices shown, 7 images]
[im 3/15  vessel]
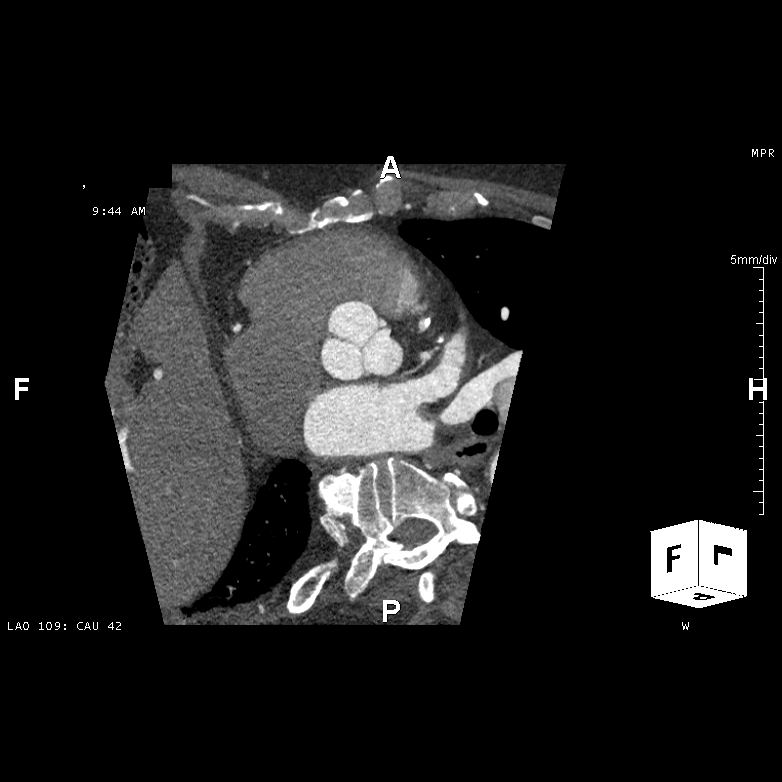
[im 3/15  lung]
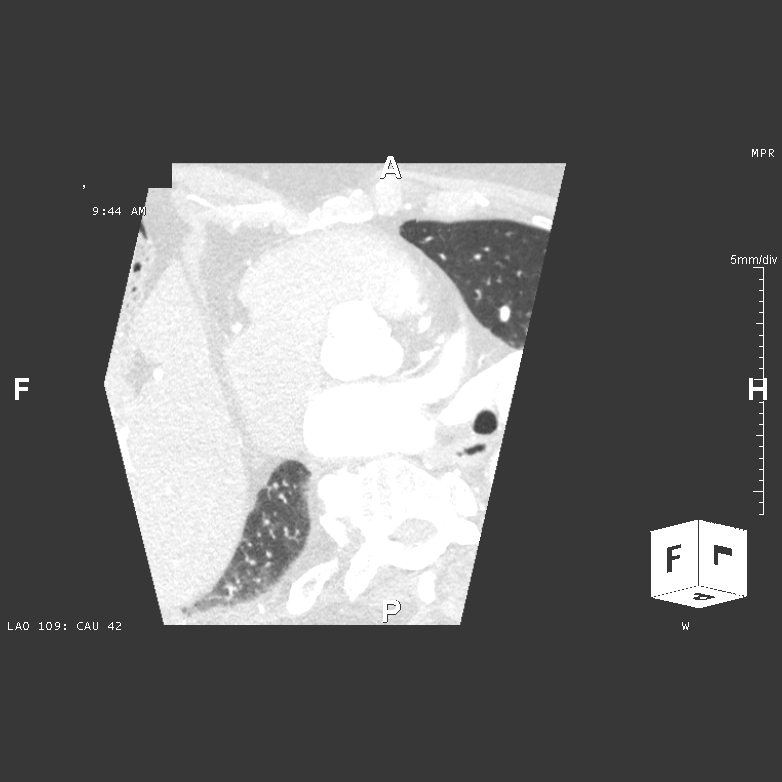
[im 5/15  vessel]
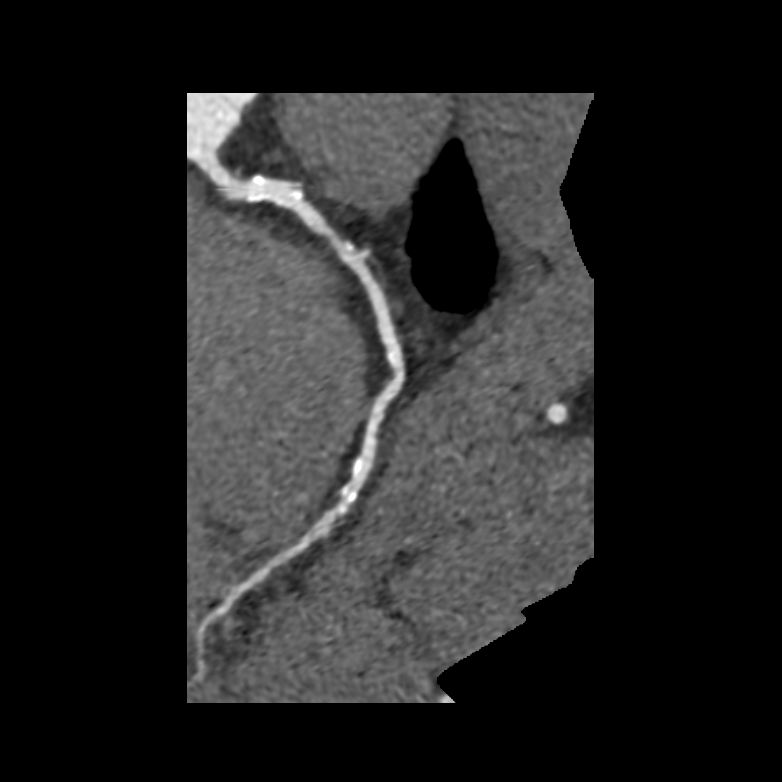
[im 8/15  vessel]
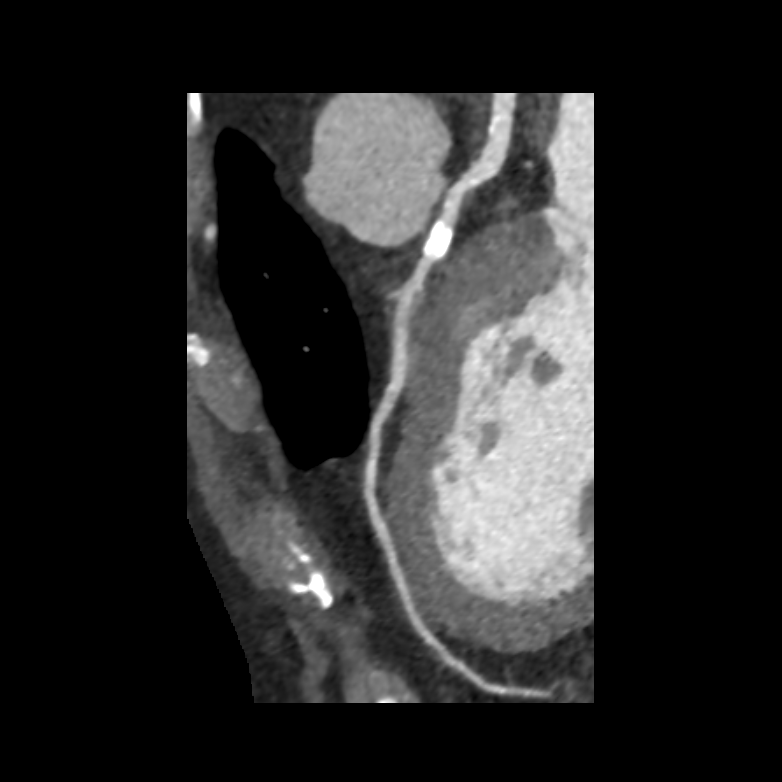
[im 11/15  vessel]
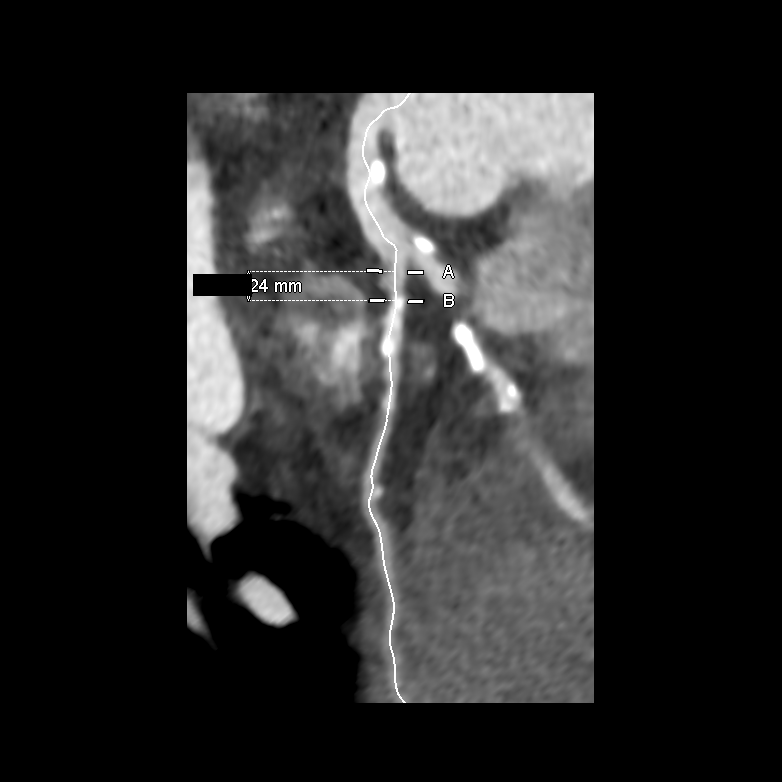
[im 13/15  vessel]
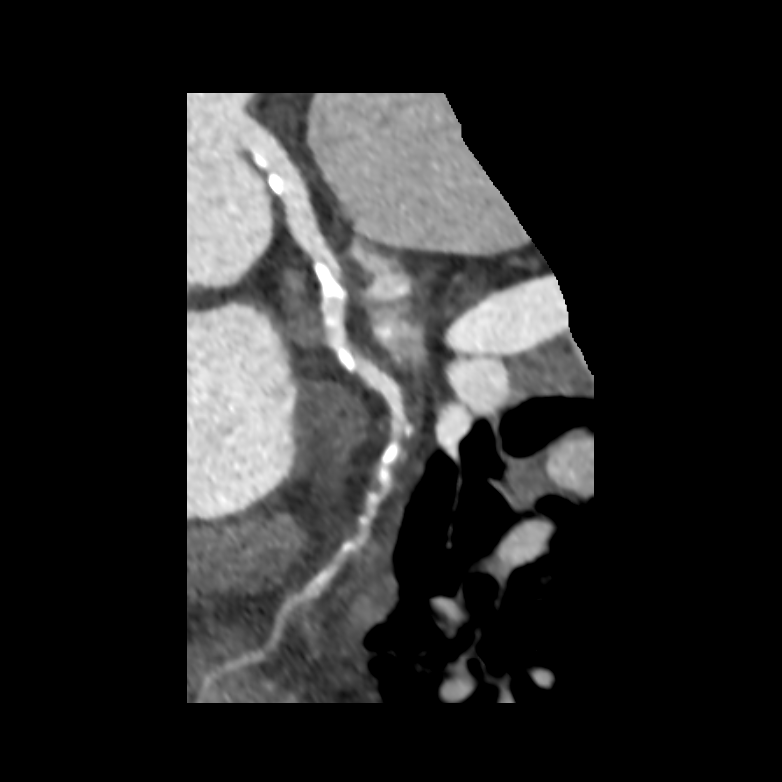
[im 13/15  lung]
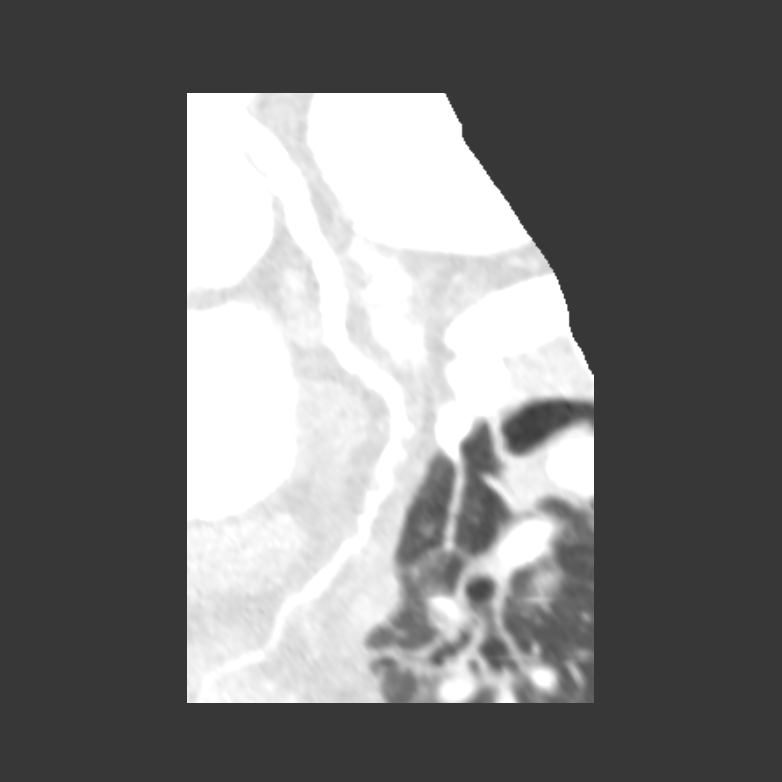

[5 of 15 positions shown; findings below may reference images not displayed]

FINDINGS: Vascular: Heart is normal size. Aorta normal caliber measuring up to
32 mm in the ascending thoracic aorta. Scattered calcifications in
the descending thoracic aorta.

Mediastinum/Nodes: No adenopathy

Lungs/Pleura: Dependent atelectasis in the lower lobes. No confluent
opacities or effusions.

Upper Abdomen: Calcifications throughout the spleen compatible with
old granulomatous disease. No acute findings in the upper abdomen.

Musculoskeletal: Chest wall soft tissues are unremarkable. No acute
bony abnormality.
IMPRESSION: Minimal dependent atelectasis crash that dependent atelectasis in
the lower lobes.

Descending aortic atherosclerosis.

Old granulomatous disease in the spleen.

No acute findings.
FINDINGS: A 100 kV prospective scan was triggered in the descending thoracic
aorta at 111 HU's. Axial non-contrast 3 mm slices were carried out
through the heart. The data set was analyzed on a dedicated work
station and scored using the Agatson method. Gantry rotation speed
was 250 msecs and collimation was .6 mm. No beta blockade and 0.8 mg
of sl NTG was given. The 3D data set was reconstructed in 5%
intervals of the 67-82 % of the R-R cycle. Diastolic phases were
analyzed on a dedicated work station using MPR, MIP and VRT modes.
The patient received 80 cc of contrast.

Aorta:  Normal size.  Aortic atherosclerosis noted.  No dissection.

Aortic Valve:  Tri-leaflet.  No calcifications.

Coronary Arteries:  Normal coronary origin.  Right dominance.

Coronary calcium score of 885. This was 82nd percentile for age,
sex, and race matched control.

RCA is a large dominant artery that gives rise to PDA and PLA. There
is a mild non-obstructive (25-49%) calcified plaque in the proximal
vessel. There is a moderate stenosis (50-70%) soft plaque in the mid
vessel. There are tandem mild non-obstructive (25-49%) calcified
plaques in the distal vessel.

Left main is a large artery that gives rise to LAD and LCX arteries.
There are tandem calcified plaques (< 10% and 20%) in the distal
left main.

LAD is a large vessel that gives rise to one large high D1 Branch
and one small D2 vessel. There is a mild non-obstructive (25-49%)
calcified plaque in the proximal vessel. There is a mild
non-obstructive (25-49%) calcified plaque in the mid vessel with a
napkin ring's sign (high risk feature).There is a mild
non-obstructive (25-49%) calcified plaque in the distal vessel.
There are mild non-obstructive (25-49%) calcified plaques in both
the ostium of the D1 vessel and the mid D1 vessel. The Glagov
phenomenon is noted.

LCX is a non-dominant artery that bifurcates to one OM1 branch.
There is a moderate stenosis (50-70%) mixed plaque in the proximal
vessel. There is a mild non-obstructive (25-49%) calcified plaque in
the mid vessel with a napkin ring's sign (high risk feature),
followed by moderate stenosis (50-70%) mixed plaque (lesion length
19 mm).

Other findings:

Normal pulmonary vein drainage into the left atrium.

Normal left atrial appendage without a thrombus.

Normal size of the pulmonary artery.

Extra-cardiac findings: See attached radiology report for
non-cardiac structures.
IMPRESSION: 1. Coronary calcium score of 885. This was 82nd percentile for age,
sex, and race matched control.

2. Normal coronary origin.  Right dominance.

3. CAD-RADS 3. Moderate stenosis. Consider symptom-guided
anti-ischemic pharmacotherapy as well as risk factor modification
per guideline directed care. Additional analysis with CT FFR will be
submitted.

4. Aortic atherosclerosis noted.

*** End of Addendum ***
EXAM:
OVER-READ INTERPRETATION  CT CHEST

The following report is an over-read performed by radiologist Dr.
Rajesh Mcnulty [REDACTED] on 02/23/2021. This over-read
does not include interpretation of cardiac or coronary anatomy or
pathology. The coronary CTA interpretation by the cardiologist is
attached.
FINDINGS: Vascular: Heart is normal size. Aorta normal caliber measuring up to
32 mm in the ascending thoracic aorta. Scattered calcifications in
the descending thoracic aorta.

Mediastinum/Nodes: No adenopathy

Lungs/Pleura: Dependent atelectasis in the lower lobes. No confluent
opacities or effusions.

Upper Abdomen: Calcifications throughout the spleen compatible with
old granulomatous disease. No acute findings in the upper abdomen.

Musculoskeletal: Chest wall soft tissues are unremarkable. No acute
bony abnormality.
IMPRESSION: Minimal dependent atelectasis crash that dependent atelectasis in
the lower lobes.

Descending aortic atherosclerosis.

Old granulomatous disease in the spleen.

No acute findings.

## 2021-02-23 MED ORDER — NITROGLYCERIN 0.4 MG SL SUBL: 0.8000 mg | SUBLINGUAL_TABLET | Freq: Once | SUBLINGUAL | Status: AC

## 2021-02-23 MED ORDER — IOHEXOL 350 MG/ML SOLN
80.0000 mL | Freq: Once | INTRAVENOUS | Status: AC | PRN
  Administered 2021-02-23: 80 mL via INTRAVENOUS

## 2021-02-23 MED ORDER — NITROGLYCERIN 0.4 MG SL SUBL
SUBLINGUAL_TABLET | SUBLINGUAL | Status: AC
  Administered 2021-02-23: 0.8 mg via SUBLINGUAL
  Filled 2021-02-23: qty 2

## 2021-03-02 ENCOUNTER — Encounter: Payer: Self-pay | Admitting: Internal Medicine

## 2021-03-02 ENCOUNTER — Other Ambulatory Visit: Payer: Self-pay

## 2021-03-02 ENCOUNTER — Ambulatory Visit: Payer: Medicare PPO | Admitting: Internal Medicine

## 2021-03-02 VITALS — BP 130/60 | HR 53 | Ht 69.0 in | Wt 198.0 lb

## 2021-03-02 DIAGNOSIS — I10 Essential (primary) hypertension: Secondary | ICD-10-CM | POA: Diagnosis not present

## 2021-03-02 DIAGNOSIS — G4733 Obstructive sleep apnea (adult) (pediatric): Secondary | ICD-10-CM

## 2021-03-02 DIAGNOSIS — I700 Atherosclerosis of aorta: Secondary | ICD-10-CM

## 2021-03-02 DIAGNOSIS — E7849 Other hyperlipidemia: Secondary | ICD-10-CM

## 2021-03-02 DIAGNOSIS — R011 Cardiac murmur, unspecified: Secondary | ICD-10-CM | POA: Diagnosis not present

## 2021-03-02 DIAGNOSIS — R072 Precordial pain: Secondary | ICD-10-CM

## 2021-03-02 MED ORDER — ISOSORBIDE MONONITRATE ER 30 MG PO TB24: 15.0000 mg | ORAL_TABLET | Freq: Every day | ORAL | 3 refills | Status: DC

## 2021-03-02 MED ORDER — ROSUVASTATIN CALCIUM 10 MG PO TABS: 10.0000 mg | ORAL_TABLET | Freq: Every day | ORAL | 3 refills | Status: DC

## 2021-03-02 NOTE — Progress Notes (Signed)
Cardiology Office Note:    Date:  03/02/2021   ID:  Darren Barnes, DOB [birth year], MRN [id number]  PCP:  Werner Lean, MD   [place]  Cardiologist:  Rudean Haskell MD Advanced Practice Provider:  No care team member to display Electrophysiologist:  None   CC: CCTA Follow up  History of Present Illness:    Darren Barnes is a 80 y.o. male with a hx of HTN, Dyslipidemia, OSA in the past without treatment, who presents for evaluation 02/06/21.  In interim had positive CCTA.   Patient notes that he is doing fine.  Since last visit notes  changes.  Relevant interval testing or therapy include the CCTA.  There are no interval hospital/ED visit.    No chest pain or pressure.  No SOB/DOE and no PND/Orthopnea.  No weight gain or leg swelling.  No palpitations or syncope .  Has not been back on the treadmill since.  Used to PRN Nitro.  Ambulatory blood pressure 11/53 and heart rate 66:  Log reviewed.   Past Medical History:  Diagnosis Date  . Cataract   . ED (erectile dysfunction)   . Glaucoma    both eyes  . Hx of adenomatous colonic polyps 07/30/2017  . Hyperlipidemia   . Hypertension   . Left inguinal hernia   . Nocturia    saw urology 2012, was eval, rx vesicare (after several other meds failed)  . Osteoarthritis   . Sleep apnea    no cpap used could not tolerate, mild osa per sleep study  . Thrombocytopenia ([place])    saw hematology, observe    Past Surgical History:  Procedure Laterality Date  . anal polyp resection  2008   Dr Debbora Presto  . Benign tumor from Left arm  yrs ago  . colonscopy  08/01/2020   polyps removed  . EYE SURGERY  02/22/2020   right eye sx for glaucoma  . INGUINAL HERNIA REPAIR Left 09/01/2020   Procedure: OPEN LEFT INGUINAL HERNIA REPAIR WITH MESH;  Surgeon: Coralie Keens, MD;  Location: [facility];  Service: General;  Laterality: Left;  . KNEE ARTHROSCOPY  2004  . Sinus surgery for benign tumor   2000  . stem cell injections to both knees  2017    Current Medications: Current Meds  Medication Sig  . aspirin EC 81 MG tablet Take 81 mg by mouth daily. Swallow whole.  . diltiazem (CARDIZEM CD) 240 MG 24 hr capsule Take 1 capsule (240 mg total) by mouth in the morning and at bedtime.  . dorzolamide-timolol (COSOPT) 22.3-6.8 MG/ML ophthalmic solution Place 1 drop into both eyes 2 (two) times daily.  . isosorbide mononitrate (IMDUR) 30 MG 24 hr tablet Take 0.5 tablets (15 mg total) by mouth daily.  . multivitamin (THERAGRAN) per tablet Take 1 tablet by mouth daily.  . Netarsudil-Latanoprost (ROCKLATAN) 0.02-0.005 % SOLN Apply to eye at bedtime. Left eye  . nitroGLYCERIN (NITROSTAT) 0.4 MG SL tablet Place 1 tablet (0.4 mg total) under the tongue every 5 (five) minutes x 3 doses as needed for chest pain.  . rosuvastatin (CRESTOR) 10 MG tablet Take 1 tablet (10 mg total) by mouth daily.  . tamsulosin (FLOMAX) 0.4 MG CAPS capsule Take 1 capsule (0.4 mg total) by mouth in the morning and at bedtime.  Marland Kitchen telmisartan (MICARDIS) 40 MG tablet Take 1 tablet (40 mg total) by mouth daily.     Allergies:   Patient has no known allergies.  Social History   Socioeconomic History  . Marital status: Married    Spouse name: Not on file  . Number of children: 3  . Years of education: Not on file  . Highest education level: Not on file  Occupational History  . Occupation: Retired [employer] [place]: retired  Tobacco Use  . Smoking status: Former Smoker    Packs/day: 0.50    Years: 5.00    Pack years: 2.50    Quit date: 12/24/1958    Years since quitting: 62.2  . Smokeless tobacco: Never Used  Vaping Use  . Vaping Use: Never used  Substance and Sexual Activity  . Alcohol use: Yes    Comment: socially   . Drug use: No  . Sexual activity: Not on file  Other Topics Concern  . Not on file  Social History Narrative   Lives w/ wife   Lost one son, 2 living children, 8 g-kids             Social Determinants of Health   Financial Resource Strain: Not on file  Food Insecurity: Not on file  Transportation Needs: Not on file  Physical Activity: Not on file  Stress: Not on file  Social Connections: Not on file     Family History: The patient's family history includes Coronary artery disease (age of onset: 59) in his father; Diabetes in his mother; Hypertension in his mother; Stroke (age of onset: 2) in his mother. There is no history of Colon cancer, Prostate cancer, Esophageal cancer, Pancreatic cancer, Rectal cancer, or Stomach cancer. History of coronary artery disease notable for father MI at 35. History of heart failure notable for no members. History of arrhythmia notable for no member.  ROS:   Please see the history of present illness.     All other systems reviewed and are negative.  EKGs/Labs/Other Studies Reviewed:    The following studies were reviewed today:  EKG:   03/02/21: Sinus Bradycardia with 1st HB rate 252;  01/31/21: SR rate 70 WNL   Cardiac CT : Date:02/23/2021 Results: Aorta:  Normal size.  Aortic atherosclerosis noted.  No dissection.  Aortic Valve:  Tri-leaflet.  No calcifications.  Coronary Arteries:  Normal coronary origin.  Right dominance.  Coronary calcium score of 885. This was 82nd percentile for age, sex, and race matched control.  RCA is a large dominant artery that gives rise to PDA and PLA. There is a mild non-obstructive (25-49%) calcified plaque in the proximal vessel. There is a moderate stenosis (50-70%) soft plaque in the mid vessel. There are tandem mild non-obstructive (25-49%) calcified plaques in the distal vessel.  Left main is a large artery that gives rise to LAD and LCX arteries. There are tandem calcified plaques (< 10% and 20%) in the distal left main.  LAD is a large vessel that gives rise to one large high D1 Branch and one small D2 vessel. There is a mild non-obstructive (25-49%) calcified  plaque in the proximal vessel. There is a mild non-obstructive (25-49%) calcified plaque in the mid vessel with a napkin ring's sign (high risk feature).There is a mild non-obstructive (25-49%) calcified plaque in the distal vessel. There are mild non-obstructive (25-49%) calcified plaques in both the ostium of the D1 vessel and the mid D1 vessel. The Glagov phenomenon is noted.  LCX is a non-dominant artery that bifurcates to one OM1 branch. There is a moderate stenosis (50-70%) mixed plaque in the proximal vessel. There is a  mild non-obstructive (25-49%) calcified plaque in the mid vessel with a napkin ring's sign (high risk feature), followed by moderate stenosis (50-70%) mixed plaque (lesion length 19 mm).   IMPRESSION: Minimal dependent atelectasis crash that dependent atelectasis in the lower lobes.  Descending aortic atherosclerosis.  Old granulomatous disease in the spleen.  No acute findings.  1. Left Main: No significant functional stenosis, CT-FFR 0.97.  2. LAD: No significant functional stenosis, CT-FFR 0.87 at the proximal D1, CT-FFR 0.87 in the mid LAD, but significant functional stenosis in the distal LAD, CT-FFR 0.75. 3. LCX: Significant functional stenosis in the distal LCX, CT-FFR 0.73. 4. RCA: Possibly significant functional stenosis in the mid RCA, CT-FFR 0.82; significant functional stenosis in the distal RCA CT-FFR 0.63.  IMPRESSION: 1. CT FFR analysis shows multiple lesions of significance or possible significance: Mid RCA, distal RCA, Distal LAD, Distal LCX.  Recent Labs: 05/27/2020: TSH 1.74 01/31/2021: ALT 18; Hemoglobin 15.1; Platelets 77.0 02/17/2021: BUN 16; Creatinine, Ser 1.14; Potassium 4.4; Sodium 139  Recent Lipid Panel    Component Value Date/Time   CHOL 172 06/21/2020 0929   TRIG 67.0 06/21/2020 0929   TRIG 76 11/12/2006 0824   HDL 45.40 06/21/2020 0929   CHOLHDL 4 06/21/2020 0929   VLDL 13.4 06/21/2020 0929   LDLCALC 113 (H)  06/21/2020 0929   LDLDIRECT 129.5 05/14/2013 0915    Risk Assessment/Calculations:     N/A  Physical Exam:    VS:  BP 130/60   Pulse (!) 53   Ht 5\' 9"  (1.753 m)   Wt 198 lb (89.8 kg)   SpO2 98%   BMI 29.24 kg/m     Wt Readings from Last 3 Encounters:  03/02/21 198 lb (89.8 kg)  02/06/21 201 lb 12.8 oz (91.5 kg)  01/31/21 200 lb 8 oz (90.9 kg)    GEN:  Well nourished, well developed in no acute distress HEENT: Normal NECK: No JVD; No carotid bruits LYMPHATICS: No lymphadenopathy CARDIAC: RRR, II/VI Systolic crescendo; no rubs, gallops  +2 Radial RESPIRATORY:  Clear to auscultation without rales, wheezing or rhonchi  ABDOMEN: Soft, non-tender, non-distended MUSCULOSKELETAL:  No edema; No deformity  SKIN: Warm and dry NEUROLOGIC:  Alert and oriented x 3 PSYCHIATRIC:  Normal affect   ASSESSMENT:    1. Heart murmur, systolic   2. Precordial chest pain   3. Other hyperlipidemia   4. Aortic atherosclerosis ([place])   5. Essential hypertension   6. OSA (obstructive sleep apnea)    PLAN:    In order of problems listed above:  Positive Stress Test Aortic Atherscerlosis - SHARED DECISION MAKING:  Reviewed imaging with patient and discussed LHC vs medical management; without LM disease and with angina on the treadmill.  Patient deferred LHC and would like to try medication and cath if he has angina on the treadmill despite medication - continue diltiazem; adding Imdur 15 mg Po daily; adding rosuvastatin 10 mg and lipids and LFTs in three months - Continue ASA 81 mg QD,  Sublingual nitroglycerin as need for chest pain.  - Risks and benefits of cardiac catheterization have been discussed with the patient.  These include bleeding, infection, kidney damage, stroke, heart attack, death.  The patient understands these risks and is willing to proceed only if medication do not worse  Essential Hypertension OSA - ambulatory blood pressure 140/70, will continue ambulatory BP  monitoring; gave education on how to perform ambulatory blood pressure monitoring including the frequency and technique; goal ambulatory blood pressure < 135/85  on average; normal BP today - continue home medications  - discussed diet (DASH/low sodium), and exercise/weight loss interventions  Three months follow up unless new symptoms or abnormal test results warranting change in plan; then yearly if no new angina  New systolic murmur- will get echo  Would be reasonable for  APP Follow up  Time Spent Directly with Patient:   I have spent a total of 40 minutes with the patient, reviewing  notes, imaging, and examining the patient as well as establishing an assessment and plan that was discussed personally with the patient.  > 50% of time was spent in direct patient care reviewing CT with patient and making decisions about procedures (possible cath)   Medication Adjustments/Labs and Tests Ordered: Current medicines are reviewed at length with the patient today.  Concerns regarding medicines are outlined above.  Orders Placed This Encounter  Procedures  . Hepatic function panel  . Lipid panel  . EKG 12-Lead  . ECHOCARDIOGRAM COMPLETE   Meds ordered this encounter  Medications  . rosuvastatin (CRESTOR) 10 MG tablet    Sig: Take 1 tablet (10 mg total) by mouth daily.    Dispense:  90 tablet    Refill:  3  . isosorbide mononitrate (IMDUR) 30 MG 24 hr tablet    Sig: Take 0.5 tablets (15 mg total) by mouth daily.    Dispense:  45 tablet    Refill:  3    Patient Instructions  Medication Instructions:  Your physician has recommended you make the following change in your medication:  START: isosorbide mononitrate (Imdur) 15 mg by mouth daily START: rosuvastatin (Crestor) 10 mg by mouth daily   *If you need a refill on your cardiac medications before your next appointment, please call your pharmacy*   Lab Work: IN 3 MONTHS: LFT and Fasting lipid panel  If you have labs (blood  work) drawn today and your tests are completely normal, you will receive your results only by: Marland Kitchen MyChart Message (if you have MyChart) OR . A paper copy in the mail If you have any lab test that is abnormal or we need to change your treatment, we will call you to review the results.   Testing/Procedures: Your physician has requested that you have an echocardiogram. Echocardiography is a painless test that uses sound waves to create images of your heart. It provides your doctor with information about the size and shape of your heart and how well your heart's chambers and valves are working. This procedure takes approximately one hour. There are no restrictions for this procedure.     Follow-Up: At [facility], you and your health needs are our priority.  As part of our continuing mission to provide you with exceptional heart care, we have created designated Provider Care Teams.  These Care Teams include your primary Cardiologist (physician) and Advanced Practice Providers (APPs -  Physician Assistants and Nurse Practitioners) who all work together to provide you with the care you need, when you need it.   Your next appointment:   3 month(s)  The format for your next appointment:   In Person  Provider:   You may see Gasper Sells, MD or one of the following Advanced Practice Providers on your designated Care Team:    Melina Copa, PA-C  Ermalinda Barrios, PA-C          Signed, Werner Lean, MD  03/02/2021 12:23 PM    [place]

## 2021-03-02 NOTE — Patient Instructions (Signed)
Medication Instructions:  Your physician has recommended you make the following change in your medication:  START: isosorbide mononitrate (Imdur) 15 mg by mouth daily START: rosuvastatin (Crestor) 10 mg by mouth daily   *If you need a refill on your cardiac medications before your next appointment, please call your pharmacy*   Lab Work: IN 3 MONTHS: LFT and Fasting lipid panel  If you have labs (blood work) drawn today and your tests are completely normal, you will receive your results only by: Marland Kitchen MyChart Message (if you have MyChart) OR . A paper copy in the mail If you have any lab test that is abnormal or we need to change your treatment, we will call you to review the results.   Testing/Procedures: Your physician has requested that you have an echocardiogram. Echocardiography is a painless test that uses sound waves to create images of your heart. It provides your doctor with information about the size and shape of your heart and how well your heart's chambers and valves are working. This procedure takes approximately one hour. There are no restrictions for this procedure.     Follow-Up: At [facility], you and your health needs are our priority.  As part of our continuing mission to provide you with exceptional heart care, we have created designated Provider Care Teams.  These Care Teams include your primary Cardiologist (physician) and Advanced Practice Providers (APPs -  Physician Assistants and Nurse Practitioners) who all work together to provide you with the care you need, when you need it.   Your next appointment:   3 month(s)  The format for your next appointment:   In Person  Provider:   You may see Gasper Sells, MD or one of the following Advanced Practice Providers on your designated Care Team:    Melina Copa, PA-C  Ermalinda Barrios, PA-C

## 2021-03-03 ENCOUNTER — Ambulatory Visit (HOSPITAL_BASED_OUTPATIENT_CLINIC_OR_DEPARTMENT_OTHER)
Admission: RE | Admit: 2021-03-03 | Discharge: 2021-03-03 | Disposition: A | Discharge status: Home / Self Care | Payer: Medicare PPO | Source: Ambulatory Visit | Attending: Internal Medicine | Admitting: Internal Medicine

## 2021-03-03 DIAGNOSIS — R011 Cardiac murmur, unspecified: Secondary | ICD-10-CM | POA: Diagnosis not present

## 2021-03-03 LAB — ECHOCARDIOGRAM COMPLETE
Area-P 1/2: 3.34 cm2
S' Lateral: 3 cm

## 2021-03-17 ENCOUNTER — Other Ambulatory Visit: Payer: Medicare PPO

## 2021-04-11 DIAGNOSIS — Z79899 Other long term (current) drug therapy: Secondary | ICD-10-CM | POA: Diagnosis not present

## 2021-04-11 DIAGNOSIS — H2512 Age-related nuclear cataract, left eye: Secondary | ICD-10-CM | POA: Diagnosis not present

## 2021-04-11 DIAGNOSIS — H401133 Primary open-angle glaucoma, bilateral, severe stage: Secondary | ICD-10-CM | POA: Diagnosis not present

## 2021-04-17 ENCOUNTER — Other Ambulatory Visit: Payer: Self-pay | Admitting: Internal Medicine

## 2021-05-11 ENCOUNTER — Ambulatory Visit: Payer: Medicare PPO | Admitting: Internal Medicine

## 2021-05-11 ENCOUNTER — Encounter: Payer: Self-pay | Admitting: Internal Medicine

## 2021-05-11 ENCOUNTER — Other Ambulatory Visit: Payer: Self-pay

## 2021-05-11 VITALS — BP 140/60 | HR 60 | Ht 69.0 in | Wt 197.0 lb

## 2021-05-11 DIAGNOSIS — I700 Atherosclerosis of aorta: Secondary | ICD-10-CM

## 2021-05-11 DIAGNOSIS — E7849 Other hyperlipidemia: Secondary | ICD-10-CM | POA: Diagnosis not present

## 2021-05-11 DIAGNOSIS — G4733 Obstructive sleep apnea (adult) (pediatric): Secondary | ICD-10-CM

## 2021-05-11 DIAGNOSIS — I2510 Atherosclerotic heart disease of native coronary artery without angina pectoris: Secondary | ICD-10-CM | POA: Diagnosis not present

## 2021-05-11 NOTE — Patient Instructions (Signed)
Medication Instructions:  Your physician recommends that you continue on your current medications as directed. Please refer to the Current Medication list given to you today.  *If you need a refill on your cardiac medications before your next appointment, please call your pharmacy*   Lab Work: TODAY: LFT and lipid panel If you have labs (blood work) drawn today and your tests are completely normal, you will receive your results only by: Marland Kitchen MyChart Message (if you have MyChart) OR . A paper copy in the mail If you have any lab test that is abnormal or we need to change your treatment, we will call you to review the results.   Testing/Procedures: NONE   Follow-Up: At [facility], you and your health needs are our priority.  As part of our continuing mission to provide you with exceptional heart care, we have created designated Provider Care Teams.  These Care Teams include your primary Cardiologist (physician) and Advanced Practice Providers (APPs -  Physician Assistants and Nurse Practitioners) who all work together to provide you with the care you need, when you need it.  We recommend signing up for the patient portal called "MyChart".  Sign up information is provided on this After Visit Summary.  MyChart is used to connect with patients for Virtual Visits (Telemedicine).  Patients are able to view lab/test results, encounter notes, upcoming appointments, etc.  Non-urgent messages can be sent to your provider as well.   To learn more about what you can do with MyChart, go to [email or web address].    Your next appointment:   6 month(s)  The format for your next appointment:   In Person  Provider:   You may see Rudean Haskell, MD or one of the following Advanced Practice Providers on your designated Care Team:    Melina Copa, PA-C  Ermalinda Barrios, PA-C

## 2021-05-11 NOTE — Progress Notes (Signed)
Cardiology Office Note:    Date:  05/11/2021   ID:  [name], DOB [birth year], MRN [id number]  PCP:  Colon Branch, MD   [place]  Cardiologist:  Rudean Haskell MD Advanced Practice Provider:  No care team member to display Electrophysiologist:  None   CC: Chest pain Follow- up  History of Present Illness:    Kelly Eisler is a 80 y.o. male with a hx of HTN, Dyslipidemia, OSA in the past without treatment, who presents for evaluation 02/06/21.  In interim had positive CCTA.  Discussed Cath and that that time planned only for cath if new sx. Seen 05/11/21  Patient notes that he is doing terrific.  Since last visit notes has been working out in the yard.  With activity has no symptoms.  Relevant interval testing or therapy include prep for eye surgery.  There are no interval hospital/ED visit.    No chest pain or pressure.  No SOB/DOE and no PND/Orthopnea.  No weight gain or leg swelling.  No palpitations or syncope.  Ambulatory blood pressure SBP: 111-130.  Past Medical History:  Diagnosis Date  . Cataract   . ED (erectile dysfunction)   . Glaucoma    both eyes  . Hx of adenomatous colonic polyps 07/30/2017  . Hyperlipidemia   . Hypertension   . Left inguinal hernia   . Nocturia    saw urology 2012, was eval, rx vesicare (after several other meds failed)  . Osteoarthritis   . Sleep apnea    no cpap used could not tolerate, mild osa per sleep study  . Thrombocytopenia ([place])    saw hematology, observe    Past Surgical History:  Procedure Laterality Date  . anal polyp resection  2008   Dr Debbora Presto  . Benign tumor from Left arm  yrs ago  . colonscopy  08/01/2020   polyps removed  . EYE SURGERY  02/22/2020   right eye sx for glaucoma  . INGUINAL HERNIA REPAIR Left 09/01/2020   Procedure: OPEN LEFT INGUINAL HERNIA REPAIR WITH MESH;  Surgeon: Coralie Keens, MD;  Location: [facility];  Service: General;  Laterality: Left;  .  KNEE ARTHROSCOPY  2004  . Sinus surgery for benign tumor  2000  . stem cell injections to both knees  2017    Current Medications: Current Meds  Medication Sig  . aspirin EC 81 MG tablet Take 81 mg by mouth daily. Swallow whole.  . diltiazem (CARDIZEM CD) 240 MG 24 hr capsule Take 1 capsule (240 mg total) by mouth in the morning and at bedtime.  . dorzolamide-timolol (COSOPT) 22.3-6.8 MG/ML ophthalmic solution Place 1 drop into both eyes 2 (two) times daily.  . isosorbide mononitrate (IMDUR) 30 MG 24 hr tablet Take 0.5 tablets (15 mg total) by mouth daily.  . multivitamin (THERAGRAN) per tablet Take 1 tablet by mouth daily.  . Netarsudil-Latanoprost (ROCKLATAN) 0.02-0.005 % SOLN Apply to eye at bedtime. Left eye  . nitroGLYCERIN (NITROSTAT) 0.4 MG SL tablet Place 1 tablet (0.4 mg total) under the tongue every 5 (five) minutes x 3 doses as needed for chest pain.  . rosuvastatin (CRESTOR) 10 MG tablet Take 1 tablet (10 mg total) by mouth daily.  . tamsulosin (FLOMAX) 0.4 MG CAPS capsule Take 1 capsule (0.4 mg total) by mouth in the morning and at bedtime.  Marland Kitchen telmisartan (MICARDIS) 40 MG tablet Take 1 tablet (40 mg total) by mouth daily.     Allergies:  Patient has no known allergies.   Social History   Socioeconomic History  . Marital status: Married    Spouse name: Not on file  . Number of children: 3  . Years of education: Not on file  . Highest education level: Not on file  Occupational History  . Occupation: Retired [employer] [place]: retired  Tobacco Use  . Smoking status: Former Smoker    Packs/day: 0.50    Years: 5.00    Pack years: 2.50    Quit date: 12/24/1958    Years since quitting: 62.4  . Smokeless tobacco: Never Used  Vaping Use  . Vaping Use: Never used  Substance and Sexual Activity  . Alcohol use: Yes    Comment: socially   . Drug use: No  . Sexual activity: Not on file  Other Topics Concern  . Not on file  Social History Narrative   Lives w/ wife    Lost one son, 2 living children, 8 g-kids            Social Determinants of Health   Financial Resource Strain: Not on file  Food Insecurity: Not on file  Transportation Needs: Not on file  Physical Activity: Not on file  Stress: Not on file  Social Connections: Not on file     Family History: The patient's family history includes Coronary artery disease (age of onset: 83) in his father; Diabetes in his mother; Hypertension in his mother; Stroke (age of onset: 62) in his mother. There is no history of Colon cancer, Prostate cancer, Esophageal cancer, Pancreatic cancer, Rectal cancer, or Stomach cancer. History of coronary artery disease notable for father MI at 33. History of heart failure notable for no members. History of arrhythmia notable for no member.  ROS:   Please see the history of present illness.     All other systems reviewed and are negative.  EKGs/Labs/Other Studies Reviewed:    The following studies were reviewed today:  EKG:   03/02/21: Sinus Bradycardia with 1st HB rate 252;  01/31/21: SR rate 70 WNL   Transthoracic Echocardiogram: Date: 03/03/21 Results: Aortic Sclerosis suggested 1. Left ventricular ejection fraction, by estimation, is 60 to 65%. The  left ventricle has normal function. The left ventricle has no regional  wall motion abnormalities. Left ventricular diastolic parameters are  consistent with Grade I diastolic  dysfunction (impaired relaxation).  2. Right ventricular systolic function is normal. The right ventricular  size is normal.  3. The mitral valve is normal in structure. No evidence of mitral valve  regurgitation. No evidence of mitral stenosis.  4. The aortic valve is normal in structure. Aortic valve regurgitation is  not visualized. No aortic stenosis is present.  5. The inferior vena cava is normal in size with greater than 50%  respiratory variability, suggesting right atrial pressure of 3 mmHg.   Cardiac CT  : Date:02/23/2021 Results: Aorta:  Normal size.  Aortic atherosclerosis noted.  No dissection.  Aortic Valve:  Tri-leaflet.  No calcifications.  Coronary Arteries:  Normal coronary origin.  Right dominance.  Coronary calcium score of 885. This was 82nd percentile for age, sex, and race matched control.  RCA is a large dominant artery that gives rise to PDA and PLA. There is a mild non-obstructive (25-49%) calcified plaque in the proximal vessel. There is a moderate stenosis (50-70%) soft plaque in the mid vessel. There are tandem mild non-obstructive (25-49%) calcified plaques in the distal vessel.  Left main  is a large artery that gives rise to LAD and LCX arteries. There are tandem calcified plaques (< 10% and 20%) in the distal left main.  LAD is a large vessel that gives rise to one large high D1 Branch and one small D2 vessel. There is a mild non-obstructive (25-49%) calcified plaque in the proximal vessel. There is a mild non-obstructive (25-49%) calcified plaque in the mid vessel with a napkin ring's sign (high risk feature).There is a mild non-obstructive (25-49%) calcified plaque in the distal vessel. There are mild non-obstructive (25-49%) calcified plaques in both the ostium of the D1 vessel and the mid D1 vessel. The Glagov phenomenon is noted.  LCX is a non-dominant artery that bifurcates to one OM1 branch. There is a moderate stenosis (50-70%) mixed plaque in the proximal vessel. There is a mild non-obstructive (25-49%) calcified plaque in the mid vessel with a napkin ring's sign (high risk feature), followed by moderate stenosis (50-70%) mixed plaque (lesion length 19 mm).   IMPRESSION: Minimal dependent atelectasis crash that dependent atelectasis in the lower lobes.  Descending aortic atherosclerosis.  Old granulomatous disease in the spleen.  No acute findings.  1. Left Main: No significant functional stenosis, CT-FFR 0.97.  2. LAD: No  significant functional stenosis, CT-FFR 0.87 at the proximal D1, CT-FFR 0.87 in the mid LAD, but significant functional stenosis in the distal LAD, CT-FFR 0.75. 3. LCX: Significant functional stenosis in the distal LCX, CT-FFR 0.73. 4. RCA: Possibly significant functional stenosis in the mid RCA, CT-FFR 0.82; significant functional stenosis in the distal RCA CT-FFR 0.63.  IMPRESSION: 1. CT FFR analysis shows multiple lesions of significance or possible significance: Mid RCA, distal RCA, Distal LAD, Distal LCX.   Recent Labs: 05/27/2020: TSH 1.74 01/31/2021: ALT 18; Hemoglobin 15.1; Platelets 77.0 02/17/2021: BUN 16; Creatinine, Ser 1.14; Potassium 4.4; Sodium 139  Recent Lipid Panel    Component Value Date/Time   CHOL 172 06/21/2020 0929   TRIG 67.0 06/21/2020 0929   TRIG 76 11/12/2006 0824   HDL 45.40 06/21/2020 0929   CHOLHDL 4 06/21/2020 0929   VLDL 13.4 06/21/2020 0929   LDLCALC 113 (H) 06/21/2020 0929   LDLDIRECT 129.5 05/14/2013 0915    Risk Assessment/Calculations:     N/A  Physical Exam:    VS:  BP 140/60   Pulse 60   Ht 5\' 9"  (1.753 m)   Wt 89.4 kg   SpO2 96%   BMI 29.09 kg/m     Wt Readings from Last 3 Encounters:  05/11/21 89.4 kg  03/02/21 89.8 kg  02/06/21 91.5 kg    GEN:  Well nourished, well developed in no acute distress HEENT: Subtle Frank's Sign NECK: No JVD LYMPHATICS: No lymphadenopathy CARDIAC: RRR, II/VI Systolic crescendo; no rubs, gallops  RESPIRATORY:  Clear to auscultation without rales, wheezing or rhonchi  ABDOMEN: Soft, non-tender, non-distended MUSCULOSKELETAL:  No edema; No deformity  SKIN: Warm and dry NEUROLOGIC:  Alert and oriented x 3; Blind in right eye PSYCHIATRIC:  Normal affect   ASSESSMENT:    1. Coronary artery disease involving native coronary artery of native heart without angina pectoris   2. Other hyperlipidemia   3. Aortic atherosclerosis ([place])   4. OSA (obstructive sleep apnea)    PLAN:    In order of  problems listed above:  CAD Aortic Atherscerlosis HTN OSA Aortic Vavle Sclerosis with murmur; planned for 2023 echo - SHARED DECISION MAKING:  Reviewed imaging with patient and wife; discussed red flag symptoms for ED eval -  asymptomatic - continue ASA 81 mg PO daily - continue diltiazem 240 - continue Imdur 15 mg Po daily;and PRN nitro - Continue rosuvastatin 10 mg PO Daily and lipids and LFTs today - continue telmisartan 40 mg PO Daily - will cancel 06/21/21 appt at patient request - will see in 6 months   Medication Adjustments/Labs and Tests Ordered: Current medicines are reviewed at length with the patient today.  Concerns regarding medicines are outlined above.  Orders Placed This Encounter  Procedures  . Lipid panel  . Hepatic function panel   No orders of the defined types were placed in this encounter.   Patient Instructions  Medication Instructions:  Your physician recommends that you continue on your current medications as directed. Please refer to the Current Medication list given to you today.  *If you need a refill on your cardiac medications before your next appointment, please call your pharmacy*   Lab Work: TODAY: LFT and lipid panel If you have labs (blood work) drawn today and your tests are completely normal, you will receive your results only by: Marland Kitchen MyChart Message (if you have MyChart) OR . A paper copy in the mail If you have any lab test that is abnormal or we need to change your treatment, we will call you to review the results.   Testing/Procedures: NONE   Follow-Up: At [facility], you and your health needs are our priority.  As part of our continuing mission to provide you with exceptional heart care, we have created designated Provider Care Teams.  These Care Teams include your primary Cardiologist (physician) and Advanced Practice Providers (APPs -  Physician Assistants and Nurse Practitioners) who all work together to provide you with  the care you need, when you need it.  We recommend signing up for the patient portal called "MyChart".  Sign up information is provided on this After Visit Summary.  MyChart is used to connect with patients for Virtual Visits (Telemedicine).  Patients are able to view lab/test results, encounter notes, upcoming appointments, etc.  Non-urgent messages can be sent to your provider as well.   To learn more about what you can do with MyChart, go to [email or web address].    Your next appointment:   6 month(s)  The format for your next appointment:   In Person  Provider:   You may see Rudean Haskell, MD or one of the following Advanced Practice Providers on your designated Care Team:    Melina Copa, PA-C  Ermalinda Barrios, PA-C          Signed, Werner Lean, MD  05/11/2021 2:30 PM    [place]

## 2021-05-12 LAB — LIPID PANEL
Chol/HDL Ratio: 2.4 ratio (ref 0.0–5.0)
Cholesterol, Total: 120 mg/dL (ref 100–199)
HDL: 50 mg/dL (ref >39)
LDL Chol Calc (NIH): 56 mg/dL (ref 0–99)
Triglycerides: 65 mg/dL (ref 0–149)
VLDL Cholesterol Cal: 14 mg/dL (ref 5–40)

## 2021-05-12 LAB — HEPATIC FUNCTION PANEL
ALT: 19 IU/L (ref 0–44)
AST: 17 IU/L (ref 0–40)
Albumin: 4.3 g/dL (ref 3.6–4.6)
Alkaline Phosphatase: 89 IU/L (ref 44–121)
Bilirubin Total: 0.4 mg/dL (ref 0.0–1.2)
Bilirubin, Direct: 0.19 mg/dL (ref 0.00–0.40)
Total Protein: 7 g/dL (ref 6.0–8.5)

## 2021-05-31 DIAGNOSIS — H2512 Age-related nuclear cataract, left eye: Secondary | ICD-10-CM | POA: Diagnosis not present

## 2021-05-31 DIAGNOSIS — H401123 Primary open-angle glaucoma, left eye, severe stage: Secondary | ICD-10-CM | POA: Diagnosis not present

## 2021-06-05 ENCOUNTER — Other Ambulatory Visit: Payer: Medicare PPO

## 2021-06-07 ENCOUNTER — Ambulatory Visit: Payer: Medicare PPO | Admitting: Physician Assistant

## 2021-06-07 DIAGNOSIS — H2512 Age-related nuclear cataract, left eye: Secondary | ICD-10-CM | POA: Diagnosis not present

## 2021-06-07 DIAGNOSIS — H401123 Primary open-angle glaucoma, left eye, severe stage: Secondary | ICD-10-CM | POA: Diagnosis not present

## 2021-06-08 DIAGNOSIS — Z79899 Other long term (current) drug therapy: Secondary | ICD-10-CM | POA: Diagnosis not present

## 2021-06-08 DIAGNOSIS — H401133 Primary open-angle glaucoma, bilateral, severe stage: Secondary | ICD-10-CM | POA: Diagnosis not present

## 2021-06-08 DIAGNOSIS — H2512 Age-related nuclear cataract, left eye: Secondary | ICD-10-CM | POA: Diagnosis not present

## 2021-06-08 DIAGNOSIS — Z7952 Long term (current) use of systemic steroids: Secondary | ICD-10-CM | POA: Diagnosis not present

## 2021-06-15 DIAGNOSIS — H401133 Primary open-angle glaucoma, bilateral, severe stage: Secondary | ICD-10-CM | POA: Diagnosis not present

## 2021-06-15 DIAGNOSIS — Z79899 Other long term (current) drug therapy: Secondary | ICD-10-CM | POA: Diagnosis not present

## 2021-06-15 DIAGNOSIS — Z7952 Long term (current) use of systemic steroids: Secondary | ICD-10-CM | POA: Diagnosis not present

## 2021-06-15 DIAGNOSIS — H2512 Age-related nuclear cataract, left eye: Secondary | ICD-10-CM | POA: Diagnosis not present

## 2021-06-21 ENCOUNTER — Ambulatory Visit: Payer: Medicare PPO | Admitting: Physician Assistant

## 2021-06-28 ENCOUNTER — Ambulatory Visit (INDEPENDENT_AMBULATORY_CARE_PROVIDER_SITE_OTHER): Payer: Medicare PPO | Admitting: Internal Medicine

## 2021-06-28 ENCOUNTER — Other Ambulatory Visit: Payer: Self-pay

## 2021-06-28 ENCOUNTER — Encounter: Payer: Self-pay | Admitting: Internal Medicine

## 2021-06-28 VITALS — BP 126/72 | HR 54 | Temp 98.1°F | Resp 18 | Ht 69.0 in | Wt 194.1 lb

## 2021-06-28 DIAGNOSIS — I10 Essential (primary) hypertension: Secondary | ICD-10-CM

## 2021-06-28 DIAGNOSIS — Z23 Encounter for immunization: Secondary | ICD-10-CM

## 2021-06-28 DIAGNOSIS — Z0000 Encounter for general adult medical examination without abnormal findings: Secondary | ICD-10-CM

## 2021-06-28 LAB — BASIC METABOLIC PANEL
BUN: 19 mg/dL (ref 6–23)
CO2: 24 mEq/L (ref 19–32)
Calcium: 9.4 mg/dL (ref 8.4–10.5)
Chloride: 107 mEq/L (ref 96–112)
Creatinine, Ser: 1.15 mg/dL (ref 0.40–1.50)
GFR: 59.34 mL/min — ABNORMAL LOW (ref >60.00)
Glucose, Bld: 101 mg/dL — ABNORMAL HIGH (ref 70–99)
Potassium: 4.5 mEq/L (ref 3.5–5.1)
Sodium: 139 mEq/L (ref 135–145)

## 2021-06-28 NOTE — Patient Instructions (Addendum)
  GO TO THE LAB : Get the blood work     [place], [place] back for  a check up in 6 months    "Living will", "[place] of attorney": Advanced care planning  (If you already have a living will or healthcare power of attorney, please bring the copy to be scanned in your chart.)  Advance care planning is a process that supports adults in  understanding and sharing their preferences regarding future medical care.   The patient's preferences are recorded in documents called Advance Directives.    Advanced directives are completed (and can be modified at any time) while the patient is in full mental capacity.   The documentation should be available at all times to the patient, the family and the healthcare providers.  Bring in a copy to be scanned in your chart is an excellent idea and is recommended   This legal documents direct treatment decision making and/or appoint a surrogate to make the decision if the patient is not capable to do so.    Advance directives can be documented in many types of formats,  documents have names such as:  Lliving will  Durable power of attorney for healthcare (healthcare proxy or healthcare power of attorney)  Combined directives  Physician orders for life-sustaining treatment    More information at:  [email or web address]

## 2021-06-28 NOTE — Progress Notes (Signed)
Subjective:    Patient ID: Darren Barnes, male    DOB: 07/28/1941, 80 y.o.   MRN: [id number]  DOS:  06/28/2021 Type of visit - description: CPX  Here with his wife, feeling well and has no major concerns.  Review of Systems  A 14 point review of systems is negative    Past Medical History:  Diagnosis Date   Cataract    ED (erectile dysfunction)    Glaucoma    both eyes   Hx of adenomatous colonic polyps 07/30/2017   Hyperlipidemia    Hypertension    Left inguinal hernia    Nocturia    saw urology 2012, was eval, rx vesicare (after several other meds failed)   Osteoarthritis    Sleep apnea    no cpap used could not tolerate, mild osa per sleep study   Thrombocytopenia ([place])    saw hematology, observe    Past Surgical History:  Procedure Laterality Date   anal polyp resection  2008   Dr Debbora Presto   Benign tumor from Left arm  yrs ago   colonscopy  08/01/2020   polyps removed   EYE SURGERY  02/22/2020   right eye sx for glaucoma   INGUINAL HERNIA REPAIR Left 09/01/2020   Procedure: OPEN LEFT INGUINAL HERNIA REPAIR WITH MESH;  Surgeon: Coralie Keens, MD;  Location: [facility];  Service: General;  Laterality: Left;   KNEE ARTHROSCOPY  2004   Sinus surgery for benign tumor  2000   stem cell injections to both knees  2017   Social History   Socioeconomic History   Marital status: Married    Spouse name: Not on file   Number of children: 3   Years of education: Not on file   Highest education level: Not on file  Occupational History   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  Tobacco Use   Smoking status: Former    Packs/day: 0.50    Years: 5.00    Pack years: 2.50    Types: Cigarettes    Quit date: 12/24/1958    Years since quitting: 62.5   Smokeless tobacco: Never  Vaping Use   Vaping Use: Never used  Substance and Sexual Activity   Alcohol use: Yes    Comment: socially    Drug use: No   Sexual activity: Not on file  Other Topics  Concern   Not on file  Social History Narrative   Lives w/ wife   Lost one son, 2 living children, 8 g-kids            Social Determinants of Health   Financial Resource Strain: Not on file  Food Insecurity: Not on file  Transportation Needs: Not on file  Physical Activity: Not on file  Stress: Not on file  Social Connections: Not on file  Intimate Partner Violence: Not on file     Allergies as of 06/28/2021   No Known Allergies      Medication List        Accurate as of June 28, 2021 11:59 PM. If you have any questions, ask your nurse or doctor.          aspirin EC 81 MG tablet Take 81 mg by mouth daily. Swallow whole.   brimonidine 0.2 % ophthalmic solution Commonly known as: ALPHAGAN Place 1 drop into both eyes 2 times daily.   diltiazem 240 MG 24 hr capsule Commonly known as: CARDIZEM CD Take 1 capsule (  240 mg total) by mouth in the morning and at bedtime.   dorzolamide-timolol 22.3-6.8 MG/ML ophthalmic solution Commonly known as: COSOPT Place 1 drop into both eyes 2 (two) times daily.   isosorbide mononitrate 30 MG 24 hr tablet Commonly known as: IMDUR Take 0.5 tablets (15 mg total) by mouth daily.   multivitamin per tablet Take 1 tablet by mouth daily.   nitroGLYCERIN 0.4 MG SL tablet Commonly known as: NITROSTAT Place 1 tablet (0.4 mg total) under the tongue every 5 (five) minutes x 3 doses as needed for chest pain.   Rocklatan 0.02-0.005 % Soln Generic drug: Netarsudil-Latanoprost Apply to eye at bedtime. Left eye   rosuvastatin 10 MG tablet Commonly known as: CRESTOR Take 1 tablet (10 mg total) by mouth daily.   tamsulosin 0.4 MG Caps capsule Commonly known as: FLOMAX Take 1 capsule (0.4 mg total) by mouth in the morning and at bedtime.   telmisartan 40 MG tablet Commonly known as: MICARDIS Take 1 tablet (40 mg total) by mouth daily.           Objective:   Physical Exam BP 126/72 (BP Location: Left Arm, Patient Position:  Sitting, Cuff Size: Small)   Pulse (!) 54   Temp 98.1 F (36.7 C) (Oral)   Resp 18   Ht 5\' 9"  (1.753 m)   Wt 194 lb 2 oz (88.1 kg)   SpO2 98%   BMI 28.67 kg/m  General: Well developed, NAD, BMI noted Neck: No  thyromegaly  HEENT:  Normocephalic . Face symmetric, atraumatic Lungs:  CTA B Normal respiratory effort, no intercostal retractions, no accessory muscle use. Heart: RRR,  no murmur.  Abdomen:  Not distended, soft, non-tender. No rebound or rigidity.   Lower extremities: no pretibial edema bilaterally  Skin: Exposed areas without rash. Not pale. Not jaundice Neurologic:  alert & oriented X3.  Speech normal, gait appropriate for age and unassisted Strength symmetric and appropriate for age.  Psych: Cognition and judgment appear intact.  Cooperative with normal attention span and concentration.  Behavior appropriate. No anxious or depressed appearing.     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 Rx medical mgmt   PLAN: Here for CPX HTN: BP is very good, on Cardizem, Imdur, Micardis.  Check BMP Hyperlipidemia: On Crestor, last FLP satisfactory Cardiovascular:Since the last visit he complained of chest pain, saw cardiology, cardiac CT: CT FFR analysis shows multiple lesions of significance  or possible significance: Mid RCA, distal RCA, Distal LAD, Distal LCX. Was Rx medical management.  Asymptomatic. RTC 6 m    This visit occurred during the SARS-CoV-2 public health emergency.  Safety protocols were in place, including screening questions prior to the visit, additional usage of staff PPE, and extensive cleaning of exam room while observing appropriate contact time as indicated for disinfecting solutions.

## 2021-06-29 ENCOUNTER — Encounter: Payer: Self-pay | Admitting: Internal Medicine

## 2021-06-29 NOTE — Assessment & Plan Note (Signed)
-  Td 2015 - PNM 23: 2005, 2010, 06-28-21;  prevnar 2015 - zostavax 2010 - s/p shingrex - s/p covid x 4 -rec flu shot q year --CCS: Cscope 6-08  (Dr Carlean Purl) neg but had a polypoid anal lesion, lesion removed by Dr Bubba Camp; cscope again 07/2017 and per GI letter no recall due to age --prostate ca screening: last PSA wnl, has not seen urology lately, sxs controlled w/ flomax, no further screening     --Height decreased -->   DEXA 05-2014 normal --Diet exercise : counseled --Labs:  BMP - POA d/w pt

## 2021-06-29 NOTE — Assessment & Plan Note (Signed)
Here for CPX HTN: BP is very good, on Cardizem, Imdur, Micardis.  Check BMP Hyperlipidemia: On Crestor, last FLP satisfactory Cardiovascular:Since the last visit he complained of chest pain, saw cardiology, cardiac CT: CT FFR analysis shows multiple lesions of significance or possible significance: Mid RCA, distal RCA, Distal LAD, Distal LCX. Was Rx medical management.  Asymptomatic. RTC 6 m

## 2021-07-10 ENCOUNTER — Other Ambulatory Visit: Payer: Self-pay | Admitting: Internal Medicine

## 2021-07-13 DIAGNOSIS — H401133 Primary open-angle glaucoma, bilateral, severe stage: Secondary | ICD-10-CM | POA: Diagnosis not present

## 2021-07-13 DIAGNOSIS — H2512 Age-related nuclear cataract, left eye: Secondary | ICD-10-CM | POA: Diagnosis not present

## 2021-07-13 DIAGNOSIS — Z79899 Other long term (current) drug therapy: Secondary | ICD-10-CM | POA: Diagnosis not present

## 2021-07-16 ENCOUNTER — Other Ambulatory Visit: Payer: Self-pay | Admitting: Internal Medicine

## 2021-09-16 ENCOUNTER — Ambulatory Visit (INDEPENDENT_AMBULATORY_CARE_PROVIDER_SITE_OTHER): Payer: Medicare PPO

## 2021-09-16 VITALS — Ht 69.0 in | Wt 190.0 lb

## 2021-09-16 DIAGNOSIS — Z0000 Encounter for general adult medical examination without abnormal findings: Secondary | ICD-10-CM | POA: Diagnosis not present

## 2021-09-16 NOTE — Progress Notes (Addendum)
Subjective:   Darren Barnes is a 80 y.o. male who presents for an Initial Medicare Annual Wellness Visit.  Virtual Visit via Telephone Note  I connected with  [name] on 09/16/21 at  8:00 AM EDT by telephone and verified that I am speaking with the correct person using two identifiers.  Location: Patient: Home Provider: [facility] Persons participating in the virtual visit: patient/Nurse Health Advisor   I discussed the limitations, risks, security and privacy concerns of performing an evaluation and management service by telephone and the availability of in person appointments. The patient expressed understanding and agreed to proceed.  Interactive audio and video telecommunications were attempted between this nurse and patient, however failed, due to patient having technical difficulties OR patient did not have access to video capability.  We continued and completed visit with audio only.  Some vital signs may be absent or patient reported.   Maryclare Nydam E Bayan Hedstrom, LPN   Review of Systems     Cardiac Risk Factors include: advanced age (>19men, >41 women);dyslipidemia;male gender;hypertension;sedentary lifestyle;Other (see comment), Risk factor comments: OSA - doesn't use CPAP, atherosclerosis, CAD     Objective:    Today's Vitals   09/16/21 0802  Weight: 190 lb (86.2 kg)  Height: 5\' 9"  (1.753 m)   Body mass index is 28.06 kg/m.  Advanced Directives 09/16/2021 09/01/2020 05/23/2020 02/02/2019 07/11/2017  Does Patient Have a Medical Advance Directive? Yes Yes No No Yes  Type of Paramedic of [place];Living will - - - Living will  Does patient want to make changes to medical advance directive? - No - Patient declined - - -  Copy of [place] in Chart? No - copy requested - - - -  Would patient like information on creating a medical advance directive? - - No - Patient declined - -    Current Medications (verified) Outpatient Encounter  Medications as of 09/16/2021  Medication Sig   hypromellose (GENTEAL) 0.3 % GEL ophthalmic ointment    moxifloxacin (VIGAMOX) 0.5 % ophthalmic solution Place 1 drop into the left eye 4 times daily.   prednisoLONE acetate (PRED FORTE) 1 % ophthalmic suspension Place 1 drop into the left eye 4 times daily.   aspirin EC 81 MG tablet Take 81 mg by mouth daily. Swallow whole.   brimonidine (ALPHAGAN) 0.2 % ophthalmic solution Place 1 drop into both eyes 2 times daily.   diltiazem (CARDIZEM CD) 240 MG 24 hr capsule TAKE 1 CAPSULE(240 MG) BY MOUTH IN THE MORNING AND AT BEDTIME   dorzolamide-timolol (COSOPT) 22.3-6.8 MG/ML ophthalmic solution Place 1 drop into both eyes 2 (two) times daily.   fluticasone (FLONASE) 50 MCG/ACT nasal spray 1 spray by Each Nare route daily as needed for Allergies.   isosorbide mononitrate (IMDUR) 30 MG 24 hr tablet Take 0.5 tablets (15 mg total) by mouth daily.   ketorolac (ACULAR) 0.4 % SOLN Place 1 drop into the left eye 4 times daily.   multivitamin (THERAGRAN) per tablet Take 1 tablet by mouth daily.   Netarsudil-Latanoprost (ROCKLATAN) 0.02-0.005 % SOLN Apply to eye at bedtime. Left eye   nitroGLYCERIN (NITROSTAT) 0.4 MG SL tablet Place 1 tablet (0.4 mg total) under the tongue every 5 (five) minutes x 3 doses as needed for chest pain. (Patient not taking: Reported on 06/28/2021)   Oxymetazoline HCl (NASAL SPRAY) 0.05 % SOLN Place into the nose.   rosuvastatin (CRESTOR) 10 MG tablet Take 1 tablet (10 mg total) by mouth daily.  tamsulosin (FLOMAX) 0.4 MG CAPS capsule Take 1 capsule (0.4 mg total) by mouth in the morning and at bedtime.   telmisartan (MICARDIS) 40 MG tablet TAKE 1 TABLET BY MOUTH EVERY DAY   No facility-administered encounter medications on file as of 09/16/2021.    Allergies (verified) Patient has no known allergies.   History: Past Medical History:  Diagnosis Date   Cataract    ED (erectile dysfunction)    Glaucoma    both eyes   Hx of  adenomatous colonic polyps 07/30/2017   Hyperlipidemia    Hypertension    Left inguinal hernia    Nocturia    saw urology 2012, was eval, rx vesicare (after several other meds failed)   Osteoarthritis    Sleep apnea    no cpap used could not tolerate, mild osa per sleep study   Thrombocytopenia ([place])    saw hematology, observe   Past Surgical History:  Procedure Laterality Date   anal polyp resection  2008   Dr Debbora Presto   Benign tumor from Left arm  yrs ago   colonscopy  08/01/2020   polyps removed   EYE SURGERY  02/22/2020   right eye sx for glaucoma   INGUINAL HERNIA REPAIR Left 09/01/2020   Procedure: OPEN LEFT INGUINAL HERNIA REPAIR WITH MESH;  Surgeon: Coralie Keens, MD;  Location: [facility];  Service: General;  Laterality: Left;   KNEE ARTHROSCOPY  2004   Sinus surgery for benign tumor  2000   stem cell injections to both knees  2017   Family History  Problem Relation Age of Onset   Coronary artery disease Father 43       MI at 76   Hypertension Mother        M and F    Diabetes Mother    Stroke Mother 7   Colon cancer Neg Hx    Prostate cancer Neg Hx    Esophageal cancer Neg Hx    Pancreatic cancer Neg Hx    Rectal cancer Neg Hx    Stomach cancer Neg Hx    Social History   Socioeconomic History   Marital status: Married    Spouse name: Not on file   Number of children: 3   Years of education: Not on file   Highest education level: Not on file  Occupational History   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  Tobacco Use   Smoking status: Former    Packs/day: 0.50    Years: 5.00    Pack years: 2.50    Types: Cigarettes    Quit date: 12/24/1958    Years since quitting: 62.7   Smokeless tobacco: Never  Vaping Use   Vaping Use: Never used  Substance and Sexual Activity   Alcohol use: Yes    Comment: socially    Drug use: No   Sexual activity: Not on file  Other Topics Concern   Not on file  Social History Narrative    Lives w/ wife   19 one son, 2 living children, 8 g-kids            Social Determinants of Health   Financial Resource Strain: Low Risk    Difficulty of Paying Living Expenses: Not hard at all  Food Insecurity: No Food Insecurity   Worried About Charity fundraiser in the Last Year: Never true   [place] in the Last Year: Never true  Transportation Needs: No  Transportation Needs   Lack of Transportation (Medical): No   Lack of Transportation (Non-Medical): No  Physical Activity: Insufficiently Active   Days of Exercise per Week: 7 days   Minutes of Exercise per Session: 10 min  Stress: No Stress Concern Present   Feeling of Stress : Not at all  Social Connections: Socially Integrated   Frequency of Communication with Friends and Family: More than three times a week   Frequency of Social Gatherings with Friends and Family: Twice a week   Attends Religious Services: 1 to 4 times per year   Active Member of [employer] or Organizations: Yes   Attends Archivist Meetings: 1 to 4 times per year   Marital Status: Married    Tobacco Counseling Counseling given: Not Answered   Clinical Intake:  Pre-visit preparation completed: Yes  Pain : No/denies pain     BMI - recorded: 28.06 Nutritional Status: BMI 25 -29 Overweight Nutritional Risks: None Diabetes: No  How often do you need to have someone help you when you read instructions, pamphlets, or other written materials from your doctor or pharmacy?: 1 - Never  Diabetic? No  Interpreter Needed?: No  Information entered by :: Taran Haynesworth, LPN   Activities of Daily Living In your present state of health, do you have any difficulty performing the following activities: 09/16/2021 06/28/2021  Hearing? N N  Vision? N N  Difficulty concentrating or making decisions? N N  Walking or climbing stairs? N N  Dressing or bathing? N N  Doing errands, shopping? N N  Preparing Food and eating ? N -  Using the Toilet? N  -  In the past six months, have you accidently leaked urine? Y -  Comment occassionally -  Do you have problems with loss of bowel control? N -  Managing your Medications? N -  Managing your Finances? N -  Housekeeping or managing your Housekeeping? N -  Some recent data might be hidden    Patient Care Team: Colon Branch, MD as PCP - General (Internal Medicine) Rigoberto Noel, MD as Attending Physician (Pulmonary Disease) Marylynn Pearson, MD as Consulting Physician (Ophthalmology) Renard Hamper Marguerita Merles as Physician Assistant (Dermatology) Kathie Rhodes, MD (Inactive) as Consulting Physician (Urology) Jodi Marble, MD as Consulting Physician (Otolaryngology)  Indicate any recent Medical Services you may have received from other than [facility] providers in the past year (date may be approximate).     Assessment:   This is a routine wellness examination for [place].  Hearing/Vision screen Hearing Screening - Comments:: Denies hearing difficulties  Vision Screening - Comments:: Wears eyeglasses - up to date with annual eye exams with Dr Francee Nodal  Dietary issues and exercise activities discussed: Current Exercise Habits: Home exercise routine, Type of exercise: walking, Time (Minutes): 10, Frequency (Times/Week): 7, Weekly Exercise (Minutes/Week): 70, Intensity: Mild, Exercise limited by: cardiac condition(s)   Goals Addressed             This Visit's Progress    Exercise 3x per week (30 min per time)         Depression Screen PHQ 2/9 Scores 09/16/2021 06/28/2021 01/31/2021 06/21/2020 06/19/2019 05/29/2018 11/26/2016  PHQ - 2 Score 0 0 0 0 0 0 0    Fall Risk Fall Risk  09/16/2021 06/28/2021 01/31/2021 05/27/2020 05/29/2018  Falls in the past year? 0 0 0 0 No  Number falls in past yr: 0 0 0 0 -  Injury with Fall? 0 0 0 0 -  Risk for fall due to : No Fall Risks - - - -  Follow up Falls prevention discussed Falls evaluation completed - Falls evaluation completed -    FALL RISK PREVENTION  PERTAINING TO THE HOME:  Any stairs in or around the home? Yes  If so, are there any without handrails? No  Home free of loose throw rugs in walkways, pet beds, electrical cords, etc? Yes  Adequate lighting in your home to reduce risk of falls? Yes   ASSISTIVE DEVICES UTILIZED TO PREVENT FALLS:  Life alert? No  Use of a cane, walker or w/c? No  Grab bars in the bathroom? Yes  Shower chair or bench in shower? No  Elevated toilet seat or a handicapped toilet? No   TIMED UP AND GO:  Was the test performed? No . Telephonic visit.  Cognitive Function:     6CIT Screen 09/16/2021  What Year? 0 points  What month? 0 points  What time? 0 points  Count back from 20 0 points  Months in reverse 0 points  Repeat phrase 6 points  Total Score 6    Immunizations Immunization History  Administered Date(s) Administered   Influenza Whole 09/23/2012   Influenza, High Dose Seasonal PF 08/24/2013, 09/19/2019   Influenza-Unspecified 10/14/2015, 09/17/2016, 09/20/2017   Moderna Sars-Covid-2 Vaccination 01/04/2020, 02/01/2020, 10/31/2020   Pneumococcal Conjugate-13 05/20/2014, 09/11/2017   Pneumococcal Polysaccharide-23 04/17/2004, 05/03/2009, 06/28/2021   Td 04/17/2004   Tetanus 05/20/2014   Zoster Recombinat (Shingrix) 07/03/2019, 09/03/2019   Zoster, Live 05/10/2009    TDAP status: Up to date  Flu Vaccine status: Due, Education has been provided regarding the importance of this vaccine. Advised may receive this vaccine at local pharmacy or Health Dept. Aware to provide a copy of the vaccination record if obtained from local pharmacy or Health Dept. Verbalized acceptance and understanding.  Pneumococcal vaccine status: Up to date  Covid-19 vaccine status: Completed vaccines  Qualifies for Shingles Vaccine? Yes   Zostavax completed Yes   Shingrix Completed?: Yes  Screening Tests Health Maintenance  Topic Date Due   COVID-19 Vaccine (4 - Booster for Moderna series) 02/28/2021    INFLUENZA VACCINE  07/24/2021   TETANUS/TDAP  05/20/2024   Zoster Vaccines- Shingrix  Completed   HPV VACCINES  Aged Out    Health Maintenance  Health Maintenance Due  Topic Date Due   COVID-19 Vaccine (4 - Booster for Moderna series) 02/28/2021   INFLUENZA VACCINE  07/24/2021    Colorectal cancer screening: No longer required.   Lung Cancer Screening: (Low Dose CT Chest recommended if Age 62-80 years, 30 pack-year currently smoking OR have quit w/in 15years.) does not qualify.   Additional Screening:  Hepatitis C Screening: does not qualify  Vision Screening: Recommended annual ophthalmology exams for early detection of glaucoma and other disorders of the eye. Is the patient up to date with their annual eye exam?  Yes  Who is the provider or what is the name of the office in which the patient attends annual eye exams? [facility] If pt is not established with a provider, would they like to be referred to a provider to establish care? No .   Dental Screening: Recommended annual dental exams for proper oral hygiene  Community Resource Referral / Chronic Care Management: CRR required this visit?  No   CCM required this visit?  No      Plan:     I have personally reviewed and noted the following in the patient's chart:  Medical and social history Use of alcohol, tobacco or illicit drugs  Current medications and supplements including opioid prescriptions. Patient is not currently taking opioid prescriptions. Functional ability and status Nutritional status Physical activity Advanced directives List of other physicians Hospitalizations, surgeries, and ER visits in previous 12 months Vitals Screenings to include cognitive, depression, and falls Referrals and appointments  In addition, I have reviewed and discussed with patient certain preventive protocols, quality metrics, and best practice recommendations. A written personalized care plan for preventive services as  well as general preventive health recommendations were provided to patient.     Sandrea Hammond, LPN   03/14/253   Nurse Notes: 6CIT score = 6   I have reviewed and agree with [facility] documentation.  Kathlene November, MD

## 2021-09-16 NOTE — Patient Instructions (Signed)
Darren Barnes , Thank you for taking time to come for your Medicare Wellness Visit. I appreciate your ongoing commitment to your health goals. Please review the following plan we discussed and let me know if I can assist you in the future.   Screening recommendations/referrals: Colonoscopy: Done 07/01/2020 - No repeat required Recommended yearly ophthalmology/optometry visit for glaucoma screening and checkup Recommended yearly dental visit for hygiene and checkup  Vaccinations: Influenza vaccine: Done 09/19/2019 - Repeat annually Pneumococcal vaccine: Done 09/11/2017 & 06/28/2021 Tdap vaccine: Done 02/20/2014 - Repeat in 10 years Shingles vaccine: Zostavax done 2010; Shingrix done 7/10/220 & 09/03/2019   Covid-19: Done 01/04/2020, 02/21/2020, 10/31/2020, & 02/22/2021  Advanced directives: Please bring a copy of your health care power of attorney and living will to the office to be added to your chart at your convenience.   Conditions/risks identified: Aim for 30 minutes of exercise or brisk walking each day, drink 6-8 glasses of water and eat lots of fruits and vegetables.   Next appointment: Follow up in one year for your annual wellness visit.   Preventive Care 82 Years and Older, Male  Preventive care refers to lifestyle choices and visits with your health care provider that can promote health and wellness. What does preventive care include? A yearly physical exam. This is also called an annual well check. Dental exams once or twice a year. Routine eye exams. Ask your health care provider how often you should have your eyes checked. Personal lifestyle choices, including: Daily care of your teeth and gums. Regular physical activity. Eating a healthy diet. Avoiding tobacco and drug use. Limiting alcohol use. Practicing safe sex. Taking low doses of aspirin every day. Taking vitamin and mineral supplements as recommended by your health care provider. What happens during an annual well  check? The services and screenings done by your health care provider during your annual well check will depend on your age, overall health, lifestyle risk factors, and family history of disease. Counseling  Your health care provider may ask you questions about your: Alcohol use. Tobacco use. Drug use. Emotional well-being. Home and relationship well-being. Sexual activity. Eating habits. History of falls. Memory and ability to understand (cognition). Work and work Statistician. Screening  You may have the following tests or measurements: Height, weight, and BMI. Blood pressure. Lipid and cholesterol levels. These may be checked every 5 years, or more frequently if you are over 21 years old. Skin check. Lung cancer screening. You may have this screening every year starting at age 27 if you have a 30-pack-year history of smoking and currently smoke or have quit within the past 15 years. Fecal occult blood test (FOBT) of the stool. You may have this test every year starting at age 18. Flexible sigmoidoscopy or colonoscopy. You may have a sigmoidoscopy every 5 years or a colonoscopy every 10 years starting at age 60. Prostate cancer screening. Recommendations will vary depending on your family history and other risks. Hepatitis C blood test. Hepatitis B blood test. Sexually transmitted disease (STD) testing. Diabetes screening. This is done by checking your blood sugar (glucose) after you have not eaten for a while (fasting). You may have this done every 1-3 years. Abdominal aortic aneurysm (AAA) screening. You may need this if you are a current or former smoker. Osteoporosis. You may be screened starting at age 58 if you are at high risk. Talk with your health care provider about your test results, treatment options, and if necessary, the need for more tests.  Vaccines  Your health care provider may recommend certain vaccines, such as: Influenza vaccine. This is recommended every  year. Tetanus, diphtheria, and acellular pertussis (Tdap, Td) vaccine. You may need a Td booster every 10 years. Zoster vaccine. You may need this after age 16. Pneumococcal 13-valent conjugate (PCV13) vaccine. One dose is recommended after age 75. Pneumococcal polysaccharide (PPSV23) vaccine. One dose is recommended after age 18. Talk to your health care provider about which screenings and vaccines you need and how often you need them. This information is not intended to replace advice given to you by your health care provider. Make sure you discuss any questions you have with your health care provider. Document Released: 01/06/2016 Document Revised: 08/29/2016 Document Reviewed: 10/11/2015 Elsevier Interactive Patient Education  2017 [employer] Prevention in the Home Falls can cause injuries. They can happen to people of all ages. There are many things you can do to make your home safe and to help prevent falls. What can I do on the outside of my home? Regularly fix the edges of walkways and driveways and fix any cracks. Remove anything that might make you trip as you walk through a door, such as a raised step or threshold. Trim any bushes or trees on the path to your home. Use bright outdoor lighting. Clear any walking paths of anything that might make someone trip, such as rocks or tools. Regularly check to see if handrails are loose or broken. Make sure that both sides of any steps have handrails. Any raised decks and porches should have guardrails on the edges. Have any leaves, snow, or ice cleared regularly. Use sand or salt on walking paths during winter. Clean up any spills in your garage right away. This includes oil or grease spills. What can I do in the bathroom? Use night lights. Install grab bars by the toilet and in the tub and shower. Do not use towel bars as grab bars. Use non-skid mats or decals in the tub or shower. If you need to sit down in the shower, use a  plastic, non-slip stool. Keep the floor dry. Clean up any water that spills on the floor as soon as it happens. Remove soap buildup in the tub or shower regularly. Attach bath mats securely with double-sided non-slip rug tape. Do not have throw rugs and other things on the floor that can make you trip. What can I do in the bedroom? Use night lights. Make sure that you have a light by your bed that is easy to reach. Do not use any sheets or blankets that are too big for your bed. They should not hang down onto the floor. Have a firm chair that has side arms. You can use this for support while you get dressed. Do not have throw rugs and other things on the floor that can make you trip. What can I do in the kitchen? Clean up any spills right away. Avoid walking on wet floors. Keep items that you use a lot in easy-to-reach places. If you need to reach something above you, use a strong step stool that has a grab bar. Keep electrical cords out of the way. Do not use floor polish or wax that makes floors slippery. If you must use wax, use non-skid floor wax. Do not have throw rugs and other things on the floor that can make you trip. What can I do with my stairs? Do not leave any items on the stairs. Make sure that  there are handrails on both sides of the stairs and use them. Fix handrails that are broken or loose. Make sure that handrails are as long as the stairways. Check any carpeting to make sure that it is firmly attached to the stairs. Fix any carpet that is loose or worn. Avoid having throw rugs at the top or bottom of the stairs. If you do have throw rugs, attach them to the floor with carpet tape. Make sure that you have a light switch at the top of the stairs and the bottom of the stairs. If you do not have them, ask someone to add them for you. What else can I do to help prevent falls? Wear shoes that: Do not have high heels. Have rubber bottoms. Are comfortable and fit you  well. Are closed at the toe. Do not wear sandals. If you use a stepladder: Make sure that it is fully opened. Do not climb a closed stepladder. Make sure that both sides of the stepladder are locked into place. Ask someone to hold it for you, if possible. Clearly mark and make sure that you can see: Any grab bars or handrails. First and last steps. Where the edge of each step is. Use tools that help you move around (mobility aids) if they are needed. These include: Canes. Walkers. Scooters. Crutches. Turn on the lights when you go into a dark area. Replace any light bulbs as soon as they burn out. Set up your furniture so you have a clear path. Avoid moving your furniture around. If any of your floors are uneven, fix them. If there are any pets around you, be aware of where they are. Review your medicines with your doctor. Some medicines can make you feel dizzy. This can increase your chance of falling. Ask your doctor what other things that you can do to help prevent falls. This information is not intended to replace advice given to you by your health care provider. Make sure you discuss any questions you have with your health care provider. Document Released: 10/06/2009 Document Revised: 05/17/2016 Document Reviewed: 01/14/2015 Elsevier Interactive Patient Education  2017 [employer].

## 2021-10-13 DIAGNOSIS — H2512 Age-related nuclear cataract, left eye: Secondary | ICD-10-CM | POA: Diagnosis not present

## 2021-10-13 DIAGNOSIS — Z7952 Long term (current) use of systemic steroids: Secondary | ICD-10-CM | POA: Diagnosis not present

## 2021-10-13 DIAGNOSIS — Z79899 Other long term (current) drug therapy: Secondary | ICD-10-CM | POA: Diagnosis not present

## 2021-10-13 DIAGNOSIS — H401133 Primary open-angle glaucoma, bilateral, severe stage: Secondary | ICD-10-CM | POA: Diagnosis not present

## 2021-11-13 DIAGNOSIS — Z961 Presence of intraocular lens: Secondary | ICD-10-CM | POA: Diagnosis not present

## 2021-11-13 DIAGNOSIS — H401133 Primary open-angle glaucoma, bilateral, severe stage: Secondary | ICD-10-CM | POA: Diagnosis not present

## 2021-11-13 DIAGNOSIS — H18413 Arcus senilis, bilateral: Secondary | ICD-10-CM | POA: Diagnosis not present

## 2021-11-13 DIAGNOSIS — H5789 Other specified disorders of eye and adnexa: Secondary | ICD-10-CM | POA: Diagnosis not present

## 2021-11-13 DIAGNOSIS — H02886 Meibomian gland dysfunction of left eye, unspecified eyelid: Secondary | ICD-10-CM | POA: Diagnosis not present

## 2021-11-13 DIAGNOSIS — H02883 Meibomian gland dysfunction of right eye, unspecified eyelid: Secondary | ICD-10-CM | POA: Diagnosis not present

## 2022-01-02 ENCOUNTER — Ambulatory Visit: Payer: Medicare PPO | Admitting: Internal Medicine

## 2022-01-02 VITALS — BP 136/68 | HR 67 | Temp 97.9°F | Resp 16 | Ht 69.0 in | Wt 197.4 lb

## 2022-01-02 DIAGNOSIS — E785 Hyperlipidemia, unspecified: Secondary | ICD-10-CM

## 2022-01-02 DIAGNOSIS — R399 Unspecified symptoms and signs involving the genitourinary system: Secondary | ICD-10-CM | POA: Diagnosis not present

## 2022-01-02 DIAGNOSIS — G4709 Other insomnia: Secondary | ICD-10-CM | POA: Diagnosis not present

## 2022-01-02 DIAGNOSIS — I10 Essential (primary) hypertension: Secondary | ICD-10-CM | POA: Diagnosis not present

## 2022-01-02 LAB — BASIC METABOLIC PANEL
BUN: 18 mg/dL (ref 6–23)
CO2: 26 mEq/L (ref 19–32)
Calcium: 9.1 mg/dL (ref 8.4–10.5)
Chloride: 109 mEq/L (ref 96–112)
Creatinine, Ser: 1.17 mg/dL (ref 0.40–1.50)
GFR: 57.92 mL/min — ABNORMAL LOW (ref >60.00)
Glucose, Bld: 101 mg/dL — ABNORMAL HIGH (ref 70–99)
Potassium: 4.2 mEq/L (ref 3.5–5.1)
Sodium: 141 mEq/L (ref 135–145)

## 2022-01-02 LAB — CBC WITH DIFFERENTIAL/PLATELET
Basophils Absolute: 0 10*3/uL (ref 0.0–0.1)
Basophils Relative: 0.6 % (ref 0.0–3.0)
Eosinophils Absolute: 0.4 10*3/uL (ref 0.0–0.7)
Eosinophils Relative: 7 % — ABNORMAL HIGH (ref 0.0–5.0)
HCT: 44.1 % (ref 39.0–52.0)
Hemoglobin: 14.1 g/dL (ref 13.0–17.0)
Lymphocytes Relative: 24.1 % (ref 12.0–46.0)
Lymphs Abs: 1.2 10*3/uL (ref 0.7–4.0)
MCHC: 32.1 g/dL (ref 30.0–36.0)
MCV: 89.4 fl (ref 78.0–100.0)
Monocytes Absolute: 0.6 10*3/uL (ref 0.1–1.0)
Monocytes Relative: 12.4 % — ABNORMAL HIGH (ref 3.0–12.0)
Neutro Abs: 2.8 10*3/uL (ref 1.4–7.7)
Neutrophils Relative %: 55.9 % (ref 43.0–77.0)
Platelets: 82 10*3/uL — ABNORMAL LOW (ref 150.0–400.0)
RBC: 4.93 Mil/uL (ref 4.22–5.81)
RDW: 13.1 % (ref 11.5–15.5)
WBC: 5.1 10*3/uL (ref 4.0–10.5)

## 2022-01-02 NOTE — Progress Notes (Signed)
Subjective:    Patient ID: Darren Barnes, male    DOB: 12/27/1940, 81 y.o.   MRN: [id number]  DOS:  01/02/2022 Type of visit - description: f/u  Since the last office visit in general is feeling well. Denies chest pain or difficulty breathing. No lower extremity edema.  Did report difficulty with his sleep for the last 3 weeks. Typically goes to bed at 10 PM, then he needs to go to urinate and is unable to go back to sleep. Nocturia is a chronic issue and is not getting worse but before he used to be able to go back to sleep without any problems after his sleep was interrupted.    Advil PM helped. Denies any major problems with anxiety.  Review of Systems See above   Past Medical History:  Diagnosis Date   Cataract    ED (erectile dysfunction)    Glaucoma    both eyes   Hx of adenomatous colonic polyps 07/30/2017   Hyperlipidemia    Hypertension    Left inguinal hernia    Nocturia    saw urology 2012, was eval, rx vesicare (after several other meds failed)   Osteoarthritis    Sleep apnea    no cpap used could not tolerate, mild osa per sleep study   Thrombocytopenia ([place])    saw hematology, observe    Past Surgical History:  Procedure Laterality Date   anal polyp resection  2008   Dr Debbora Presto   Benign tumor from Left arm  yrs ago   colonscopy  08/01/2020   polyps removed   EYE SURGERY  02/22/2020   right eye sx for glaucoma   INGUINAL HERNIA REPAIR Left 09/01/2020   Procedure: OPEN LEFT INGUINAL HERNIA REPAIR WITH MESH;  Surgeon: Coralie Keens, MD;  Location: [place];  Service: General;  Laterality: Left;   KNEE ARTHROSCOPY  2004   Sinus surgery for benign tumor  2000   stem cell injections to both knees  2017    Current Outpatient Medications  Medication Instructions   aspirin EC 81 mg, Oral, Daily, Swallow whole.   brimonidine (ALPHAGAN) 0.2 % ophthalmic solution Place 1 drop into both eyes 2 times daily.   diltiazem (CARDIZEM CD) 240 MG 24  hr capsule TAKE 1 CAPSULE(240 MG) BY MOUTH IN THE MORNING AND AT BEDTIME   dorzolamide-timolol (COSOPT) 22.3-6.8 MG/ML ophthalmic solution 1 drop, Both Eyes, 2 times daily   isosorbide mononitrate (IMDUR) 15 mg, Oral, Daily   latanoprost (XALATAN) 0.005 % ophthalmic solution 1 drop, Both Eyes, Daily at bedtime   multivitamin (THERAGRAN) per tablet 1 tablet, Oral, Daily,     nitroGLYCERIN (NITROSTAT) 0.4 mg, Sublingual, Every 5 min x3 PRN   rosuvastatin (CRESTOR) 10 mg, Oral, Daily   tamsulosin (FLOMAX) 0.4 mg, Oral, 2 times daily   telmisartan (MICARDIS) 40 MG tablet TAKE 1 TABLET BY MOUTH EVERY DAY       Objective:   Physical Exam BP 136/68 (BP Location: Left Arm, Patient Position: Sitting, Cuff Size: Small)    Pulse 67    Temp 97.9 F (36.6 C) (Oral)    Resp 16    Ht 5\' 9"  (1.753 m)    Wt 197 lb 6 oz (89.5 kg)    SpO2 97%    BMI 29.15 kg/m  General:   Well developed, NAD, BMI noted.  HEENT:  Normocephalic . Face symmetric, atraumatic Lungs:  CTA B Normal respiratory effort, no intercostal retractions, no accessory muscle use.  Heart: RRR,  no murmur.  Abdomen:  Not distended, soft, non-tender. No rebound or rigidity. At the left rib cage anteriorly has subcu there is subtle lump, smaller compared to 05/25/2016.   Skin: Not pale. Not jaundice Lower extremities: no pretibial edema bilaterally  Neurologic:  alert & oriented X3.  Speech normal, gait appropriate for age and unassisted Psych--  Cognition and judgment appear intact.  Cooperative with normal attention span and concentration.  Behavior appropriate. No anxious or depressed appearing.     Assessment    Assessment   HTN- on Cardizem for a while, added losartan 10-2015 Hyperlipidemia ( Lipitor .  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 Rx medical mgmt   PLAN: HTN: Reports very good ambulatory BPs, check BMP and CBC, continue Cardizem, Imdur, olmesartan. Hyperlipidemia: Well-controlled on Crestor OSA: Still snore but is CPAP intolerant. Lipoma: Has a left abdominal wall lipoma noted 05/25/2016, on today's exam it seems smaller. Insomnia: New issue, he wakes up to 3 times a night to go to the bathroom, before was able to go back to sleep but in the last 3 weeks he has not.  Advil PM helped without somnolence the next day. Recommend good sleep habits, melatonin nightly, Tylenol PM as needed.  Call if not better. CAD: Asymptomatic, rec to get new NTG every few months. Preventive care: Had a flu COVID vaccines. RTC 6 months.    This visit occurred during the SARS-CoV-2 public health emergency.  Safety protocols were in place, including screening questions prior to the visit, additional usage of staff PPE, and extensive cleaning of exam room while observing appropriate contact time as indicated for disinfecting solutions.

## 2022-01-02 NOTE — Assessment & Plan Note (Signed)
HTN: Reports very good ambulatory BPs, check BMP and CBC, continue Cardizem, Imdur, olmesartan. Hyperlipidemia: Well-controlled on Crestor OSA: Still snore but is CPAP intolerant. Lipoma: Has a left abdominal wall lipoma noted 05/25/2016, on today's exam it seems smaller. Insomnia: New issue, he wakes up to 3 times a night to go to the bathroom, before was able to go back to sleep but in the last 3 weeks he has not.  Advil PM helped without somnolence the next day. Recommend good sleep habits, melatonin nightly, Tylenol PM as needed.  Call if not better. CAD: Asymptomatic, rec to get new NTG every few months. Preventive care: Had a flu COVID vaccines. RTC 6 months.

## 2022-01-02 NOTE — Patient Instructions (Addendum)
For difficulty with his sleep Take over-the-counter melatonin every night See sleep tips below. Okay to take Tylenol PM as needed if you wake up.   HEALTHY SLEEP Sleep hygiene: Basic rules for a good night's sleep  Sleep only as much as you need to feel rested and then get out of bed  Keep a regular sleep schedule  Avoid forcing sleep  Exercise regularly for at least 20 minutes, preferably 4 to 5 hours before bedtime  Avoid caffeinated beverages after lunch  Avoid alcohol near bedtime: no "night cap"  Avoid smoking, especially in the evening  Do not go to bed hungry  Adjust bedroom environment  Avoid prolonged use of light-emitting screens before bedtime   Deal with your worries before bedtime    Check the  blood pressure regularly BP GOAL is between 110/65 and  135/85. If it is consistently higher or lower, let me know    GO TO THE LAB : Get the blood work     [place], [place] back for   a physical exam by 06-2022

## 2022-01-25 DIAGNOSIS — Z79899 Other long term (current) drug therapy: Secondary | ICD-10-CM | POA: Diagnosis not present

## 2022-01-25 DIAGNOSIS — H401133 Primary open-angle glaucoma, bilateral, severe stage: Secondary | ICD-10-CM | POA: Diagnosis not present

## 2022-02-12 ENCOUNTER — Other Ambulatory Visit: Payer: Self-pay | Admitting: Internal Medicine

## 2022-03-12 DIAGNOSIS — H401133 Primary open-angle glaucoma, bilateral, severe stage: Secondary | ICD-10-CM | POA: Diagnosis not present

## 2022-03-12 DIAGNOSIS — H209 Unspecified iridocyclitis: Secondary | ICD-10-CM | POA: Diagnosis not present

## 2022-03-12 DIAGNOSIS — Z79899 Other long term (current) drug therapy: Secondary | ICD-10-CM | POA: Diagnosis not present

## 2022-03-13 ENCOUNTER — Other Ambulatory Visit: Payer: Self-pay

## 2022-03-13 DIAGNOSIS — H409 Unspecified glaucoma: Secondary | ICD-10-CM | POA: Insufficient documentation

## 2022-03-13 NOTE — Addendum Note (Signed)
Addended byDamita Dunnings D on: 03/13/2022 09:31 AM ? ? Modules accepted: Orders ? ?

## 2022-03-14 ENCOUNTER — Other Ambulatory Visit: Payer: Self-pay

## 2022-03-14 DIAGNOSIS — H409 Unspecified glaucoma: Secondary | ICD-10-CM

## 2022-04-12 ENCOUNTER — Other Ambulatory Visit (INDEPENDENT_AMBULATORY_CARE_PROVIDER_SITE_OTHER): Payer: Medicare PPO

## 2022-04-12 DIAGNOSIS — H409 Unspecified glaucoma: Secondary | ICD-10-CM

## 2022-04-12 LAB — BASIC METABOLIC PANEL
BUN: 17 mg/dL (ref 6–23)
CO2: 23 mEq/L (ref 19–32)
Calcium: 9 mg/dL (ref 8.4–10.5)
Chloride: 111 mEq/L (ref 96–112)
Creatinine, Ser: 1.23 mg/dL (ref 0.40–1.50)
GFR: 54.44 mL/min — ABNORMAL LOW (ref >60.00)
Glucose, Bld: 97 mg/dL (ref 70–99)
Potassium: 4.3 mEq/L (ref 3.5–5.1)
Sodium: 141 mEq/L (ref 135–145)

## 2022-04-30 ENCOUNTER — Other Ambulatory Visit: Payer: Self-pay | Admitting: Internal Medicine

## 2022-06-19 ENCOUNTER — Other Ambulatory Visit: Payer: Self-pay | Admitting: Internal Medicine

## 2022-07-03 DIAGNOSIS — Z961 Presence of intraocular lens: Secondary | ICD-10-CM | POA: Diagnosis not present

## 2022-07-03 DIAGNOSIS — H2512 Age-related nuclear cataract, left eye: Secondary | ICD-10-CM | POA: Diagnosis not present

## 2022-07-03 DIAGNOSIS — H401133 Primary open-angle glaucoma, bilateral, severe stage: Secondary | ICD-10-CM | POA: Diagnosis not present

## 2022-07-03 DIAGNOSIS — Z79899 Other long term (current) drug therapy: Secondary | ICD-10-CM | POA: Diagnosis not present

## 2022-07-04 ENCOUNTER — Encounter: Payer: Self-pay | Admitting: Physician Assistant

## 2022-07-04 ENCOUNTER — Ambulatory Visit: Payer: Medicare PPO | Admitting: Physician Assistant

## 2022-07-04 ENCOUNTER — Other Ambulatory Visit: Payer: Self-pay | Admitting: *Deleted

## 2022-07-04 VITALS — BP 140/60 | HR 74 | Ht 69.0 in | Wt 179.0 lb

## 2022-07-04 DIAGNOSIS — R011 Cardiac murmur, unspecified: Secondary | ICD-10-CM

## 2022-07-04 DIAGNOSIS — I2510 Atherosclerotic heart disease of native coronary artery without angina pectoris: Secondary | ICD-10-CM | POA: Diagnosis not present

## 2022-07-04 DIAGNOSIS — I700 Atherosclerosis of aorta: Secondary | ICD-10-CM | POA: Diagnosis not present

## 2022-07-04 DIAGNOSIS — I10 Essential (primary) hypertension: Secondary | ICD-10-CM | POA: Diagnosis not present

## 2022-07-04 MED ORDER — ISOSORBIDE MONONITRATE ER 30 MG PO TB24: ORAL_TABLET | ORAL | 3 refills | Status: DC

## 2022-07-04 MED ORDER — DILTIAZEM HCL ER COATED BEADS 240 MG PO CP24: ORAL_CAPSULE | ORAL | 3 refills | Status: DC

## 2022-07-04 NOTE — Progress Notes (Signed)
Cardiology Office Note:    Date:  07/04/2022   ID:  Darren Barnes, DOB 10-09-1941, MRN [id number]  PCP:  Colon Branch, MD  [facility] Cardiologist:  Werner Lean, MD  [place] Electrophysiologist:  None   Chief Complaint: 6 month follow up   History of Present Illness:    Darren Barnes is a 81 y.o. male with a hx of HTN, Dyslipidemia, OSA in the past without treatment, Aortic Vavle Sclerosis with murmur and CAD by coronary CT seen for follow up.   Patient has positive coronary CT showing CAD 02/2021.  CT FFR analysis shows multiple lesions of significance or possible significance: Mid RCA, distal RCA, Distal LAD, Distal LCX.  Shared decision made to treat medically.  Here today for follow-up with wife.  No exercise or walking.  He is dealing with insomnia for past few weeks.  Melatonin is not working.  Also having recent issue despite surgery.  Denies chest pain, shortness of breath, orthopnea, PND, syncope, lower extremity edema or melena.    Past Medical History:  Diagnosis Date   Cataract    ED (erectile dysfunction)    Glaucoma    both eyes   Hx of adenomatous colonic polyps 07/30/2017   Hyperlipidemia    Hypertension    Left inguinal hernia    Nocturia    saw urology 2012, was eval, rx vesicare (after several other meds failed)   Osteoarthritis    Sleep apnea    no cpap used could not tolerate, mild osa per sleep study   Thrombocytopenia ([place])    saw hematology, observe    Past Surgical History:  Procedure Laterality Date   anal polyp resection  2008   Dr Debbora Presto   Benign tumor from Left arm  yrs ago   colonscopy  08/01/2020   polyps removed   EYE SURGERY  02/22/2020   right eye sx for glaucoma   INGUINAL HERNIA REPAIR Left 09/01/2020   Procedure: OPEN LEFT INGUINAL HERNIA REPAIR WITH MESH;  Surgeon: Coralie Keens, MD;  Location: [place];  Service: General;  Laterality: Left;   KNEE ARTHROSCOPY  2004   Sinus surgery for benign  tumor  2000   stem cell injections to both knees  2017    Current Medications: Current Meds  Medication Sig   aspirin EC 81 MG tablet Take 81 mg by mouth daily. Swallow whole.   brimonidine (ALPHAGAN) 0.2 % ophthalmic solution Place 1 drop into both eyes 2 times daily.   diltiazem (CARDIZEM CD) 240 MG 24 hr capsule TAKE 1 CAPSULE(240 MG) BY MOUTH IN THE MORNING AND AT BEDTIME   dorzolamide-timolol (COSOPT) 22.3-6.8 MG/ML ophthalmic solution Place 1 drop into both eyes 2 (two) times daily.   isosorbide mononitrate (IMDUR) 30 MG 24 hr tablet TAKE 1/2 TABLET(15 MG) BY MOUTH DAILY   ketorolac (ACULAR) 0.5 % ophthalmic solution Place 1 drop into the left eye in the morning, at noon, and at bedtime.   latanoprost (XALATAN) 0.005 % ophthalmic solution Place 1 drop into both eyes at bedtime.   methazolamide (NEPTAZANE) 50 MG tablet Take 1 tablet (50 mg total) by mouth 3 (three) times daily.   multivitamin (THERAGRAN) per tablet Take 1 tablet by mouth daily.   nitroGLYCERIN (NITROSTAT) 0.4 MG SL tablet Place 1 tablet (0.4 mg total) under the tongue every 5 (five) minutes x 3 doses as needed for chest pain.   rosuvastatin (CRESTOR) 10 MG tablet TAKE 1 TABLET(10 MG) BY MOUTH  DAILY   tamsulosin (FLOMAX) 0.4 MG CAPS capsule TAKE 1 CAPSULE(0.4 MG) BY MOUTH IN THE MORNING AND AT BEDTIME   telmisartan (MICARDIS) 40 MG tablet TAKE 1 TABLET BY MOUTH EVERY DAY     Allergies:   Patient has no known allergies.   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  Tobacco Use   Smoking status: Former    Packs/day: 0.50    Years: 5.00    Total pack years: 2.50    Types: Cigarettes    Quit date: 12/24/1958    Years since quitting: 63.5   Smokeless tobacco: Never  Vaping Use   Vaping Use:  Never used  Substance and Sexual Activity   Alcohol use: Yes    Comment: socially    Drug use: No   Sexual activity: Not on file  Other Topics Concern   Not on file  Social History Narrative   Lives w/ wife   Lost one son, 2 living children, 8 g-kids            Social Determinants of Health   Financial Resource Strain: [place]  (09/16/2021)   Overall Financial Resource Strain (CARDIA)    Difficulty of Paying Living Expenses: Not hard at all  Food Insecurity: No [place] (09/16/2021)   Hunger Vital Sign    Worried About Running Out of Food in the Last Year: Never true    [place] in the Last Year: Never true  Transportation Needs: No Transportation Needs (09/16/2021)   PRAPARE - Hydrologist (Medical): No    Lack of Transportation (Non-Medical): No  Physical Activity: Insufficiently Active (09/16/2021)   Exercise Vital Sign    Days of Exercise per Week: 7 days    Minutes of Exercise per Session: 10 min  Stress: No Stress Concern Present (09/16/2021)   [place]    Feeling of Stress : Not at all  Social Connections: [place] (09/16/2021)   Social Connection and Isolation Panel [NHANES]    Frequency of Communication with Friends and Family: More than three times a week    Frequency of Social Gatherings with Friends and Family: Twice a week    Attends Religious Services: 1 to 4 times per year    Active Member of [employer] or Organizations: Yes    Attends Archivist Meetings: 1 to 4 times per year    Marital Status: Married     Family History: The patient's family history includes Coronary artery disease (age of onset: 71) in his father; Diabetes in his mother; Hypertension in his mother; Stroke (age of onset: 66) in his mother. There is no history of Colon cancer, Prostate cancer, Esophageal cancer, Pancreatic cancer, Rectal cancer, or Stomach cancer.     ROS:   Please see the history of present illness.    All other systems reviewed and are negative.   EKGs/Labs/Other Studies Reviewed:    The following studies were reviewed today:  Echo 02/2021 1. Left ventricular ejection fraction, by estimation, is 60 to 65%. The  left ventricle has normal function. The left ventricle has no regional  wall motion abnormalities. Left  ventricular diastolic parameters are  consistent with Grade I diastolic  dysfunction (impaired relaxation).   2. Right ventricular systolic function is normal. The right ventricular  size is normal.   3. The mitral valve is normal in structure. No evidence of mitral valve  regurgitation. No evidence of mitral stenosis.   4. The aortic valve is normal in structure. Aortic valve regurgitation is  not visualized. No aortic stenosis is present.   5. The inferior vena cava is normal in size with greater than 50%  respiratory variability, suggesting right atrial pressure of 3 mmHg.    Cardiac CT : Date:02/23/2021 Results: Aorta:  Normal size.  Aortic atherosclerosis noted.  No dissection.   Aortic Valve:  Tri-leaflet.  No calcifications.   Coronary Arteries:  Normal coronary origin.  Right dominance.   Coronary calcium score of 885. This was 82nd percentile for age, sex, and race matched control.   RCA is a large dominant artery that gives rise to PDA and PLA. There is a mild non-obstructive (25-49%) calcified plaque in the proximal vessel. There is a moderate stenosis (50-70%) soft plaque in the mid vessel. There are tandem mild non-obstructive (25-49%) calcified plaques in the distal vessel.   Left main is a large artery that gives rise to LAD and LCX arteries. There are tandem calcified plaques (< 10% and 20%) in the distal left main.   LAD is a large vessel that gives rise to one large high D1 Branch and one small D2 vessel. There is a mild non-obstructive (25-49%) calcified plaque in the proximal vessel.  There is a mild non-obstructive (25-49%) calcified plaque in the mid vessel with a napkin ring's sign (high risk feature).There is a mild non-obstructive (25-49%) calcified plaque in the distal vessel. There are mild non-obstructive (25-49%) calcified plaques in both the ostium of the D1 vessel and the mid D1 vessel. The Glagov phenomenon is noted.   LCX is a non-dominant artery that bifurcates to one OM1 branch. There is a moderate stenosis (50-70%) mixed plaque in the proximal vessel. There is a mild non-obstructive (25-49%) calcified plaque in the mid vessel with a napkin ring's sign (high risk feature), followed by moderate stenosis (50-70%) mixed plaque (lesion length 19 mm).     IMPRESSION: Minimal dependent atelectasis crash that dependent atelectasis in the lower lobes.   Descending aortic atherosclerosis.   Old granulomatous disease in the spleen.   No acute findings.   1. Left Main: No significant functional stenosis, CT-FFR 0.97.   2. LAD: No significant functional stenosis, CT-FFR 0.87 at the proximal D1, CT-FFR 0.87 in the mid LAD, but significant functional stenosis in the distal LAD, CT-FFR 0.75. 3. LCX: Significant functional stenosis in the distal LCX, CT-FFR 0.73. 4. RCA: Possibly significant functional stenosis in the mid RCA, CT-FFR 0.82; significant functional stenosis in the distal RCA CT-FFR 0.63.   IMPRESSION: 1. CT FFR analysis shows multiple lesions of significance or possible significance: Mid RCA, distal RCA, Distal LAD, Distal LCX.   EKG:  EKG is  ordered today.  The ekg ordered today demonstrates sinus rhythm, nonspecific ST changes inferiorly  Recent Labs: 01/02/2022: Hemoglobin 14.1; Platelets 82.0 04/12/2022: BUN 17; Creatinine, Ser 1.23; Potassium 4.3; Sodium 141  Recent Lipid Panel    Component Value Date/Time   CHOL 120 05/11/2021 1438   TRIG 65 05/11/2021 1438   TRIG 76 11/12/2006 0824   HDL 50 05/11/2021 1438   CHOLHDL 2.4  05/11/2021 1438   CHOLHDL 4 06/21/2020 0929  VLDL 13.4 06/21/2020 0929   LDLCALC 56 05/11/2021 1438   LDLDIRECT 129.5 05/14/2013 0915    Physical Exam:    VS:  BP 140/60   Pulse 74   Ht '5\' 9"'$  (1.753 m)   Wt 179 lb (81.2 kg)   SpO2 98%   BMI 26.43 kg/m     Wt Readings from Last 3 Encounters:  07/04/22 179 lb (81.2 kg)  01/02/22 197 lb 6 oz (89.5 kg)  09/16/21 190 lb (86.2 kg)     GEN:  Well nourished, well developed in no acute distress HEENT: Normal NECK: No JVD; No carotid bruits LYMPHATICS: No lymphadenopathy CARDIAC: RRR, +  murmurs, rubs, gallops RESPIRATORY:  Clear to auscultation without rales, wheezing or rhonchi  ABDOMEN: Soft, non-tender, non-distended MUSCULOSKELETAL:  No edema; No deformity  SKIN: Warm and dry NEUROLOGIC:  Alert and oriented x 3 PSYCHIATRIC:  Normal affect   ASSESSMENT AND PLAN:    CAD by positive coronary CT as above This was treated medically.  No angina but not active.  He is planning to use stationary bike.  He will let us know if any chest pain or shortness of breath.  Continue medical therapy with aspirin, statin and Imdur.  2.  Hyperlipidemia -He is due for labs.  Continue Crestor 10 mg daily.  We will have lab work by PCP.  He has eaten today and noy like to come back.  3.  Hypertension -Blood pressure stable on current medication.  No change.  Medication Adjustments/Labs and Tests Ordered: Current medicines are reviewed at length with the patient today.  Concerns regarding medicines are outlined above.  Orders Placed This Encounter  Procedures   EKG 12-Lead   No orders of the defined types were placed in this encounter.   Patient Instructions  Medication Instructions:   Your physician recommends that you continue on your current medications as directed. Please refer to the Current Medication list given to you today.   *If you need a refill on your cardiac medications before your next appointment, please call your  pharmacy*   Lab Work:  None ordered.  If you have labs (blood work) drawn today and your tests are completely normal, you will receive your results only by: [place] (if you have MyChart) OR A paper copy in the mail If you have any lab test that is abnormal or we need to change your treatment, we will call you to review the results.   Testing/Procedures:  None ordered.   Follow-Up: At [facility], you and your health needs are our priority.  As part of our continuing mission to provide you with exceptional heart care, we have created designated Provider Care Teams.  These Care Teams include your primary Cardiologist (physician) and Advanced Practice Providers (APPs -  Physician Assistants and Nurse Practitioners) who all work together to provide you with the care you need, when you need it.  We recommend signing up for the patient portal called "MyChart".  Sign up information is provided on this After Visit Summary.  MyChart is used to connect with patients for Virtual Visits (Telemedicine).  Patients are able to view lab/test results, encounter notes, upcoming appointments, etc.  Non-urgent messages can be sent to your provider as well.   To learn more about what you can do with MyChart, go to [email or web address].    Your next appointment:   6 month(s)  The format for your next appointment:   In Person  Provider:   Lyda Kalata A  Gasper Sells, MD     Other Instructions  Your physician wants you to follow-up in: 6 months with Dr. Gasper Sells. You will receive a reminder letter in the mail two months in advance. If you don't receive a letter, please call our office to schedule the follow-up appointment.   Important Information About Sugar         Jarrett Soho, PA  07/04/2022 2:13 PM    [facility]

## 2022-07-04 NOTE — Patient Instructions (Signed)
Medication Instructions:   Your physician recommends that you continue on your current medications as directed. Please refer to the Current Medication list given to you today.   *If you need a refill on your cardiac medications before your next appointment, please call your pharmacy*   Lab Work:  None ordered.  If you have labs (blood work) drawn today and your tests are completely normal, you will receive your results only by: [place] (if you have MyChart) OR A paper copy in the mail If you have any lab test that is abnormal or we need to change your treatment, we will call you to review the results.   Testing/Procedures:  None ordered.   Follow-Up: At [facility], you and your health needs are our priority.  As part of our continuing mission to provide you with exceptional heart care, we have created designated Provider Care Teams.  These Care Teams include your primary Cardiologist (physician) and Advanced Practice Providers (APPs -  Physician Assistants and Nurse Practitioners) who all work together to provide you with the care you need, when you need it.  We recommend signing up for the patient portal called "MyChart".  Sign up information is provided on this After Visit Summary.  MyChart is used to connect with patients for Virtual Visits (Telemedicine).  Patients are able to view lab/test results, encounter notes, upcoming appointments, etc.  Non-urgent messages can be sent to your provider as well.   To learn more about what you can do with MyChart, go to [email or web address].    Your next appointment:   6 month(s)  The format for your next appointment:   In Person  Provider:   Werner Lean, MD     Other Instructions  Your physician wants you to follow-up in: 6 months with Dr. Gasper Sells. You will receive a reminder letter in the mail two months in advance. If you don't receive a letter, please call our office to schedule the follow-up  appointment.   Important Information About Sugar

## 2022-07-11 ENCOUNTER — Encounter: Payer: Self-pay | Admitting: Internal Medicine

## 2022-07-11 ENCOUNTER — Ambulatory Visit: Payer: Medicare PPO | Admitting: Internal Medicine

## 2022-07-11 ENCOUNTER — Ambulatory Visit: Payer: Medicare PPO | Admitting: Family Medicine

## 2022-07-11 VITALS — BP 126/70 | HR 72 | Temp 98.0°F | Resp 16 | Ht 69.0 in | Wt 179.4 lb

## 2022-07-11 DIAGNOSIS — I10 Essential (primary) hypertension: Secondary | ICD-10-CM | POA: Diagnosis not present

## 2022-07-11 DIAGNOSIS — H409 Unspecified glaucoma: Secondary | ICD-10-CM

## 2022-07-11 DIAGNOSIS — I2510 Atherosclerotic heart disease of native coronary artery without angina pectoris: Secondary | ICD-10-CM | POA: Diagnosis not present

## 2022-07-11 DIAGNOSIS — G4700 Insomnia, unspecified: Secondary | ICD-10-CM

## 2022-07-11 DIAGNOSIS — F321 Major depressive disorder, single episode, moderate: Secondary | ICD-10-CM | POA: Insufficient documentation

## 2022-07-11 DIAGNOSIS — E7849 Other hyperlipidemia: Secondary | ICD-10-CM

## 2022-07-11 LAB — CBC WITH DIFFERENTIAL/PLATELET
Basophils Absolute: 0 10*3/uL (ref 0.0–0.1)
Basophils Relative: 0.6 % (ref 0.0–3.0)
Eosinophils Absolute: 0.2 10*3/uL (ref 0.0–0.7)
Eosinophils Relative: 2.9 % (ref 0.0–5.0)
HCT: 42.5 % (ref 39.0–52.0)
Hemoglobin: 13.6 g/dL (ref 13.0–17.0)
Lymphocytes Relative: 16.2 % (ref 12.0–46.0)
Lymphs Abs: 0.8 10*3/uL (ref 0.7–4.0)
MCHC: 32 g/dL (ref 30.0–36.0)
MCV: 88.2 fl (ref 78.0–100.0)
Monocytes Absolute: 0.9 10*3/uL (ref 0.1–1.0)
Monocytes Relative: 16.5 % — ABNORMAL HIGH (ref 3.0–12.0)
Neutro Abs: 3.3 10*3/uL (ref 1.4–7.7)
Neutrophils Relative %: 63.8 % (ref 43.0–77.0)
Platelets: 104 10*3/uL — ABNORMAL LOW (ref 150.0–400.0)
RBC: 4.82 Mil/uL (ref 4.22–5.81)
RDW: 13 % (ref 11.5–15.5)
WBC: 5.2 10*3/uL (ref 4.0–10.5)

## 2022-07-11 LAB — ALT: ALT: 6 U/L (ref 0–53)

## 2022-07-11 LAB — LIPID PANEL
Cholesterol: 102 mg/dL (ref 0–200)
HDL: 40.4 mg/dL (ref >39.00)
LDL Cholesterol: 51 mg/dL (ref 0–99)
NonHDL: 61.14
Total CHOL/HDL Ratio: 3
Triglycerides: 53 mg/dL (ref 0.0–149.0)
VLDL: 10.6 mg/dL (ref 0.0–40.0)

## 2022-07-11 LAB — AST: AST: 15 U/L (ref 0–37)

## 2022-07-11 MED ORDER — ZOLPIDEM TARTRATE 5 MG PO TABS
5.0000 mg | ORAL_TABLET | Freq: Every evening | ORAL | 1 refills | Status: DC | PRN
Start: 2022-07-11 — End: 2022-09-04

## 2022-07-11 NOTE — Assessment & Plan Note (Signed)
JJH:ERDEY well controlled.  Last BMP okay, continue diltiazem, Imdur, Micardis. Hyperlipidemia: On rosuvastatin, check FLP AST ALT CAD:  Saw cardiology 07/04/2022, no changes made.  Check CBC Poor vision, decreased hearing: D/t poor vision he is unable to read, watch TV or drive.  Thankfully, his wife is very supportive.  He is also having difficulty hearing.  He will continue seeing his ophthalmologist and recommend a hearing eval. Depression insomnia: The patient is depressed because he cannot do things that he previously enjoyed ,  PHQ-9 high, denies suicidal ideas.  Listening therapy provided, I am definitely sympathetic with his situation.  Treatment should include counseling, also self encouragement. Offered SSRIs, pros and cons discussed, declined but will when/if ready We agreed that is important to treat insomnia, failed OTCs, recommend Ambien 5 mg.  Watch for oversedation.  PDMP was okay. RTC 3 months CPX

## 2022-07-11 NOTE — Progress Notes (Signed)
Subjective:    Patient ID: Darren Barnes, male    DOB: 05/06/1941, 81 y.o.   MRN: [id number]  DOS:  07/11/2022 Type of visit - description: Follow-up, here with his wife  Since last visit, his vision has definitely decreased. Unable to perform most of his ADLs without help. Hearing is decreased as well.  Denies tinnitus.  HOH is readily noticed by wife and friends but does not bother the patient.  Admits to feeling sad and down.  PHQ-9 it is quite elevated but he denies suicidal ideas.    Review of Systems Denies chest pain or difficulty breathing No lower extremity edema Mild memory issues ?  Past Medical History:  Diagnosis Date   Cataract    ED (erectile dysfunction)    Glaucoma    both eyes   Hx of adenomatous colonic polyps 07/30/2017   Hyperlipidemia    Hypertension    Left inguinal hernia    Nocturia    saw urology 2012, was eval, rx vesicare (after several other meds failed)   Osteoarthritis    Sleep apnea    no cpap used could not tolerate, mild osa per sleep study   Thrombocytopenia ([place])    saw hematology, observe    Past Surgical History:  Procedure Laterality Date   anal polyp resection  2008   Dr Debbora Presto   Benign tumor from Left arm  yrs ago   colonscopy  08/01/2020   polyps removed   EYE SURGERY  02/22/2020   right eye sx for glaucoma   INGUINAL HERNIA REPAIR Left 09/01/2020   Procedure: OPEN LEFT INGUINAL HERNIA REPAIR WITH MESH;  Surgeon: Coralie Keens, MD;  Location: [facility];  Service: General;  Laterality: Left;   KNEE ARTHROSCOPY  2004   Sinus surgery for benign tumor  2000   stem cell injections to both knees  2017    Current Outpatient Medications  Medication Instructions   aspirin EC 81 mg, Oral, Daily, Swallow whole.   brimonidine (ALPHAGAN) 0.2 % ophthalmic solution Place 1 drop into both eyes 2 times daily.   diltiazem (CARDIZEM CD) 240 MG 24 hr capsule TAKE 1 CAPSULE(240 MG) BY MOUTH IN THE MORNING AND AT BEDTIME    dorzolamide-timolol (COSOPT) 22.3-6.8 MG/ML ophthalmic solution 1 drop, Both Eyes, 2 times daily   isosorbide mononitrate (IMDUR) 30 MG 24 hr tablet TAKE 1/2 TABLET(15 MG) BY MOUTH DAILY   ketorolac (ACULAR) 0.5 % ophthalmic solution 1 drop, Left Eye, 3 times daily   latanoprost (XALATAN) 0.005 % ophthalmic solution 1 drop, Both Eyes, Daily at bedtime   methazolamide (NEPTAZANE) 50 mg, Oral, 3 times daily   multivitamin (THERAGRAN) per tablet 1 tablet, Oral, Daily,     nitroGLYCERIN (NITROSTAT) 0.4 mg, Sublingual, Every 5 min x3 PRN   rosuvastatin (CRESTOR) 10 MG tablet TAKE 1 TABLET(10 MG) BY MOUTH DAILY   tamsulosin (FLOMAX) 0.4 MG CAPS capsule TAKE 1 CAPSULE(0.4 MG) BY MOUTH IN THE MORNING AND AT BEDTIME   telmisartan (MICARDIS) 40 MG tablet TAKE 1 TABLET BY MOUTH EVERY DAY       Objective:   Physical Exam BP 126/70   Pulse 72   Temp 98 F (36.7 C) (Oral)   Resp 16   Ht '5\' 9"'$  (1.753 m)   Wt 179 lb 6 oz (81.4 kg)   SpO2 97%   BMI 26.49 kg/m  General:   Well developed, NAD, BMI noted. HEENT:  Normocephalic . Face symmetric, atraumatic Lungs:  CTA B  Normal respiratory effort, no intercostal retractions, no accessory muscle use. Heart: RRR,  no murmur.  Lower extremities: no pretibial edema bilaterally  Skin: Not pale. Not jaundice Neurologic:  alert & oriented X3.  Speech normal, gait appropriate for age and unassisted Psych--  Cognition and judgment appear intact.  Cooperative with normal attention span and concentration.  Behavior appropriate. Not anxious but mildly depressed appearing     Assessment     Assessment   HTN- on Cardizem for a while, added losartan 10-2015 Hyperlipidemia ( Lipitor .  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 Rx medical mgmt   PLAN: XTA:VWPVX well controlled.  Last BMP okay, continue diltiazem, Imdur, Micardis. Hyperlipidemia: On rosuvastatin, check FLP AST ALT CAD:  Saw cardiology 07/04/2022, no changes made.  Check CBC Poor vision, decreased hearing: D/t poor vision he is unable to read, watch TV or drive.  Thankfully, his wife is very supportive.  He is also having difficulty hearing.  He will continue seeing his ophthalmologist and recommend a hearing eval. Depression insomnia: The patient is depressed because he cannot do things that he previously enjoyed ,  PHQ-9 high, denies suicidal ideas.  Listening therapy provided, I am definitely sympathetic with his situation.  Treatment should include counseling, also self encouragement. Offered SSRIs, pros and cons discussed, declined but will when/if ready We agreed that is important to treat insomnia, failed OTCs, recommend Ambien 5 mg.  Watch for oversedation.  PDMP was okay. RTC 3 months CPX

## 2022-07-11 NOTE — Patient Instructions (Addendum)
Recommend to proceed with covid booster (bivalent) at your pharmacy.  Flu shot this fall.   Consider seeing a counselor  Consider starting medication for depression such as Lexapro  Start taking Ambien 5 mg 1 tablet at bedtime as needed for insomnia.  Check the  blood pressure regularly BP GOAL is between 110/65 and  135/85. If it is consistently higher or lower, let me know     GO TO THE LAB : Get the blood work     [place], [place] back for a physical exam in 3 months

## 2022-07-12 ENCOUNTER — Telehealth: Payer: Self-pay | Admitting: Internal Medicine

## 2022-07-12 NOTE — Telephone Encounter (Signed)
Please advise 

## 2022-07-12 NOTE — Telephone Encounter (Signed)
Pt stated Lorrin Mais is not helping him sleep and he would like something stronger.

## 2022-07-12 NOTE — Telephone Encounter (Signed)
We can try to take Ambien 5 mg 2 tablets instead of only 1.

## 2022-07-12 NOTE — Telephone Encounter (Signed)
LMOM informing Pt of recommendations. Instructed Pt to call if questions/concerns.

## 2022-07-19 ENCOUNTER — Other Ambulatory Visit: Payer: Self-pay | Admitting: Internal Medicine

## 2022-07-19 DIAGNOSIS — H401133 Primary open-angle glaucoma, bilateral, severe stage: Secondary | ICD-10-CM | POA: Diagnosis not present

## 2022-07-30 DIAGNOSIS — G4733 Obstructive sleep apnea (adult) (pediatric): Secondary | ICD-10-CM | POA: Diagnosis not present

## 2022-07-30 DIAGNOSIS — Z6826 Body mass index (BMI) 26.0-26.9, adult: Secondary | ICD-10-CM | POA: Diagnosis not present

## 2022-07-30 DIAGNOSIS — H401123 Primary open-angle glaucoma, left eye, severe stage: Secondary | ICD-10-CM | POA: Diagnosis not present

## 2022-07-30 DIAGNOSIS — D696 Thrombocytopenia, unspecified: Secondary | ICD-10-CM | POA: Diagnosis not present

## 2022-07-30 DIAGNOSIS — I10 Essential (primary) hypertension: Secondary | ICD-10-CM | POA: Diagnosis not present

## 2022-07-30 DIAGNOSIS — N400 Enlarged prostate without lower urinary tract symptoms: Secondary | ICD-10-CM | POA: Diagnosis not present

## 2022-07-30 DIAGNOSIS — E785 Hyperlipidemia, unspecified: Secondary | ICD-10-CM | POA: Diagnosis not present

## 2022-07-30 DIAGNOSIS — I2510 Atherosclerotic heart disease of native coronary artery without angina pectoris: Secondary | ICD-10-CM | POA: Diagnosis not present

## 2022-07-30 DIAGNOSIS — E669 Obesity, unspecified: Secondary | ICD-10-CM | POA: Diagnosis not present

## 2022-07-31 DIAGNOSIS — H16142 Punctate keratitis, left eye: Secondary | ICD-10-CM | POA: Diagnosis not present

## 2022-07-31 DIAGNOSIS — H1132 Conjunctival hemorrhage, left eye: Secondary | ICD-10-CM | POA: Diagnosis not present

## 2022-07-31 DIAGNOSIS — H02883 Meibomian gland dysfunction of right eye, unspecified eyelid: Secondary | ICD-10-CM | POA: Diagnosis not present

## 2022-07-31 DIAGNOSIS — Z48810 Encounter for surgical aftercare following surgery on the sense organs: Secondary | ICD-10-CM | POA: Diagnosis not present

## 2022-07-31 DIAGNOSIS — H11131 Conjunctival pigmentations, right eye: Secondary | ICD-10-CM | POA: Diagnosis not present

## 2022-07-31 DIAGNOSIS — H18413 Arcus senilis, bilateral: Secondary | ICD-10-CM | POA: Diagnosis not present

## 2022-07-31 DIAGNOSIS — Z961 Presence of intraocular lens: Secondary | ICD-10-CM | POA: Diagnosis not present

## 2022-07-31 DIAGNOSIS — H401133 Primary open-angle glaucoma, bilateral, severe stage: Secondary | ICD-10-CM | POA: Diagnosis not present

## 2022-07-31 DIAGNOSIS — H02886 Meibomian gland dysfunction of left eye, unspecified eyelid: Secondary | ICD-10-CM | POA: Diagnosis not present

## 2022-08-03 ENCOUNTER — Telehealth: Payer: Self-pay | Admitting: Internal Medicine

## 2022-08-03 NOTE — Telephone Encounter (Signed)
Medication:   zolpidem (AMBIEN) 5 MG tablet [[id number]]   Has the patient contacted their pharmacy? No. (If no, request that the patient contact the pharmacy for the refill.) (If yes, when and what did the pharmacy advise?)  Preferred Pharmacy (with phone number or street name):   [id number] [address], [address]  [place], [place] [address]  Phone:  [phone]  Fax:  [phone]   Agent: Please be advised that RX refills may take up to 3 business days. We ask that you follow-up with your pharmacy.

## 2022-08-06 ENCOUNTER — Other Ambulatory Visit: Payer: Self-pay | Admitting: Internal Medicine

## 2022-08-06 NOTE — Telephone Encounter (Signed)
There is 1 refill on file at pharmacy.   zolpidem (AMBIEN) 5 MG tablet 30 tablet 1 07/11/2022    Sig - Route: Take 1 tablet (5 mg total) by mouth at bedtime as needed for sleep. - Oral   Sent to pharmacy as: zolpidem (AMBIEN) 5 MG tablet   E-Prescribing Status: Receipt confirmed by pharmacy (07/11/2022 10:36 AM EDT)

## 2022-08-09 ENCOUNTER — Other Ambulatory Visit: Payer: Self-pay | Admitting: Internal Medicine

## 2022-08-10 DIAGNOSIS — Z48810 Encounter for surgical aftercare following surgery on the sense organs: Secondary | ICD-10-CM | POA: Diagnosis not present

## 2022-08-10 DIAGNOSIS — Z79899 Other long term (current) drug therapy: Secondary | ICD-10-CM | POA: Diagnosis not present

## 2022-08-10 DIAGNOSIS — H401133 Primary open-angle glaucoma, bilateral, severe stage: Secondary | ICD-10-CM | POA: Diagnosis not present

## 2022-08-10 DIAGNOSIS — Z961 Presence of intraocular lens: Secondary | ICD-10-CM | POA: Diagnosis not present

## 2022-08-21 DIAGNOSIS — Z48810 Encounter for surgical aftercare following surgery on the sense organs: Secondary | ICD-10-CM | POA: Diagnosis not present

## 2022-08-21 DIAGNOSIS — H401133 Primary open-angle glaucoma, bilateral, severe stage: Secondary | ICD-10-CM | POA: Diagnosis not present

## 2022-08-21 DIAGNOSIS — Z961 Presence of intraocular lens: Secondary | ICD-10-CM | POA: Diagnosis not present

## 2022-08-21 DIAGNOSIS — Z9689 Presence of other specified functional implants: Secondary | ICD-10-CM | POA: Diagnosis not present

## 2022-09-04 ENCOUNTER — Telehealth: Payer: Self-pay | Admitting: Internal Medicine

## 2022-09-04 NOTE — Telephone Encounter (Signed)
Requesting: Ambien '5mg'$   Contract: None UDS: None Last Visit: 07/11/22 Next Visit:11/01/22 Last Refill: 07/11/22 #30 and 1RF  Please Advise

## 2022-09-04 NOTE — Telephone Encounter (Signed)
PDMP okay, Rx sent 

## 2022-09-07 DIAGNOSIS — H16142 Punctate keratitis, left eye: Secondary | ICD-10-CM | POA: Diagnosis not present

## 2022-09-07 DIAGNOSIS — H401133 Primary open-angle glaucoma, bilateral, severe stage: Secondary | ICD-10-CM | POA: Diagnosis not present

## 2022-09-07 DIAGNOSIS — E271 Primary adrenocortical insufficiency: Secondary | ICD-10-CM | POA: Diagnosis not present

## 2022-09-07 DIAGNOSIS — H02886 Meibomian gland dysfunction of left eye, unspecified eyelid: Secondary | ICD-10-CM | POA: Diagnosis not present

## 2022-09-07 DIAGNOSIS — Z48810 Encounter for surgical aftercare following surgery on the sense organs: Secondary | ICD-10-CM | POA: Diagnosis not present

## 2022-09-07 DIAGNOSIS — Z961 Presence of intraocular lens: Secondary | ICD-10-CM | POA: Diagnosis not present

## 2022-09-07 DIAGNOSIS — H18413 Arcus senilis, bilateral: Secondary | ICD-10-CM | POA: Diagnosis not present

## 2022-09-07 DIAGNOSIS — H02883 Meibomian gland dysfunction of right eye, unspecified eyelid: Secondary | ICD-10-CM | POA: Diagnosis not present

## 2022-09-19 ENCOUNTER — Ambulatory Visit (INDEPENDENT_AMBULATORY_CARE_PROVIDER_SITE_OTHER): Payer: Medicare PPO | Admitting: *Deleted

## 2022-09-19 DIAGNOSIS — Z0000 Encounter for general adult medical examination without abnormal findings: Secondary | ICD-10-CM | POA: Diagnosis not present

## 2022-09-19 NOTE — Patient Instructions (Signed)
Mr. Darren Barnes , Thank you for taking time to come for your Medicare Wellness Visit. I appreciate your ongoing commitment to your health goals. Please review the following plan we discussed and let me know if I can assist you in the future.   These are the goals we discussed:  Goals      Exercise 3x per week (30 min per time)     Had eye surgery recently so has not been exercising        This is a list of the screening recommended for you and due dates:  Health Maintenance  Topic Date Due   COVID-19 Vaccine (6 - Moderna series) 04/25/2022   Tetanus Vaccine  05/20/2024   Pneumonia Vaccine  Completed   Flu Shot  Completed   Zoster (Shingles) Vaccine  Completed   HPV Vaccine  Aged Out     Next appointment: Follow up in one year for your annual wellness visit.   Preventive Care 31 Years and Older, Male Preventive care refers to lifestyle choices and visits with your health care provider that can promote health and wellness. What does preventive care include? A yearly physical exam. This is also called an annual well check. Dental exams once or twice a year. Routine eye exams. Ask your health care provider how often you should have your eyes checked. Personal lifestyle choices, including: Daily care of your teeth and gums. Regular physical activity. Eating a healthy diet. Avoiding tobacco and drug use. Limiting alcohol use. Practicing safe sex. Taking low doses of aspirin every day. Taking vitamin and mineral supplements as recommended by your health care provider. What happens during an annual well check? The services and screenings done by your health care provider during your annual well check will depend on your age, overall health, lifestyle risk factors, and family history of disease. Counseling  Your health care provider may ask you questions about your: Alcohol use. Tobacco use. Drug use. Emotional well-being. Home and relationship well-being. Sexual  activity. Eating habits. History of falls. Memory and ability to understand (cognition). Work and work Statistician. Screening  You may have the following tests or measurements: Height, weight, and BMI. Blood pressure. Lipid and cholesterol levels. These may be checked every 5 years, or more frequently if you are over 54 years old. Skin check. Lung cancer screening. You may have this screening every year starting at age 88 if you have a 30-pack-year history of smoking and currently smoke or have quit within the past 15 years. Fecal occult blood test (FOBT) of the stool. You may have this test every year starting at age 23. Flexible sigmoidoscopy or colonoscopy. You may have a sigmoidoscopy every 5 years or a colonoscopy every 10 years starting at age 86. Prostate cancer screening. Recommendations will vary depending on your family history and other risks. Hepatitis C blood test. Hepatitis B blood test. Sexually transmitted disease (STD) testing. Diabetes screening. This is done by checking your blood sugar (glucose) after you have not eaten for a while (fasting). You may have this done every 1-3 years. Abdominal aortic aneurysm (AAA) screening. You may need this if you are a current or former smoker. Osteoporosis. You may be screened starting at age 2 if you are at high risk. Talk with your health care provider about your test results, treatment options, and if necessary, the need for more tests. Vaccines  Your health care provider may recommend certain vaccines, such as: Influenza vaccine. This is recommended every year. Tetanus, diphtheria, and  acellular pertussis (Tdap, Td) vaccine. You may need a Td booster every 10 years. Zoster vaccine. You may need this after age 31. Pneumococcal 13-valent conjugate (PCV13) vaccine. One dose is recommended after age 6. Pneumococcal polysaccharide (PPSV23) vaccine. One dose is recommended after age 50. Talk to your health care provider about which  screenings and vaccines you need and how often you need them. This information is not intended to replace advice given to you by your health care provider. Make sure you discuss any questions you have with your health care provider. Document Released: 01/06/2016 Document Revised: 08/29/2016 Document Reviewed: 10/11/2015 Elsevier Interactive Patient Education  2017 [place] Prevention in the Home Falls can cause injuries. They can happen to people of all ages. There are many things you can do to make your home safe and to help prevent falls. What can I do on the outside of my home? Regularly fix the edges of walkways and driveways and fix any cracks. Remove anything that might make you trip as you walk through a door, such as a raised step or threshold. Trim any bushes or trees on the path to your home. Use bright outdoor lighting. Clear any walking paths of anything that might make someone trip, such as rocks or tools. Regularly check to see if handrails are loose or broken. Make sure that both sides of any steps have handrails. Any raised decks and porches should have guardrails on the edges. Have any leaves, snow, or ice cleared regularly. Use sand or salt on walking paths during winter. Clean up any spills in your garage right away. This includes oil or grease spills. What can I do in the bathroom? Use night lights. Install grab bars by the toilet and in the tub and shower. Do not use towel bars as grab bars. Use non-skid mats or decals in the tub or shower. If you need to sit down in the shower, use a plastic, non-slip stool. Keep the floor dry. Clean up any water that spills on the floor as soon as it happens. Remove soap buildup in the tub or shower regularly. Attach bath mats securely with double-sided non-slip rug tape. Do not have throw rugs and other things on the floor that can make you trip. What can I do in the bedroom? Use night lights. Make sure that you have a  light by your bed that is easy to reach. Do not use any sheets or blankets that are too big for your bed. They should not hang down onto the floor. Have a firm chair that has side arms. You can use this for support while you get dressed. Do not have throw rugs and other things on the floor that can make you trip. What can I do in the kitchen? Clean up any spills right away. Avoid walking on wet floors. Keep items that you use a lot in easy-to-reach places. If you need to reach something above you, use a strong step stool that has a grab bar. Keep electrical cords out of the way. Do not use floor polish or wax that makes floors slippery. If you must use wax, use non-skid floor wax. Do not have throw rugs and other things on the floor that can make you trip. What can I do with my stairs? Do not leave any items on the stairs. Make sure that there are handrails on both sides of the stairs and use them. Fix handrails that are broken or loose. Make sure that  handrails are as long as the stairways. Check any carpeting to make sure that it is firmly attached to the stairs. Fix any carpet that is loose or worn. Avoid having throw rugs at the top or bottom of the stairs. If you do have throw rugs, attach them to the floor with carpet tape. Make sure that you have a light switch at the top of the stairs and the bottom of the stairs. If you do not have them, ask someone to add them for you. What else can I do to help prevent falls? Wear shoes that: Do not have high heels. Have rubber bottoms. Are comfortable and fit you well. Are closed at the toe. Do not wear sandals. If you use a stepladder: Make sure that it is fully opened. Do not climb a closed stepladder. Make sure that both sides of the stepladder are locked into place. Ask someone to hold it for you, if possible. Clearly mark and make sure that you can see: Any grab bars or handrails. First and last steps. Where the edge of each step  is. Use tools that help you move around (mobility aids) if they are needed. These include: Canes. Walkers. Scooters. Crutches. Turn on the lights when you go into a dark area. Replace any light bulbs as soon as they burn out. Set up your furniture so you have a clear path. Avoid moving your furniture around. If any of your floors are uneven, fix them. If there are any pets around you, be aware of where they are. Review your medicines with your doctor. Some medicines can make you feel dizzy. This can increase your chance of falling. Ask your doctor what other things that you can do to help prevent falls. This information is not intended to replace advice given to you by your health care provider. Make sure you discuss any questions you have with your health care provider. Document Released: 10/06/2009 Document Revised: 05/17/2016 Document Reviewed: 01/14/2015 Elsevier Interactive Patient Education  2017 [employer].

## 2022-09-19 NOTE — Progress Notes (Addendum)
Subjective:   Darren Barnes is a 81 y.o. male who presents for Medicare Annual/Subsequent preventive examination.  I connected with  Darren Barnes on 09/19/22 by a audio enabled telemedicine application and verified that I am speaking with the correct person using two identifiers.  Patient Location: Home  Provider Location: Office/Clinic  I discussed the limitations of evaluation and management by telemedicine. The patient expressed understanding and agreed to proceed.   Review of Systems    Defer to PCP Cardiac Risk Factors include: advanced age (>60mn, >>66women);dyslipidemia;hypertension;male gender     Objective:    There were no vitals filed for this visit. There is no height or weight on file to calculate BMI.     09/19/2022    8:25 AM 09/16/2021    8:05 AM 09/01/2020    7:38 AM 05/23/2020   11:34 AM 02/02/2019    3:33 PM 07/11/2017    9:09 AM  Advanced Directives  Does Patient Have a Medical Advance Directive? Yes Yes Yes No No Yes  Type of [place]    Living will  Does patient want to make changes to medical advance directive? No - Patient declined  No - Patient declined     Copy of [place] Chart? No - copy requested No - copy requested      Would patient like information on creating a medical advance directive?    No - Patient declined      Current Medications (verified) Outpatient Encounter Medications as of 09/19/2022  Medication Sig   aspirin EC 81 MG tablet Take 81 mg by mouth daily. Swallow whole.   brimonidine (ALPHAGAN) 0.2 % ophthalmic solution Place 1 drop into both eyes 2 times daily.   diltiazem (CARDIZEM CD) 240 MG 24 hr capsule TAKE 1 CAPSULE(240 MG) BY MOUTH IN THE MORNING AND AT BEDTIME   dorzolamide-timolol (COSOPT) 22.3-6.8 MG/ML ophthalmic solution Place 1 drop into both eyes 2 (two) times daily.   isosorbide mononitrate (IMDUR) 30 MG 24  hr tablet TAKE 1/2 TABLET(15 MG) BY MOUTH DAILY   ketorolac (ACULAR) 0.5 % ophthalmic solution Place 1 drop into the left eye in the morning, at noon, and at bedtime.   latanoprost (XALATAN) 0.005 % ophthalmic solution Place 1 drop into both eyes at bedtime.   methazolamide (NEPTAZANE) 50 MG tablet Take 1 tablet (50 mg total) by mouth 3 (three) times daily.   multivitamin (THERAGRAN) per tablet Take 1 tablet by mouth daily.   nitroGLYCERIN (NITROSTAT) 0.4 MG SL tablet Place 1 tablet (0.4 mg total) under the tongue every 5 (five) minutes x 3 doses as needed for chest pain.   rosuvastatin (CRESTOR) 10 MG tablet TAKE 1 TABLET(10 MG) BY MOUTH DAILY   tamsulosin (FLOMAX) 0.4 MG CAPS capsule TAKE 1 CAPSULE(0.4 MG) BY MOUTH IN THE MORNING AND AT BEDTIME   telmisartan (MICARDIS) 40 MG tablet TAKE 1 TABLET BY MOUTH EVERY DAY   zolpidem (AMBIEN) 5 MG tablet TAKE 1 TABLET(5 MG) BY MOUTH AT BEDTIME AS NEEDED FOR SLEEP   No facility-administered encounter medications on file as of 09/19/2022.    Allergies (verified) Patient has no known allergies.   History: Past Medical History:  Diagnosis Date   Cataract    ED (erectile dysfunction)    Glaucoma    both eyes   Hx of adenomatous colonic polyps 07/30/2017   Hyperlipidemia    Hypertension    Left inguinal  hernia    Nocturia    saw urology 2012, was eval, rx vesicare (after several other meds failed)   Osteoarthritis    Sleep apnea    no cpap used could not tolerate, mild osa per sleep study   Thrombocytopenia ([place])    saw hematology, observe   Past Surgical History:  Procedure Laterality Date   anal polyp resection  2008   Dr Debbora Presto   Benign tumor from Left arm  yrs ago   colonscopy  08/01/2020   polyps removed   EYE SURGERY  02/22/2020   right eye sx for glaucoma   INGUINAL HERNIA REPAIR Left 09/01/2020   Procedure: OPEN LEFT INGUINAL HERNIA REPAIR WITH MESH;  Surgeon: Coralie Keens, MD;  Location: [facility];  Service:  General;  Laterality: Left;   KNEE ARTHROSCOPY  2004   Sinus surgery for benign tumor  2000   stem cell injections to both knees  2017   Family History  Problem Relation Age of Onset   Coronary artery disease Father 57       MI at 28   Hypertension Mother        M and F    Diabetes Mother    Stroke Mother 18   Colon cancer Neg Hx    Prostate cancer Neg Hx    Esophageal cancer Neg Hx    Pancreatic cancer Neg Hx    Rectal cancer Neg Hx    Stomach cancer Neg Hx    Social History   Socioeconomic History   Marital status: Married    Spouse name: Not on file   Number of children: 3   Years of education: Not on file   Highest education level: Not on file  Occupational History   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  Tobacco Use   Smoking status: Former    Packs/day: 0.50    Years: 5.00    Total pack years: 2.50    Types: Cigarettes    Quit date: 12/24/1958    Years since quitting: 63.7   Smokeless tobacco: Never  Vaping Use   Vaping Use: Never used  Substance and Sexual Activity   Alcohol use: Yes    Comment: socially    Drug use: No   Sexual activity: Not on file  Other Topics Concern   Not on file  Social History Narrative   Lives w/ wife   Lost one son, 2 living children, 8 g-kids            Social Determinants of Health   Financial Resource Strain: Low Risk  (09/16/2021)   Overall Financial Resource Strain (CARDIA)    Difficulty of Paying Living Expenses: Not hard at all  Food Insecurity: No Food Insecurity (09/16/2021)   Hunger Vital Sign    Worried About Running Out of Food in the Last Year: Never true    Ran Out of Food in the Last Year: Never true  Transportation Needs: No Transportation Needs (09/16/2021)   PRAPARE - Hydrologist (Medical): No    Lack of Transportation (Non-Medical): No  Physical Activity: Insufficiently Active (09/16/2021)   Exercise Vital Sign    Days of Exercise per Week: 7 days    Minutes  of Exercise per Session: 10 min  Stress: No Stress Concern Present (09/16/2021)   [place]    Feeling of Stress : Not at all  Social Connections: Socially Integrated (09/16/2021)   Social Connection and Isolation Panel [NHANES]    Frequency of Communication with Friends and Family: More than three times a week    Frequency of Social Gatherings with Friends and Family: Twice a week    Attends Religious Services: 1 to 4 times per year    Active Member of [employer] or Organizations: Yes    Attends Archivist Meetings: 1 to 4 times per year    Marital Status: Married    Tobacco Counseling Counseling given: Not Answered   Clinical Intake:  Pre-visit preparation completed: Yes  Pain : No/denies pain     Diabetes: No  How often do you need to have someone help you when you read instructions, pamphlets, or other written materials from your doctor or pharmacy?: 1 - Never  Diabetic? No  Activities of Daily Living    09/19/2022    8:29 AM  In your present state of health, do you have any difficulty performing the following activities:  Hearing? 0  Vision? 1  Comment glaucoma  Difficulty concentrating or making decisions? 0  Walking or climbing stairs? 0  Dressing or bathing? 0  Doing errands, shopping? 0  Preparing Food and eating ? N  Using the Toilet? N  In the past six months, have you accidently leaked urine? Y  Do you have problems with loss of bowel control? N  Managing your Medications? N  Managing your Finances? N  Housekeeping or managing your Housekeeping? N    Patient Care Team: Colon Branch, MD as PCP - General (Internal Medicine) Werner Lean, MD as PCP - Cardiology (Cardiology) Rigoberto Noel, MD as Attending Physician (Pulmonary Disease) Renard Hamper Marguerita Merles as Physician Assistant (Dermatology) Marlowe Sax, MD as Referring Physician (Ophthalmology)  Indicate any  recent Medical Services you may have received from other than [facility] providers in the past year (date may be approximate).     Assessment:   This is a routine wellness examination for Rea.  Hearing/Vision screen No results found.  Dietary issues and exercise activities discussed: Current Exercise Habits: The patient does not participate in regular exercise at present, Exercise limited by: Other - see comments (recent eye surgery)   Goals Addressed             This Visit's Progress    Exercise 3x per week (30 min per time)   Not on track    Had eye surgery recently so has not been exercising       Depression Screen    09/19/2022    8:29 AM 07/11/2022   10:32 AM 01/02/2022    8:29 AM 09/16/2021    8:05 AM 06/28/2021    8:00 AM 01/31/2021    2:32 PM 06/21/2020    8:42 AM  PHQ 2/9 Scores  PHQ - 2 Score 0 5 0 0 0 0 0  PHQ- 9 Score  20         Fall Risk    09/19/2022    8:28 AM 07/11/2022   10:10 AM 01/02/2022    8:29 AM 09/16/2021    8:10 AM 06/28/2021    8:00 AM  Fall Risk   Falls in the past year? 0 0 0 0 0  Number falls in past yr: 0 0 0 0 0  Injury with Fall? 0 0 0 0 0  Risk for fall due to : No Fall Risks   No Fall Risks   Follow  up Falls evaluation completed Falls evaluation completed Falls evaluation completed Falls prevention discussed Falls evaluation completed    FALL RISK PREVENTION PERTAINING TO THE HOME:  Any stairs in or around the home? Yes  If so, are there any without handrails? No  Home free of loose throw rugs in walkways, pet beds, electrical cords, etc? Yes  Adequate lighting in your home to reduce risk of falls? Yes   ASSISTIVE DEVICES UTILIZED TO PREVENT FALLS:  Life alert? No  Use of a cane, walker or w/c? No  Grab bars in the bathroom? Yes  Shower chair or bench in shower? No  Elevated toilet seat or a handicapped toilet? No   TIMED UP AND GO:  Was the test performed? No . Audio visit  Cognitive Function:        09/19/2022    8:34 AM  09/16/2021    8:09 AM  6CIT Screen  What Year? 0 points 0 points  What month? 0 points 0 points  What time? 0 points 0 points  Count back from 20 4 points 0 points  Months in reverse 2 points 0 points  Repeat phrase 0 points 6 points  Total Score 6 points 6 points    Immunizations Immunization History  Administered Date(s) Administered   Fluad Quad(high Dose 65+) 09/18/2022   Influenza Split 09/23/2021   Influenza Whole 09/23/2012   Influenza, High Dose Seasonal PF 08/24/2013, 09/19/2019   Influenza-Unspecified 10/14/2015, 09/17/2016, 09/20/2017   Moderna Covid-19 Vaccine Bivalent Booster 70yr & up 12/26/2021   Moderna Sars-Covid-2 Vaccination 01/04/2020, 02/01/2020, 10/31/2020, 02/22/2021   Pneumococcal Conjugate-13 05/20/2014, 09/11/2017   Pneumococcal Polysaccharide-23 04/17/2004, 05/03/2009, 06/28/2021   Td 04/17/2004   Tetanus 05/20/2014   Zoster Recombinat (Shingrix) 07/03/2019, 09/03/2019   Zoster, Live 05/10/2009    TDAP status: Up to date  Flu Vaccine status: Up to date  Pneumococcal vaccine status: Up to date  Covid-19 vaccine status: Information provided on how to obtain vaccines.   Qualifies for Shingles Vaccine? Yes   Zostavax completed Yes   Shingrix Completed?: Yes  Screening Tests Health Maintenance  Topic Date Due   COVID-19 Vaccine (6 - Moderna series) 04/25/2022   TETANUS/TDAP  05/20/2024   Pneumonia Vaccine 81 Years old  Completed   INFLUENZA VACCINE  Completed   Zoster Vaccines- Shingrix  Completed   HPV VACCINES  Aged Out    Health Maintenance  Health Maintenance Due  Topic Date Due   COVID-19 Vaccine (6 - Moderna series) 04/25/2022    Colorectal cancer screening: No longer required.   Lung Cancer Screening: (Low Dose CT Chest recommended if Age 453-80years, 30 pack-year currently smoking OR have quit w/in 15years.) does not qualify.   Lung Cancer Screening Referral: N/a  Additional Screening:  Hepatitis C Screening: does not  qualify; Completed N/a  Vision Screening: Recommended annual ophthalmology exams for early detection of glaucoma and other disorders of the eye. Is the patient up to date with their annual eye exam?  Yes  Who is the provider or what is the name of the office in which the patient attends annual eye exams? Dr. TFrancee NodalIf pt is not established with a provider, would they like to be referred to a provider to establish care? No .   Dental Screening: Recommended annual dental exams for proper oral hygiene  Community Resource Referral / Chronic Care Management: CRR required this visit?  No   CCM required this visit?  No      Plan:  I have personally reviewed and noted the following in the patient's chart:   Medical and social history Use of alcohol, tobacco or illicit drugs  Current medications and supplements including opioid prescriptions. Patient is not currently taking opioid prescriptions. Functional ability and status Nutritional status Physical activity Advanced directives List of other physicians Hospitalizations, surgeries, and ER visits in previous 12 months Vitals Screenings to include cognitive, depression, and falls Referrals and appointments  In addition, I have reviewed and discussed with patient certain preventive protocols, quality metrics, and best practice recommendations. A written personalized care plan for preventive services as well as general preventive health recommendations were provided to patient.   Due to this being a telephonic visit, the after visit summary with patients personalized plan was offered to patient via mail or my-chart.  Patient would like to access on my-chart and was mailed a copy of AVS.  Beatris Ship, [place]   09/19/2022   Nurse Notes: None   I have reviewed and agree with [facility] documentation.  Kathlene November, MD

## 2022-10-09 ENCOUNTER — Other Ambulatory Visit: Payer: Self-pay | Admitting: Internal Medicine

## 2022-10-26 DIAGNOSIS — H04123 Dry eye syndrome of bilateral lacrimal glands: Secondary | ICD-10-CM | POA: Diagnosis not present

## 2022-10-26 DIAGNOSIS — Z961 Presence of intraocular lens: Secondary | ICD-10-CM | POA: Diagnosis not present

## 2022-10-26 DIAGNOSIS — H209 Unspecified iridocyclitis: Secondary | ICD-10-CM | POA: Diagnosis not present

## 2022-10-26 DIAGNOSIS — H401133 Primary open-angle glaucoma, bilateral, severe stage: Secondary | ICD-10-CM | POA: Diagnosis not present

## 2022-11-01 ENCOUNTER — Ambulatory Visit (INDEPENDENT_AMBULATORY_CARE_PROVIDER_SITE_OTHER): Payer: Medicare PPO | Admitting: Internal Medicine

## 2022-11-01 ENCOUNTER — Encounter: Payer: Self-pay | Admitting: Internal Medicine

## 2022-11-01 VITALS — BP 126/72 | HR 52 | Temp 97.9°F | Resp 16 | Ht 69.0 in | Wt 174.5 lb

## 2022-11-01 DIAGNOSIS — I10 Essential (primary) hypertension: Secondary | ICD-10-CM

## 2022-11-01 DIAGNOSIS — Z0000 Encounter for general adult medical examination without abnormal findings: Secondary | ICD-10-CM

## 2022-11-01 DIAGNOSIS — E7849 Other hyperlipidemia: Secondary | ICD-10-CM

## 2022-11-01 LAB — CBC WITH DIFFERENTIAL/PLATELET
Basophils Absolute: 0 10*3/uL (ref 0.0–0.1)
Basophils Relative: 0.3 % (ref 0.0–3.0)
Eosinophils Absolute: 0.3 10*3/uL (ref 0.0–0.7)
Eosinophils Relative: 4.2 % (ref 0.0–5.0)
HCT: 42.9 % (ref 39.0–52.0)
Hemoglobin: 13.8 g/dL (ref 13.0–17.0)
Lymphocytes Relative: 16.9 % (ref 12.0–46.0)
Lymphs Abs: 1.3 10*3/uL (ref 0.7–4.0)
MCHC: 32.1 g/dL (ref 30.0–36.0)
MCV: 86.7 fl (ref 78.0–100.0)
Monocytes Absolute: 0.7 10*3/uL (ref 0.1–1.0)
Monocytes Relative: 9.7 % (ref 3.0–12.0)
Neutro Abs: 5.3 10*3/uL (ref 1.4–7.7)
Neutrophils Relative %: 68.9 % (ref 43.0–77.0)
Platelets: 78 10*3/uL — ABNORMAL LOW (ref 150.0–400.0)
RBC: 4.95 Mil/uL (ref 4.22–5.81)
RDW: 12.9 % (ref 11.5–15.5)
WBC: 7.7 10*3/uL (ref 4.0–10.5)

## 2022-11-01 LAB — BASIC METABOLIC PANEL
BUN: 15 mg/dL (ref 6–23)
CO2: 26 mEq/L (ref 19–32)
Calcium: 8.9 mg/dL (ref 8.4–10.5)
Chloride: 106 mEq/L (ref 96–112)
Creatinine, Ser: 1.02 mg/dL (ref 0.40–1.50)
GFR: 67.88 mL/min (ref >60.00)
Glucose, Bld: 100 mg/dL — ABNORMAL HIGH (ref 70–99)
Potassium: 4.1 mEq/L (ref 3.5–5.1)
Sodium: 138 mEq/L (ref 135–145)

## 2022-11-01 LAB — TSH: TSH: 2.22 u[IU]/mL (ref 0.35–5.50)

## 2022-11-01 MED ORDER — ZOLPIDEM TARTRATE ER 6.25 MG PO TBCR: 6.2500 mg | EXTENDED_RELEASE_TABLET | Freq: Every evening | ORAL | 2 refills | Status: DC | PRN

## 2022-11-01 NOTE — Patient Instructions (Addendum)
Vaccines I recommend:  Covid booster RSV vaccine  Change to Ambien CR 6.25 mg 1 tablet at bedtime.  HEALTHY SLEEP Sleep hygiene: Basic rules for a good night's sleep  Sleep only as much as you need to feel rested and then get out of bed  Keep a regular sleep schedule  Avoid forcing sleep  Exercise regularly for at least 20 minutes, preferably 4 to 5 hours before bedtime  Avoid caffeinated beverages after lunch  Avoid alcohol near bedtime: no "night cap"  Avoid smoking, especially in the evening  Do not go to bed hungry  Adjust bedroom environment  Avoid prolonged use of light-emitting screens before bedtime   Deal with your worries before bedtime    GO TO THE LAB : Get the blood work     [place], [place] back for a checkup in 3 months    Do you have a "Living will" or "[place] of attorney"? (Advance care planning documents)  If you already have a living will or healthcare power of attorney, is recommended you bring the copy to be scanned in your chart. The document will be available to all the doctors you see in the system.  If you don't have one, please consider create one.  AMore information at:  [email or web address]    Fall Prevention in the Home, Adult Falls can cause injuries and affect people of all ages. There are many simple things that you can do to make your home safe and to help prevent falls. Ask for help when making these changes, if needed. What actions can I take to prevent falls? General instructions Use good lighting in all rooms. Replace any light bulbs that burn out, turn on lights if it is dark, and use night-lights. Place frequently used items in easy-to-reach places. Lower the shelves around your home if necessary. Set up furniture so that there are clear paths around it. Avoid moving your furniture around. Remove throw rugs and other tripping hazards from the floor. Avoid walking on  wet floors. Fix any uneven floor surfaces. Add color or contrast paint or tape to grab bars and handrails in your home. Place contrasting color strips on the first and last steps of staircases. When you use a stepladder, make sure that it is completely opened and that the sides and supports are firmly locked. Have someone hold the ladder while you are using it. Do not climb a closed stepladder. Know where your pets are when moving through your home. What can I do in the bathroom?     Keep the floor dry. Immediately clean up any water that is on the floor. Remove soap buildup in the tub or shower regularly. Use nonskid mats or decals on the floor of the tub or shower. Attach bath mats securely with double-sided, nonslip rug tape. If you need to sit down while you are in the shower, use a plastic, nonslip stool. Install grab bars by the toilet and in the tub and shower. Do not use towel bars as grab bars. What can I do in the bedroom? Make sure that a bedside light is easy to reach. Do not use oversized bedding that reaches the floor. Have a firm chair that has side arms to use for getting dressed. What can I do in the kitchen? Clean up any spills right away. If you need to reach for something above you, use a sturdy step stool that has a grab bar. Keep  electrical cables out of the way. Do not use floor polish or wax that makes floors slippery. If you must use wax, make sure that it is non-skid floor wax. What can I do with my stairs? Do not leave any items on the stairs. Make sure that you have a light switch at the top and the bottom of the stairs. Have them installed if you do not have them. Make sure that there are handrails on both sides of the stairs. Fix handrails that are broken or loose. Make sure that handrails are as long as the staircases. Install non-slip stair treads on all stairs in your home. Avoid having throw rugs at the top or bottom of stairs, or secure the rugs with  carpet tape to prevent them from moving. Choose a carpet design that does not hide the edge of steps on the stairs. Check any carpeting to make sure that it is firmly attached to the stairs. Fix any carpet that is loose or worn. What can I do on the outside of my home? Use bright outdoor lighting. Regularly repair the edges of walkways and driveways and fix any cracks. Remove high doorway thresholds. Trim any shrubbery on the main path into your home. Regularly check that handrails are securely fastened and in good repair. Both sides of all steps should have handrails. Install guardrails along the edges of any raised decks or porches. Clear walkways of debris and clutter, including tools and rocks. Have leaves, snow, and ice cleared regularly. Use sand or salt on walkways during winter months. In the garage, clean up any spills right away, including grease or oil spills. What other actions can I take? Wear closed-toe shoes that fit well and support your feet. Wear shoes that have rubber soles or low heels. Use mobility aids as needed, such as canes, walkers, scooters, and crutches. Review your medicines with your health care provider. Some medicines can cause dizziness or changes in blood pressure, which increase your risk of falling. Talk with your health care provider about other ways that you can decrease your risk of falls. This may include working with a physical therapist or trainer to improve your strength, balance, and endurance. Where to find more information Centers for Disease Control and Prevention, [employer]: http://www.wolf.info/ National Institute on Aging: http://kim-miller.com/ Contact a health care provider if: You are afraid of falling at home. You feel weak, drowsy, or dizzy at home. You fall at home. Summary There are many simple things that you can do to make your home safe and to help prevent falls. Ways to make your home safe include removing tripping hazards and installing grab bars  in the bathroom. Ask for help when making these changes in your home. This information is not intended to replace advice given to you by your health care provider. Make sure you discuss any questions you have with your health care provider. Document Revised: 09/11/2021 Document Reviewed: 07/13/2020 Elsevier Patient Education  [place].

## 2022-11-01 NOTE — Progress Notes (Signed)
Subjective:    Patient ID: Darren Barnes, male    DOB: 1940-12-27, 81 y.o.   MRN: [id number]  DOS:  11/01/2022 Type of visit - description: CPX, here with his wife  Here for CPX.  Doing okay. He has poor vision, see last visit, he was feeling somewhat depressed about it. Today she reports he is "okay".  Denies suicidal ideas, no major problems with somnolence or withdrawal from family or friends Still has nocturia, few times a night, states that is not bothersome to him. Insomnia: Stronger Ambien?.  Review of Systems  Other than above, a 14 point review of systems is negative    Past Medical History:  Diagnosis Date   Cataract    ED (erectile dysfunction)    Glaucoma    both eyes   Hx of adenomatous colonic polyps 07/30/2017   Hyperlipidemia    Hypertension    Left inguinal hernia    Nocturia    saw urology 2012, was eval, rx vesicare (after several other meds failed)   Osteoarthritis    Sleep apnea    no cpap used could not tolerate, mild osa per sleep study   Thrombocytopenia ([place])    saw hematology, observe    Past Surgical History:  Procedure Laterality Date   anal polyp resection  2008   Dr Debbora Presto   Benign tumor from Left arm  yrs ago   colonscopy  08/01/2020   polyps removed   EYE SURGERY  02/22/2020   right eye sx for glaucoma   INGUINAL HERNIA REPAIR Left 09/01/2020   Procedure: OPEN LEFT INGUINAL HERNIA REPAIR WITH MESH;  Surgeon: Coralie Keens, MD;  Location: [facility];  Service: General;  Laterality: Left;   KNEE ARTHROSCOPY  2004   Sinus surgery for benign tumor  2000   stem cell injections to both knees  2017   Social History   Socioeconomic History   Marital status: Married    Spouse name: Not on file   Number of children: 3   Years of education: Not on file   Highest education level: Not on file  Occupational History   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  Tobacco Use   Smoking status: Former    Packs/day:  0.50    Years: 5.00    Total pack years: 2.50    Types: Cigarettes    Quit date: 12/24/1958    Years since quitting: 63.9   Smokeless tobacco: Never  Vaping Use   Vaping Use: Never used  Substance and Sexual Activity   Alcohol use: Yes    Comment: socially    Drug use: No   Sexual activity: Not on file  Other Topics Concern   Not on file  Social History Narrative   Lives w/ wife   Lost one son, 2 living children, 8 g-kids            Social Determinants of Health   Financial Resource Strain: [place]  (09/16/2021)   Overall Financial Resource Strain (CARDIA)    Difficulty of Paying Living Expenses: Not hard at all  Food Insecurity: No [place] (09/16/2021)   Hunger Vital Sign    Worried About Running Out of Food in the Last Year: Never true    [place] in the Last Year: Never true  Transportation Needs: No Transportation Needs (09/16/2021)   PRAPARE - Transportation    Lack of Transportation (Medical): No    Lack  of Transportation (Non-Medical): No  Physical Activity: Insufficiently Active (09/16/2021)   Exercise Vital Sign    Days of Exercise per Week: 7 days    Minutes of Exercise per Session: 10 min  Stress: No Stress Concern Present (09/16/2021)   [place]    Feeling of Stress : Not at all  Social Connections: [place] (09/16/2021)   Social Connection and Isolation Panel [NHANES]    Frequency of Communication with Friends and Family: More than three times a week    Frequency of Social Gatherings with Friends and Family: Twice a week    Attends Religious Services: 1 to 4 times per year    Active Member of [employer] or Organizations: Yes    Attends Archivist Meetings: 1 to 4 times per year    Marital Status: Married  Human resources officer Violence: Not At Risk (09/16/2021)   Humiliation, Afraid, Rape, and Kick questionnaire    Fear of Current or Ex-Partner: No    Emotionally  Abused: No    Physically Abused: No    Sexually Abused: No     Current Outpatient Medications  Medication Instructions   aspirin EC 81 mg, Oral, Daily, Swallow whole.   brimonidine (ALPHAGAN) 0.2 % ophthalmic solution Place 1 drop into both eyes 2 times daily.   diltiazem (CARDIZEM CD) 240 MG 24 hr capsule TAKE 1 CAPSULE(240 MG) BY MOUTH IN THE MORNING AND AT BEDTIME   dorzolamide-timolol (COSOPT) 22.3-6.8 MG/ML ophthalmic solution 1 drop, Both Eyes, 2 times daily   isosorbide mononitrate (IMDUR) 30 MG 24 hr tablet TAKE 1/2 TABLET(15 MG) BY MOUTH DAILY   ketorolac (ACULAR) 0.5 % ophthalmic solution 1 drop, Left Eye, 2 times daily   latanoprost (XALATAN) 0.005 % ophthalmic solution 1 drop, Both Eyes, Daily at bedtime   methazolamide (NEPTAZANE) 50 mg, Oral, 3 times daily   multivitamin (THERAGRAN) per tablet 1 tablet, Oral, Daily,     nitroGLYCERIN (NITROSTAT) 0.4 mg, Sublingual, Every 5 min x3 PRN   rosuvastatin (CRESTOR) 10 MG tablet TAKE 1 TABLET(10 MG) BY MOUTH DAILY   tamsulosin (FLOMAX) 0.4 mg, Oral, 2 times daily   telmisartan (MICARDIS) 40 MG tablet TAKE 1 TABLET BY MOUTH EVERY DAY   zolpidem (AMBIEN CR) 6.25 mg, Oral, At bedtime PRN       Objective:   Physical Exam BP 126/72   Pulse (!) 52   Temp 97.9 F (36.6 C) (Oral)   Resp 16   Ht '5\' 9"'$  (1.753 m)   Wt 174 lb 8 oz (79.2 kg)   SpO2 95%   BMI 25.77 kg/m  General: Well developed, NAD, BMI noted Neck: No  thyromegaly  HEENT:  Normocephalic . Face symmetric, atraumatic Lungs:  CTA B Normal respiratory effort, no intercostal retractions, no accessory muscle use. Heart: RRR,  no murmur.  Abdomen:  Not distended, soft, non-tender. No rebound or rigidity.   Lower extremities: no pretibial edema bilaterally  Skin: Exposed areas without rash. Not pale. Not jaundice Neurologic:  alert & oriented X3.  Speech normal, gait appropriate for age and unassisted.  Transfer is somewhat limited. Strength symmetric and  appropriate for age.  Psych: Cognition and judgment appear intact.  Cooperative with normal attention span and concentration.  Behavior appropriate. No anxious or depressed appearing.     Assessment     Assessment   HTN- on Cardizem for a while, added losartan 10-2015 Hyperlipidemia ( Lipitor .  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 Rx medical mgmt   PLAN: Here for CPX [id number] controlled on Cardizem, Imdur, Micardis.  Checking labs High cholesterol: Controlled per last FLP Glaucoma, poor vision: Follow-up by ophthalmology. Thrombocytopenia: Per chart review, checking a CBC. LUTS: Still has nocturia, at baseline, on Flomax, not interested in further eval. CAD: Continue aspirin, Imdur and CV RF control.  Reports no chest pain. Depression, insomnia: Mood is according to the patient "okay". Ambien 5 mg help only for a couple of hours, 10 mg works better.  Rec to change Ambien 5 mg to Ambien extended release.  See prescription. RTC 3 months

## 2022-11-02 NOTE — Assessment & Plan Note (Signed)
-  Td 2015 - PNM 23: 2005, 2010, 06-28-21;  prevnar 2015 - zostavax 2010;  s/p shingrex -COVID booster discussed. - RSV discussed with patient -Had a flu shot --CCS: Last C-scope 2019, no further screening per GI letter - Prostate cancer screening: No further screening  --Height decreased -->   DEXA 05-2014 normal --Diet exercise : counseled --Labs:  BMP CBC TSH - POA d/w pt - Fall prevention discussed

## 2022-11-02 NOTE — Assessment & Plan Note (Signed)
Here for CPX [id number] controlled on Cardizem, Imdur, Micardis.  Checking labs High cholesterol: Controlled per last FLP Glaucoma, poor vision: Follow-up by ophthalmology. Thrombocytopenia: Per chart review, checking a CBC. LUTS: Still has nocturia, at baseline, on Flomax, not interested in further eval. CAD: Continue aspirin, Imdur and CV RF control.  Reports no chest pain. Depression, insomnia: Mood is according to the patient "okay". Ambien 5 mg help only for a couple of hours, 10 mg works better.  Rec to change Ambien 5 mg to Ambien extended release.  See prescription. RTC 3 months

## 2022-11-05 DIAGNOSIS — Z961 Presence of intraocular lens: Secondary | ICD-10-CM | POA: Diagnosis not present

## 2022-11-05 DIAGNOSIS — Z79899 Other long term (current) drug therapy: Secondary | ICD-10-CM | POA: Diagnosis not present

## 2022-11-05 DIAGNOSIS — H401133 Primary open-angle glaucoma, bilateral, severe stage: Secondary | ICD-10-CM | POA: Diagnosis not present

## 2022-11-05 DIAGNOSIS — H04123 Dry eye syndrome of bilateral lacrimal glands: Secondary | ICD-10-CM | POA: Diagnosis not present

## 2022-11-05 DIAGNOSIS — H26492 Other secondary cataract, left eye: Secondary | ICD-10-CM | POA: Diagnosis not present

## 2022-12-13 DIAGNOSIS — H16143 Punctate keratitis, bilateral: Secondary | ICD-10-CM | POA: Diagnosis not present

## 2022-12-13 DIAGNOSIS — H547 Unspecified visual loss: Secondary | ICD-10-CM | POA: Diagnosis not present

## 2022-12-13 DIAGNOSIS — H5010 Unspecified exotropia: Secondary | ICD-10-CM | POA: Diagnosis not present

## 2022-12-13 DIAGNOSIS — H02883 Meibomian gland dysfunction of right eye, unspecified eyelid: Secondary | ICD-10-CM | POA: Diagnosis not present

## 2022-12-13 DIAGNOSIS — H2512 Age-related nuclear cataract, left eye: Secondary | ICD-10-CM | POA: Diagnosis not present

## 2022-12-13 DIAGNOSIS — H04123 Dry eye syndrome of bilateral lacrimal glands: Secondary | ICD-10-CM | POA: Diagnosis not present

## 2022-12-13 DIAGNOSIS — H401133 Primary open-angle glaucoma, bilateral, severe stage: Secondary | ICD-10-CM | POA: Diagnosis not present

## 2022-12-13 DIAGNOSIS — H47291 Other optic atrophy, right eye: Secondary | ICD-10-CM | POA: Diagnosis not present

## 2022-12-13 DIAGNOSIS — H02886 Meibomian gland dysfunction of left eye, unspecified eyelid: Secondary | ICD-10-CM | POA: Diagnosis not present

## 2022-12-13 DIAGNOSIS — Z961 Presence of intraocular lens: Secondary | ICD-10-CM | POA: Diagnosis not present

## 2022-12-13 DIAGNOSIS — H11131 Conjunctival pigmentations, right eye: Secondary | ICD-10-CM | POA: Diagnosis not present

## 2022-12-13 DIAGNOSIS — H50112 Monocular exotropia, left eye: Secondary | ICD-10-CM | POA: Diagnosis not present

## 2023-01-03 DIAGNOSIS — H16143 Punctate keratitis, bilateral: Secondary | ICD-10-CM | POA: Diagnosis not present

## 2023-01-03 DIAGNOSIS — H18413 Arcus senilis, bilateral: Secondary | ICD-10-CM | POA: Diagnosis not present

## 2023-01-03 DIAGNOSIS — H5010 Unspecified exotropia: Secondary | ICD-10-CM | POA: Diagnosis not present

## 2023-01-03 DIAGNOSIS — H02886 Meibomian gland dysfunction of left eye, unspecified eyelid: Secondary | ICD-10-CM | POA: Diagnosis not present

## 2023-01-03 DIAGNOSIS — H04123 Dry eye syndrome of bilateral lacrimal glands: Secondary | ICD-10-CM | POA: Diagnosis not present

## 2023-01-03 DIAGNOSIS — H02883 Meibomian gland dysfunction of right eye, unspecified eyelid: Secondary | ICD-10-CM | POA: Diagnosis not present

## 2023-01-03 DIAGNOSIS — H47291 Other optic atrophy, right eye: Secondary | ICD-10-CM | POA: Diagnosis not present

## 2023-01-03 DIAGNOSIS — Z961 Presence of intraocular lens: Secondary | ICD-10-CM | POA: Diagnosis not present

## 2023-01-03 DIAGNOSIS — H401133 Primary open-angle glaucoma, bilateral, severe stage: Secondary | ICD-10-CM | POA: Diagnosis not present

## 2023-01-13 ENCOUNTER — Other Ambulatory Visit: Payer: Self-pay | Admitting: Internal Medicine

## 2023-02-05 ENCOUNTER — Encounter: Payer: Self-pay | Admitting: Internal Medicine

## 2023-02-05 ENCOUNTER — Telehealth: Payer: Self-pay | Admitting: Internal Medicine

## 2023-02-05 ENCOUNTER — Ambulatory Visit: Payer: Medicare PPO | Admitting: Internal Medicine

## 2023-02-05 VITALS — BP 132/72 | HR 58 | Temp 98.0°F | Resp 16 | Ht 69.0 in | Wt 175.2 lb

## 2023-02-05 DIAGNOSIS — E7849 Other hyperlipidemia: Secondary | ICD-10-CM | POA: Diagnosis not present

## 2023-02-05 DIAGNOSIS — G4700 Insomnia, unspecified: Secondary | ICD-10-CM | POA: Diagnosis not present

## 2023-02-05 DIAGNOSIS — I10 Essential (primary) hypertension: Secondary | ICD-10-CM

## 2023-02-05 NOTE — Patient Instructions (Addendum)
I removed a medication from your list : Methazolamide If you realize you are indeed taking it please let me know vaccines I recommend:   Covid booster RSV vaccine  Please bring [place] a copy of your Healthcare Power of Attorney for your chart.      GO TO THE FRONT DESK, PLEASE SCHEDULE YOUR APPOINTMENTS Come back for a checkup in 3 to 4 months   HEALTHY SLEEP  Sleep hygiene: Basic rules for a good night's sleep  Sleep only as much as you need to feel rested and then get out of bed  Keep a regular sleep schedule  Avoid forcing sleep  Exercise regularly for at least 20 minutes, preferably 4 to 5 hours before bedtime  Avoid caffeinated beverages after lunch  Avoid alcohol near bedtime: no "night cap"  Avoid smoking, especially in the evening  Do not go to bed hungry  Adjust bedroom environment  Avoid prolonged use of light-emitting screens before bedtime   Deal with your worries before bedtime

## 2023-02-05 NOTE — Telephone Encounter (Signed)
Appt later today.

## 2023-02-05 NOTE — Telephone Encounter (Signed)
PDMP okay, Rx sent 

## 2023-02-05 NOTE — Telephone Encounter (Signed)
Requesting: Ambien 6.21m  Contract: Getting today UDS: None Last Visit: 11/01/22 Next Visit: 02/05/23 Last Refill: 11/01/22 #30 and 2RF   Please Advise

## 2023-02-05 NOTE — Progress Notes (Unsigned)
Subjective:    Patient ID: Darren Barnes, male    DOB: 01/03/1941, 82 y.o.   MRN: [id number]  DOS:  02/05/2023 Type of visit - description: f/u Here with his wife. In general feels well. Insomnia is better   Review of Systems See above   Past Medical History:  Diagnosis Date   Cataract    ED (erectile dysfunction)    Glaucoma    both eyes   Hx of adenomatous colonic polyps 07/30/2017   Hyperlipidemia    Hypertension    Left inguinal hernia    Nocturia    saw urology 2012, was eval, rx vesicare (after several other meds failed)   Osteoarthritis    Sleep apnea    no cpap used could not tolerate, mild osa per sleep study   Thrombocytopenia ([place])    saw hematology, observe    Past Surgical History:  Procedure Laterality Date   anal polyp resection  2008   Dr Debbora Presto   Benign tumor from Left arm  yrs ago   colonscopy  08/01/2020   polyps removed   EYE SURGERY  02/22/2020   right eye sx for glaucoma   INGUINAL HERNIA REPAIR Left 09/01/2020   Procedure: OPEN LEFT INGUINAL HERNIA REPAIR WITH MESH;  Surgeon: Coralie Keens, MD;  Location: [facility];  Service: General;  Laterality: Left;   KNEE ARTHROSCOPY  2004   Sinus surgery for benign tumor  2000   stem cell injections to both knees  2017    Current Outpatient Medications  Medication Instructions   aspirin EC 81 mg, Oral, Daily, Swallow whole.   brimonidine (ALPHAGAN) 0.2 % ophthalmic solution Place 1 drop into both eyes 2 times daily.   diltiazem (CARDIZEM CD) 240 MG 24 hr capsule TAKE 1 CAPSULE(240 MG) BY MOUTH IN THE MORNING AND AT BEDTIME   dorzolamide-timolol (COSOPT) 22.3-6.8 MG/ML ophthalmic solution 1 drop, Both Eyes, 2 times daily   isosorbide mononitrate (IMDUR) 30 MG 24 hr tablet TAKE 1/2 TABLET(15 MG) BY MOUTH DAILY   ketorolac (ACULAR) 0.5 % ophthalmic solution 1 drop, Left Eye, 2 times daily   methazolamide (NEPTAZANE) 50 mg, Oral, 3 times daily   multivitamin (THERAGRAN) per tablet 1  tablet, Oral, Daily,     nitroGLYCERIN (NITROSTAT) 0.4 mg, Sublingual, Every 5 min x3 PRN   rosuvastatin (CRESTOR) 10 MG tablet TAKE 1 TABLET(10 MG) BY MOUTH DAILY   tamsulosin (FLOMAX) 0.4 mg, Oral, 2 times daily   telmisartan (MICARDIS) 40 mg, Oral, Daily   zolpidem (AMBIEN CR) 6.25 mg, Oral, At bedtime PRN       Objective:   Physical Exam BP 132/72   Pulse (!) 58   Temp 98 F (36.7 C) (Oral)   Resp 16   Ht 5' 9"$  (1.753 m)   Wt 175 lb 4 oz (79.5 kg)   SpO2 97%   BMI 25.88 kg/m  General:   Well developed, NAD, BMI noted. HEENT:  Normocephalic . Face symmetric, atraumatic Lungs:  CTA B Normal respiratory effort, no intercostal retractions, no accessory muscle use. Heart: RRR,  no murmur.  Lower extremities: no pretibial edema bilaterally  Skin: Not pale. Not jaundice Neurologic:  alert & oriented X3.  Speech normal, gait appropriate for age and unassisted Psych--  Cognition and judgment appear intact.  Cooperative with normal attention span and concentration.  Behavior appropriate. No anxious or depressed appearing.      Assessment     Assessment   HTN- on Cardizem for  a while, added losartan 10-2015 Hyperlipidemia ( Lipitor .  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 Rx medical mgmt   PLAN: HTN: Well-controlled on Cardizem, Imdur, Micardis.  No change. High cholesterol, continue Crestor Glaucoma, blindness: Unfortunately this is a  major problem for the patient. Insomnia: See LOV, Ambien was changed from 5 mg to Ambien CR 6.25 mg, overall is better although not 100%.  Recommend no change for now, tips about good sleep provided. Methazolamide: Reportedly not taking, Preventive care:recommend COVID booster and RSV RTC 3-4 m

## 2023-02-06 NOTE — Assessment & Plan Note (Signed)
HTN: Well-controlled on Cardizem, Imdur, Micardis.  No change. High cholesterol, continue Crestor Glaucoma, blindness: Unfortunately this is a major problem for the patient. Insomnia: See LOV, Ambien was changed from 5 mg to Ambien CR 6.25 mg, overall is better although not 100%.  Recommend no change for now, tips about good sleep provided. Methazolamide: Reportedly not taking, Preventive care:recommend COVID booster and RSV RTC 3-4 m

## 2023-02-08 DIAGNOSIS — L814 Other melanin hyperpigmentation: Secondary | ICD-10-CM | POA: Diagnosis not present

## 2023-02-08 DIAGNOSIS — Z961 Presence of intraocular lens: Secondary | ICD-10-CM | POA: Diagnosis not present

## 2023-02-08 DIAGNOSIS — H16143 Punctate keratitis, bilateral: Secondary | ICD-10-CM | POA: Diagnosis not present

## 2023-02-08 DIAGNOSIS — H04123 Dry eye syndrome of bilateral lacrimal glands: Secondary | ICD-10-CM | POA: Diagnosis not present

## 2023-02-08 DIAGNOSIS — H401133 Primary open-angle glaucoma, bilateral, severe stage: Secondary | ICD-10-CM | POA: Diagnosis not present

## 2023-02-08 DIAGNOSIS — H50112 Monocular exotropia, left eye: Secondary | ICD-10-CM | POA: Diagnosis not present

## 2023-02-08 DIAGNOSIS — H02886 Meibomian gland dysfunction of left eye, unspecified eyelid: Secondary | ICD-10-CM | POA: Diagnosis not present

## 2023-02-08 DIAGNOSIS — H5010 Unspecified exotropia: Secondary | ICD-10-CM | POA: Diagnosis not present

## 2023-02-08 DIAGNOSIS — H548 Legal blindness, as defined in USA: Secondary | ICD-10-CM | POA: Diagnosis not present

## 2023-02-08 DIAGNOSIS — R231 Pallor: Secondary | ICD-10-CM | POA: Diagnosis not present

## 2023-02-08 DIAGNOSIS — H02883 Meibomian gland dysfunction of right eye, unspecified eyelid: Secondary | ICD-10-CM | POA: Diagnosis not present

## 2023-02-11 ENCOUNTER — Other Ambulatory Visit: Payer: Self-pay | Admitting: Internal Medicine

## 2023-02-11 MED ORDER — DILTIAZEM HCL ER COATED BEADS 240 MG PO CP24: 240.0000 mg | ORAL_CAPSULE | Freq: Two times a day (BID) | ORAL | 1 refills | Status: DC

## 2023-02-20 ENCOUNTER — Ambulatory Visit: Payer: Medicare PPO | Attending: Physician Assistant | Admitting: Physician Assistant

## 2023-02-20 ENCOUNTER — Encounter: Payer: Self-pay | Admitting: Physician Assistant

## 2023-02-20 VITALS — BP 130/76 | HR 78 | Ht 69.0 in | Wt 177.8 lb

## 2023-02-20 DIAGNOSIS — E785 Hyperlipidemia, unspecified: Secondary | ICD-10-CM | POA: Diagnosis not present

## 2023-02-20 DIAGNOSIS — G4733 Obstructive sleep apnea (adult) (pediatric): Secondary | ICD-10-CM | POA: Diagnosis not present

## 2023-02-20 DIAGNOSIS — I10 Essential (primary) hypertension: Secondary | ICD-10-CM | POA: Diagnosis not present

## 2023-02-20 DIAGNOSIS — I2510 Atherosclerotic heart disease of native coronary artery without angina pectoris: Secondary | ICD-10-CM | POA: Diagnosis not present

## 2023-02-20 DIAGNOSIS — I700 Atherosclerosis of aorta: Secondary | ICD-10-CM | POA: Diagnosis not present

## 2023-02-20 MED ORDER — ISOSORBIDE MONONITRATE ER 30 MG PO TB24: 30.0000 mg | ORAL_TABLET | Freq: Every day | ORAL | 3 refills | Status: DC

## 2023-02-20 NOTE — Assessment & Plan Note (Signed)
LDL optimal on most recent lab work.  Continue current Rx with Crestor 10 mg once daily.

## 2023-02-20 NOTE — Patient Instructions (Signed)
Medication Instructions:  Your physician has recommended you make the following change in your medication:   INCREASE the Imdur to 30 mg taking 1 tablet daily   *If you need a refill on your cardiac medications before your next appointment, please call your pharmacy*   Lab Work: None ordered  If you have labs (blood work) drawn today and your tests are completely normal, you will receive your results only by: [place] (if you have MyChart) OR A paper copy in the mail If you have any lab test that is abnormal or we need to change your treatment, we will call you to review the results.   Testing/Procedures: None ordered   Follow-Up: At [facility], you and your health needs are our priority.  As part of our continuing mission to provide you with exceptional heart care, we have created designated Provider Care Teams.  These Care Teams include your primary Cardiologist (physician) and Advanced Practice Providers (APPs -  Physician Assistants and Nurse Practitioners) who all work together to provide you with the care you need, when you need it.  We recommend signing up for the patient portal called "MyChart".  Sign up information is provided on this After Visit Summary.  MyChart is used to connect with patients for Virtual Visits (Telemedicine).  Patients are able to view lab/test results, encounter notes, upcoming appointments, etc.  Non-urgent messages can be sent to your provider as well.   To learn more about what you can do with MyChart, go to [email or web address].    Your next appointment:   6 month(s)  Provider:   Werner Lean, MD     Other Instructions

## 2023-02-20 NOTE — Assessment & Plan Note (Signed)
He no longer wears a CPAP. He is not interested in a sleep med consultation.

## 2023-02-20 NOTE — Progress Notes (Signed)
Cardiology Office Note:   Date:  02/20/2023  ID:  Darren Barnes, DOB [birth year], MRN [id number]  Patient Profile: Coronary artery disease 3 v CAD by CCTA >> pt opted for med Rx and deferred LHC  CCTA 3/322: CAC score 885 (82%); RCA prox 25-49, mid 50-70, dist 25-49 // LM dist < 10 and 20 // LAD prox 25-49, mid 25-49, dist 25-49; oD1 and mD1 25-49 // LCx prox 50-70, mid 25-49 then 50-70 >> FFR: dLAD 0.75; dLCx 0.73; mRCA 0.82, dRCA 0.63 TTE 03/03/21: EF 60-65, no RWMA, Gr 1 DD, NL RVSF, RAP 3 Hypertension  Hyperlipidemia  OSA  Aortic atherosclerosis  LE Arterial US 11/28/12: Normal  Subjective    History of Present Illness:   Darren Barnes is a 83 y.o. male with the above problem list.  Rabun was last seen in clinic by Leanor Kail, PA-C 07/04/22. He returns for f/u on CAD. He is here with his wife. His vision has gotten worse and he is now legally blind. He is less active than previous. He still walks on his treadmill a couple of times a week. He has not had chest pain, shortness of breath, syncope, edema.   EKG: NSR, HR 61, NL axis, 1st degree AVB, PR 212 ms, no STTW changes, QTc 372 ms      ROS  Objective   Labs/Other Test Reviewed:    Risk Assessment/Calculations/Metrics:             Physical Exam:   VS:  BP 130/76   Pulse 78   Ht '5\' 9"'$  (1.753 m)   Wt 177 lb 12.8 oz (80.6 kg)   SpO2 97%   BMI 26.26 kg/m    Wt Readings from Last 3 Encounters:  02/20/23 177 lb 12.8 oz (80.6 kg)  02/05/23 175 lb 4 oz (79.5 kg)  11/01/22 174 lb 8 oz (79.2 kg)    Constitutional:      Appearance: Healthy appearance. Not in distress.  Neck:     Vascular: No carotid bruit. JVD normal.  Pulmonary:     Breath sounds: Normal breath sounds. No wheezing. No rales.  Cardiovascular:     Normal rate. Irregular rhythm. Normal S1. Normal S2.      Murmurs: There is a grade 1/6 systolic murmur at the URSB.  Edema:    Peripheral edema absent.  Abdominal:     Palpations: Abdomen is soft.       Assessment & Plan    ASSESSMENT & PLAN:   Coronary artery disease involving native coronary artery of native heart without angina pectoris 3v coronary artery disease on CCTA in 02/2021. FFR was + mainly in distal vessels. He has not had angina and preferred med Rx. He continues to do well w/o chest pain to suggest angina. Continue ASA 81 mg once daily, Diltiazem 240 mg once daily, Crestor 10 mg once daily. It is difficult to break the Imdur in 1/2. His BP can certainly tolerate taking a higher dose. Increase Imdur to 30 mg once daily. F/u 6 mos.   Aortic atherosclerosis (HCC) Continue ASA, statin Rx  Hyperlipidemia LDL goal <70 LDL optimal on most recent lab work.  Continue current Rx with Crestor 10 mg once daily.   Essential hypertension The patient's blood pressure is controlled on his current regimen.  Continue current therapy with Cardizem CD 240 mg once daily, Imdur, Micardis 40 mg once daily.   OSA (obstructive sleep apnea) He no longer wears a CPAP. He is not interested in  a sleep med consultation.        Dispo:  Return in about 6 months (around 08/21/2023) for Routine Follow Up w/ Dr. Gasper Sells.   Signed, Richardson Dopp, PA-C

## 2023-02-20 NOTE — Assessment & Plan Note (Signed)
Continue ASA, statin Rx

## 2023-02-20 NOTE — Assessment & Plan Note (Addendum)
3v coronary artery disease on CCTA in 02/2021. FFR was + mainly in distal vessels. He has not had angina and preferred med Rx. He continues to do well w/o chest pain to suggest angina. Continue ASA 81 mg once daily, Diltiazem 240 mg once daily, Crestor 10 mg once daily. It is difficult to break the Imdur in 1/2. His BP can certainly tolerate taking a higher dose. Increase Imdur to 30 mg once daily. F/u 6 mos.

## 2023-02-20 NOTE — Assessment & Plan Note (Signed)
The patient's blood pressure is controlled on his current regimen.  Continue current therapy with Cardizem CD 240 mg once daily, Imdur, Micardis 40 mg once daily.

## 2023-03-30 ENCOUNTER — Other Ambulatory Visit: Payer: Self-pay | Admitting: Internal Medicine

## 2023-04-04 ENCOUNTER — Encounter: Payer: Self-pay | Admitting: Internal Medicine

## 2023-04-16 DIAGNOSIS — H548 Legal blindness, as defined in USA: Secondary | ICD-10-CM | POA: Diagnosis not present

## 2023-04-16 DIAGNOSIS — Z961 Presence of intraocular lens: Secondary | ICD-10-CM | POA: Diagnosis not present

## 2023-04-16 DIAGNOSIS — H401133 Primary open-angle glaucoma, bilateral, severe stage: Secondary | ICD-10-CM | POA: Diagnosis not present

## 2023-04-16 DIAGNOSIS — H50112 Monocular exotropia, left eye: Secondary | ICD-10-CM | POA: Diagnosis not present

## 2023-04-16 DIAGNOSIS — H547 Unspecified visual loss: Secondary | ICD-10-CM | POA: Diagnosis not present

## 2023-04-16 DIAGNOSIS — H04129 Dry eye syndrome of unspecified lacrimal gland: Secondary | ICD-10-CM | POA: Diagnosis not present

## 2023-04-28 ENCOUNTER — Other Ambulatory Visit: Payer: Self-pay | Admitting: Internal Medicine

## 2023-05-30 ENCOUNTER — Telehealth: Payer: Self-pay | Admitting: Internal Medicine

## 2023-05-30 NOTE — Telephone Encounter (Signed)
Patient spouse called and states the medication diltiazem  has been out of stock, is there another medication that can be given instead. Please advise [phone].

## 2023-05-31 ENCOUNTER — Telehealth: Payer: Self-pay | Admitting: Internal Medicine

## 2023-05-31 MED ORDER — DILTIAZEM HCL ER COATED BEADS 240 MG PO CP24: ORAL_CAPSULE | ORAL | 1 refills | Status: DC

## 2023-05-31 NOTE — Telephone Encounter (Signed)
Pt c/o medication issue:  1. Name of Medication:   diltiazem (CARDIZEM CD) 240 MG 24 hr capsule   2. How are you currently taking this medication (dosage and times per day)?  As prescribed  3. Are you having a reaction (difficulty breathing--STAT)?   No  4. What is your medication issue?    Wife states patient has not been able to get this medication and she wants to get alternate medication.

## 2023-05-31 NOTE — Telephone Encounter (Signed)
Called pt spouse who reports current pharmacy does not have diltiazem 240 mg PO BID.  Last fill was 05/09/23 only received 26 pills.   RN called [facility] pharmacy on [address] was on hold for 20 min with no response. Asked pt spouse if she would like me to call another pharmacy to see if med is available replied yes.   RN called multiple pharmacies with no availability.  [facility] [address] reports has enough for 30 day supply.   Sent in script to this pharmacy.  Called spouse and advised her of this information.  Will call pharmacy back later to check if med is available.     Called [facility] spoke with Madelaine Bhat reports have prescription and med is in stock. Called spouse advised of this information. Spouse already at pharmacy picking up medication. No further concerns at this time.

## 2023-05-31 NOTE — Telephone Encounter (Signed)
Please advise 

## 2023-05-31 NOTE — Telephone Encounter (Signed)
LMOM informing Pt that we received message. Looks like cardiology actually handles this medication. Recommended they contact their office for advice.

## 2023-05-31 NOTE — Telephone Encounter (Signed)
Left a message to call back.

## 2023-05-31 NOTE — Telephone Encounter (Signed)
Please send the refill to our pharmacy, perhaps they have it available.

## 2023-06-17 ENCOUNTER — Other Ambulatory Visit: Payer: Self-pay | Admitting: Internal Medicine

## 2023-06-18 ENCOUNTER — Ambulatory Visit: Payer: Medicare PPO | Admitting: Internal Medicine

## 2023-06-18 ENCOUNTER — Telehealth: Payer: Self-pay | Admitting: Internal Medicine

## 2023-06-18 ENCOUNTER — Telehealth: Payer: Self-pay

## 2023-06-18 MED ORDER — ZOLPIDEM TARTRATE ER 6.25 MG PO TBCR: 6.2500 mg | EXTENDED_RELEASE_TABLET | Freq: Every evening | ORAL | 0 refills | Status: DC | PRN

## 2023-06-18 NOTE — Telephone Encounter (Signed)
Requesting: Ambien 6.25mg   Contract: 02/05/23 UDS: Ambien only Last Visit: 02/05/23 Next Visit: 07/02/23 Last Refill: 02/05/23 #30 and 3RF  Please Advise

## 2023-06-18 NOTE — Telephone Encounter (Signed)
Pdmp ok, rx sent  ? ?

## 2023-06-18 NOTE — Addendum Note (Signed)
Addended by: Wanda Plump on: 06/18/2023 03:27 PM   Modules accepted: Orders

## 2023-06-18 NOTE — Telephone Encounter (Signed)
Vikki Ports (spouse DPR Ok) called stating that pt regular pharmacy is out of Ambien and needs to have it rerouted to the following pharmacy:  [facility] [address] [place], [place], [place] [address] P: [phone]

## 2023-06-18 NOTE — Addendum Note (Signed)
Addended byConrad Bridger D on: 06/18/2023 02:46 PM   Modules accepted: Orders

## 2023-06-18 NOTE — Telephone Encounter (Signed)
Will you resend please?

## 2023-06-18 NOTE — Telephone Encounter (Signed)
Sent!

## 2023-06-24 DIAGNOSIS — H401133 Primary open-angle glaucoma, bilateral, severe stage: Secondary | ICD-10-CM | POA: Diagnosis not present

## 2023-07-02 ENCOUNTER — Ambulatory Visit: Payer: Medicare PPO | Admitting: Internal Medicine

## 2023-07-02 ENCOUNTER — Encounter: Payer: Self-pay | Admitting: Internal Medicine

## 2023-07-02 VITALS — BP 126/62 | HR 58 | Temp 98.4°F | Resp 16 | Ht 69.0 in | Wt 174.1 lb

## 2023-07-02 DIAGNOSIS — R399 Unspecified symptoms and signs involving the genitourinary system: Secondary | ICD-10-CM

## 2023-07-02 DIAGNOSIS — G4700 Insomnia, unspecified: Secondary | ICD-10-CM

## 2023-07-02 DIAGNOSIS — D696 Thrombocytopenia, unspecified: Secondary | ICD-10-CM

## 2023-07-02 DIAGNOSIS — I10 Essential (primary) hypertension: Secondary | ICD-10-CM

## 2023-07-02 DIAGNOSIS — E785 Hyperlipidemia, unspecified: Secondary | ICD-10-CM

## 2023-07-02 LAB — CBC WITH DIFFERENTIAL/PLATELET
Basophils Absolute: 0 10*3/uL (ref 0.0–0.1)
Basophils Relative: 0.4 % (ref 0.0–3.0)
Eosinophils Absolute: 0.3 10*3/uL (ref 0.0–0.7)
Eosinophils Relative: 4.6 % (ref 0.0–5.0)
HCT: 42.6 % (ref 39.0–52.0)
Hemoglobin: 13.5 g/dL (ref 13.0–17.0)
Lymphocytes Relative: 19.8 % (ref 12.0–46.0)
Lymphs Abs: 1.2 10*3/uL (ref 0.7–4.0)
MCHC: 31.7 g/dL (ref 30.0–36.0)
MCV: 87.3 fl (ref 78.0–100.0)
Monocytes Absolute: 0.8 10*3/uL (ref 0.1–1.0)
Monocytes Relative: 12.4 % — ABNORMAL HIGH (ref 3.0–12.0)
Neutro Abs: 3.9 10*3/uL (ref 1.4–7.7)
Neutrophils Relative %: 62.8 % (ref 43.0–77.0)
Platelets: 95 10*3/uL — ABNORMAL LOW (ref 150.0–400.0)
RBC: 4.88 Mil/uL (ref 4.22–5.81)
RDW: 13.2 % (ref 11.5–15.5)
WBC: 6.2 10*3/uL (ref 4.0–10.5)

## 2023-07-02 LAB — BASIC METABOLIC PANEL
BUN: 17 mg/dL (ref 6–23)
CO2: 26 mEq/L (ref 19–32)
Calcium: 9.1 mg/dL (ref 8.4–10.5)
Chloride: 108 mEq/L (ref 96–112)
Creatinine, Ser: 1.22 mg/dL (ref 0.40–1.50)
GFR: 54.5 mL/min — ABNORMAL LOW (ref >60.00)
Glucose, Bld: 82 mg/dL (ref 70–99)
Potassium: 4.3 mEq/L (ref 3.5–5.1)
Sodium: 140 mEq/L (ref 135–145)

## 2023-07-02 LAB — LIPID PANEL
Cholesterol: 112 mg/dL (ref 0–200)
HDL: 42.4 mg/dL (ref >39.00)
LDL Cholesterol: 56 mg/dL (ref 0–99)
NonHDL: 69.19
Total CHOL/HDL Ratio: 3
Triglycerides: 65 mg/dL (ref 0.0–149.0)
VLDL: 13 mg/dL (ref 0.0–40.0)

## 2023-07-02 MED ORDER — ZOLPIDEM TARTRATE ER 6.25 MG PO TBCR: 6.2500 mg | EXTENDED_RELEASE_TABLET | Freq: Every evening | ORAL | 4 refills | Status: DC | PRN

## 2023-07-02 NOTE — Progress Notes (Unsigned)
Subjective:    Patient ID: Darren Barnes, male    DOB: 1941-10-25, 82 y.o.   MRN: [id number]  DOS:  07/02/2023 Type of visit - description: Follow-up  Here with his wife. No major concerns. Needs a refill on Ambien  Denies chest pain no difficulty breathing Has some nocturia without gross hematuria.  No lower extremity edema.   Review of Systems See above   Past Medical History:  Diagnosis Date   Cataract    ED (erectile dysfunction)    Glaucoma    both eyes   Hx of adenomatous colonic polyps 07/30/2017   Hyperlipidemia    Hypertension    Left inguinal hernia    Nocturia    saw urology 2012, was eval, rx vesicare (after several other meds failed)   Osteoarthritis    Sleep apnea    no cpap used could not tolerate, mild osa per sleep study   Thrombocytopenia (HCC)    saw hematology, observe    Past Surgical History:  Procedure Laterality Date   anal polyp resection  2008   Dr Lisabeth Devoid   Benign tumor from Left arm  yrs ago   colonscopy  08/01/2020   polyps removed   EYE SURGERY  02/22/2020   right eye sx for glaucoma   INGUINAL HERNIA REPAIR Left 09/01/2020   Procedure: OPEN LEFT INGUINAL HERNIA REPAIR WITH MESH;  Surgeon: Abigail Miyamoto, MD;  Location: [facility];  Service: General;  Laterality: Left;   KNEE ARTHROSCOPY  2004   Sinus surgery for benign tumor  2000   stem cell injections to both knees  2017    Current Outpatient Medications  Medication Instructions   aspirin EC 81 mg, Oral, Daily, Swallow whole.   brimonidine (ALPHAGAN) 0.2 % ophthalmic solution Place 1 drop into both eyes 2 times daily.   diltiazem (CARDIZEM CD) 240 MG 24 hr capsule TAKE 1 CAPSULE(240 MG) BY MOUTH IN THE MORNING AND AT BEDTIME   dorzolamide-timolol (COSOPT) 22.3-6.8 MG/ML ophthalmic solution 1 drop, Both Eyes, 2 times daily   isosorbide mononitrate (IMDUR) 30 mg, Oral, Daily   ketorolac (ACULAR) 0.5 % ophthalmic solution 1 drop, Left Eye, 2 times daily    latanoprost (XALATAN) 0.005 % ophthalmic solution 1 drop, Right Eye, Daily at bedtime   multivitamin (THERAGRAN) per tablet 1 tablet, Oral, Daily,     nitroGLYCERIN (NITROSTAT) 0.4 mg, Sublingual, Every 5 min x3 PRN   rosuvastatin (CRESTOR) 10 MG tablet TAKE 1 TABLET(10 MG) BY MOUTH DAILY   tamsulosin (FLOMAX) 0.4 MG CAPS capsule TAKE 1 CAPSULE(0.4 MG) BY MOUTH IN THE MORNING AND AT BEDTIME   telmisartan (MICARDIS) 40 mg, Oral, Daily   zolpidem (AMBIEN CR) 6.25 mg, Oral, At bedtime PRN       Objective:   Physical Exam BP 126/62   Pulse (!) 58   Temp 98.4 F (36.9 C) (Oral)   Resp 16   Ht 5\' 9"  (1.753 m)   Wt 174 lb 2 oz (79 kg)   SpO2 97%   BMI 25.71 kg/m   General:   Well developed, NAD, BMI noted. HEENT:  Normocephalic . Face symmetric, atraumatic Lungs:  CTA B Normal respiratory effort, no intercostal retractions, no accessory muscle use. Heart: RRR,  no murmur.  Lower extremities: no pretibial edema bilaterally  Skin: Not pale. Not jaundice Neurologic:  alert & oriented X3.  Speech normal, gait appropriate for age, unassisted, shuffles, small steps  psych--  Cognition and judgment appear intact.  Cooperative  with normal attention span and concentration.  Behavior appropriate. No anxious or depressed appearing.       Assessment     Assessment   HTN- on Cardizem for a while, added losartan 10-2015 Hyperlipidemia ( Lipitor .  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 Rx medical mgmt   PLAN: HTN Reports normal ambulatory BPs, continue Cardizem, Imdur, Micardis.  Check a BMP Hyperlipidemia: On Crestor, check FLP. Thrombocytopenia: History of, check CBC. Insomnia: PDMP okay, refill Ambien as requested by the patient. Cardiovascular: Cardiology OV 02/20/2023, felt to be stable, Imdur increased to 30 mg  b/c pt couldn't  break tablet in half.  Denies symptoms. LUTS: Only nocturia, on Flomax.  No change. Preventive care: Vaccine advice provided RTC CPX November 2024.

## 2023-07-02 NOTE — Patient Instructions (Addendum)
Vaccines I recommend: Covid booster RSV vaccine Flu shot this fall  Please bring or send [place] a copy for your Healthcare Power of Attorney for your chart.   Check the  blood pressure regularly BP GOAL is between 110/65 and  135/85. If it is consistently higher or lower, let me know      GO TO THE LAB : Get the blood work     GO TO THE FRONT DESK, PLEASE SCHEDULE YOUR APPOINTMENTS Come back for a physical exam by November 2024

## 2023-07-03 NOTE — Assessment & Plan Note (Signed)
HTN Reports normal ambulatory BPs, continue Cardizem, Imdur, Micardis.  Check a BMP Hyperlipidemia: On Crestor, check FLP. Thrombocytopenia: History of, check CBC. Insomnia: PDMP okay, refill Ambien as requested by the patient. Cardiovascular: Cardiology OV 02/20/2023, felt to be stable, Imdur increased to 30 mg  b/c pt couldn't  break tablet in half.  Denies symptoms. LUTS: Only nocturia, on Flomax.  No change. Preventive care: Vaccine advice provided RTC CPX November 2024.

## 2023-07-19 ENCOUNTER — Other Ambulatory Visit: Payer: Self-pay | Admitting: Internal Medicine

## 2023-07-23 ENCOUNTER — Other Ambulatory Visit: Payer: Self-pay | Admitting: Internal Medicine

## 2023-07-27 ENCOUNTER — Other Ambulatory Visit: Payer: Self-pay | Admitting: Internal Medicine

## 2023-07-29 ENCOUNTER — Other Ambulatory Visit: Payer: Self-pay

## 2023-07-29 MED ORDER — ROSUVASTATIN CALCIUM 10 MG PO TABS: 10.0000 mg | ORAL_TABLET | Freq: Every day | ORAL | 1 refills | Status: DC

## 2023-07-29 MED ORDER — ISOSORBIDE MONONITRATE ER 30 MG PO TB24: 30.0000 mg | ORAL_TABLET | Freq: Every day | ORAL | 1 refills | Status: DC

## 2023-08-13 ENCOUNTER — Other Ambulatory Visit: Payer: Self-pay | Admitting: *Deleted

## 2023-08-13 MED ORDER — DILTIAZEM HCL ER COATED BEADS 240 MG PO CP24: ORAL_CAPSULE | ORAL | 1 refills | Status: DC

## 2023-08-19 ENCOUNTER — Ambulatory Visit: Payer: Medicare PPO | Admitting: Podiatry

## 2023-08-19 ENCOUNTER — Encounter: Payer: Self-pay | Admitting: Podiatry

## 2023-08-19 DIAGNOSIS — B351 Tinea unguium: Secondary | ICD-10-CM | POA: Diagnosis not present

## 2023-08-19 DIAGNOSIS — M79674 Pain in right toe(s): Secondary | ICD-10-CM | POA: Diagnosis not present

## 2023-08-19 DIAGNOSIS — M79675 Pain in left toe(s): Secondary | ICD-10-CM | POA: Diagnosis not present

## 2023-08-19 NOTE — Progress Notes (Signed)
  Subjective:  Patient ID: Darren Barnes, male    DOB: 1941/05/27,  MRN: [id number]  Chief Complaint  Patient presents with   Nail Problem    "I have toenail fungus."    82 y.o. male presents with the above complaint. History confirmed with patient.  Nails are thickened elongated causing pain and discomfort in shoe gear has been unable to cut them, he has glaucoma and cannot see well anymore.  Previously used a topical treatment here and feels like they got worse after using it  Objective:  Physical Exam: warm, good capillary refill, no trophic changes or ulcerative lesions, normal DP and PT pulses, and normal sensory exam. Left Foot: dystrophic yellowed discolored nail plates with subungual debris Right Foot: dystrophic yellowed discolored nail plates with subungual debris  Assessment:   1. Pain due to onychomycosis of toenails of both feet      Plan:  Patient was evaluated and treated and all questions answered.  Discussed the etiology and treatment options for the condition in detail with the patient. Educated patient on the topical and oral treatment options for mycotic nails.  At this point with the severe dystrophy and mycosis I do not think that any topical treatment will be successful, I would recommend debridement of the nails today. Sharp and mechanical debridement performed of all painful and mycotic nails today. Nails debrided in length and thickness using a nail nipper to level of comfort. Discussed treatment options including appropriate shoe gear.  He will follow-up with [place] on a regular schedule for this to prevent the nails from becoming too thick or painful    Return in about 3 months (around 11/19/2023) for painful thick fungal nails.

## 2023-08-27 ENCOUNTER — Other Ambulatory Visit: Payer: Self-pay | Admitting: Internal Medicine

## 2023-09-11 ENCOUNTER — Encounter: Payer: Self-pay | Admitting: Internal Medicine

## 2023-09-11 ENCOUNTER — Telehealth: Payer: Self-pay | Admitting: Internal Medicine

## 2023-09-11 ENCOUNTER — Ambulatory Visit: Payer: Medicare PPO | Attending: Internal Medicine | Admitting: Internal Medicine

## 2023-09-11 VITALS — BP 132/76 | HR 61 | Ht 69.0 in | Wt 173.0 lb

## 2023-09-11 DIAGNOSIS — N6452 Nipple discharge: Secondary | ICD-10-CM | POA: Diagnosis not present

## 2023-09-11 DIAGNOSIS — E785 Hyperlipidemia, unspecified: Secondary | ICD-10-CM | POA: Diagnosis not present

## 2023-09-11 DIAGNOSIS — I2510 Atherosclerotic heart disease of native coronary artery without angina pectoris: Secondary | ICD-10-CM | POA: Diagnosis not present

## 2023-09-11 DIAGNOSIS — I10 Essential (primary) hypertension: Secondary | ICD-10-CM | POA: Diagnosis not present

## 2023-09-11 DIAGNOSIS — I700 Atherosclerosis of aorta: Secondary | ICD-10-CM

## 2023-09-11 NOTE — Telephone Encounter (Signed)
Spoke w/ Vikki Ports- Pt's wife, appt scheduled w/ Lillia Abed on Friday.

## 2023-09-11 NOTE — Progress Notes (Signed)
Cardiology Office Note:  .    Date:  09/11/2023  ID:  [name], DOB Aug 29, 1941, MRN [id number] PCP: Wanda Plump, MD  [facility] Providers Cardiologist:  Christell Constant, MD     CC: Follow up CAD  History of Present Illness: .    Niccolo Brouse is a 82 y.o. male with a history of HTN, HLD, and OSA.   2022: Found to have distal obstructive coronary disease.  Treated with medical management 2023. Lost most of his sight. 2024: Increased Imdur for convenience when he saw Scott  Mr. Manella, an 82 year old male with a history of hypertension, hyperlipidemia, and obstructive coronary artery disease, presents with a new symptom of nipple discharge and itching in his left breast. The patient has been managing his coronary artery disease medically since 2022, with significant functional stenosis in the distal left circ and the distal RCA. He has been feeling well on goal-directed medical therapy, with well-controlled blood pressure and optimized cholesterol levels. His current medications include Imdur 30 mg, diltiazem 240 mg, 2 mg PO daily, PMN nitroglycerin, rosuvastatin 10 mg daily, and telmisartan 40 mg. His LDL is 56, and his creatinine is mildly elevated at 1.22.   Relevant histories: .  Social, comes with wife; he is now legally blind ROS: As per HPI.   Studies Reviewed: .   Cardiac Studies & Procedures       ECHOCARDIOGRAM  ECHOCARDIOGRAM COMPLETE 03/03/2021  Narrative ECHOCARDIOGRAM REPORT    Patient Name:   BAILEN MCCONVILLE Date of Exam: 03/03/2021 Medical Rec #:  [id number]    Height:       69.0 in Accession #:    [id number]   Weight:       198.0 lb Date of Birth:  [birth year]     BSA:          2.057 m Patient Age:    80 years     BP:           130/60 mmHg Patient Gender: M            HR:           50 bpm. Exam Location:  [place]  Procedure: 2D Echo, Cardiac Doppler and Color Doppler  Indications:    R01.1 Murmur  History:        Patient has no prior  history of Echocardiogram examinations. Signs/Symptoms:Murmur and Chest Pain; Risk Factors:Hypertension, Dyslipidemia, Sleep Apnea and Former Smoker.  Sonographer:    Joaquim Nam Referring Phys: [id number] Tasean Mancha A Jodeen Mclin  IMPRESSIONS   1. Left ventricular ejection fraction, by estimation, is 60 to 65%. The left ventricle has normal function. The left ventricle has no regional wall motion abnormalities. Left ventricular diastolic parameters are consistent with Grade I diastolic dysfunction (impaired relaxation). 2. Right ventricular systolic function is normal. The right ventricular size is normal. 3. The mitral valve is normal in structure. No evidence of mitral valve regurgitation. No evidence of mitral stenosis. 4. The aortic valve is normal in structure. Aortic valve regurgitation is not visualized. No aortic stenosis is present. 5. The inferior vena cava is normal in size with greater than 50% respiratory variability, suggesting right atrial pressure of 3 mmHg.  FINDINGS Left Ventricle: Left ventricular ejection fraction, by estimation, is 60 to 65%. The left ventricle has normal function. The left ventricle has no regional wall motion abnormalities. The left ventricular internal cavity size was normal in size. There is no left ventricular hypertrophy. Left ventricular diastolic parameters  are consistent with Grade I diastolic dysfunction (impaired relaxation).  Right Ventricle: The right ventricular size is normal. No increase in right ventricular wall thickness. Right ventricular systolic function is normal.  Left Atrium: Left atrial size was normal in size.  Right Atrium: Right atrial size was normal in size.  Pericardium: There is no evidence of pericardial effusion.  Mitral Valve: The mitral valve is normal in structure. No evidence of mitral valve regurgitation. No evidence of mitral valve stenosis.  Tricuspid Valve: The tricuspid valve is normal in structure. Tricuspid  valve regurgitation is not demonstrated. No evidence of tricuspid stenosis.  Aortic Valve: The aortic valve is normal in structure. Aortic valve regurgitation is not visualized. No aortic stenosis is present.  Pulmonic Valve: The pulmonic valve was normal in structure. Pulmonic valve regurgitation is not visualized. No evidence of pulmonic stenosis.  Aorta: The aortic root is normal in size and structure.  Venous: The inferior vena cava is normal in size with greater than 50% respiratory variability, suggesting right atrial pressure of 3 mmHg.  IAS/Shunts: No atrial level shunt detected by color flow Doppler.   LEFT VENTRICLE PLAX 2D LVIDd:         4.46 cm  Diastology LVIDs:         3.00 cm  LV e' medial:    5.44 cm/s LV PW:         1.00 cm  LV E/e' medial:  13.1 LV IVS:        1.03 cm  LV e' lateral:   8.38 cm/s LVOT diam:     2.20 cm  LV E/e' lateral: 8.5 LV SV:         95 LV SV Index:   46 LVOT Area:     3.80 cm   RIGHT VENTRICLE RV S prime:     13.70 cm/s TAPSE (M-mode): 2.1 cm  LEFT ATRIUM             Index       RIGHT ATRIUM           Index LA diam:        3.10 cm 1.51 cm/m  RA Area:     12.80 cm LA Vol (A2C):   35.2 ml 17.11 ml/m RA Volume:   27.50 ml  13.37 ml/m LA Vol (A4C):   31.6 ml 15.36 ml/m LA Biplane Vol: 33.2 ml 16.14 ml/m AORTIC VALVE LVOT Vmax:   99.90 cm/s LVOT Vmean:  64.600 cm/s LVOT VTI:    0.249 m  AORTA Ao Root diam: 3.20 cm Ao Asc diam:  3.10 cm  MITRAL VALVE MV Area (PHT): 3.34 cm    SHUNTS MV Decel Time: 227 msec    Systemic VTI:  0.25 m MV E velocity: 71.10 cm/s  Systemic Diam: 2.20 cm MV A velocity: 97.30 cm/s MV E/A ratio:  0.73  Gypsy Balsam MD Electronically signed by Gypsy Balsam MD Signature Date/Time: 03/03/2021/12:26:05 PM    Final     CT SCANS  CT CORONARY MORPH W/CTA COR W/SCORE 02/23/2021  Addendum 02/23/2021  1:15 PM ADDENDUM REPORT: 02/23/2021 13:13  CLINICAL DATA:  82 Year-old African American  Male  EXAM: Cardiac/Coronary  CTA  TECHNIQUE: The patient was scanned on a [employer].  FINDINGS: A 100 kV prospective scan was triggered in the descending thoracic aorta at 111 HU's. Axial non-contrast 3 mm slices were carried out through the heart. The data set was analyzed on a dedicated work station and scored using  the Agatson method. Gantry rotation speed was 250 msecs and collimation was .6 mm. No beta blockade and 0.8 mg of sl NTG was given. The 3D data set was reconstructed in 5% intervals of the 67-82 % of the R-R cycle. Diastolic phases were analyzed on a dedicated work station using MPR, MIP and VRT modes. The patient received 80 cc of contrast.  Aorta:  Normal size.  Aortic atherosclerosis noted.  No dissection.  Aortic Valve:  Tri-leaflet.  No calcifications.  Coronary Arteries:  Normal coronary origin.  Right dominance.  Coronary calcium score of 885. This was 82nd percentile for age, sex, and race matched control.  RCA is a large dominant artery that gives rise to PDA and PLA. There is a mild non-obstructive (25-49%) calcified plaque in the proximal vessel. There is a moderate stenosis (50-70%) soft plaque in the mid vessel. There are tandem mild non-obstructive (25-49%) calcified plaques in the distal vessel.  Left main is a large artery that gives rise to LAD and LCX arteries. There are tandem calcified plaques (< 10% and 20%) in the distal left main.  LAD is a large vessel that gives rise to one large high D1 Branch and one small D2 vessel. There is a mild non-obstructive (25-49%) calcified plaque in the proximal vessel. There is a mild non-obstructive (25-49%) calcified plaque in the mid vessel with a napkin ring's sign (high risk feature).There is a mild non-obstructive (25-49%) calcified plaque in the distal vessel. There are mild non-obstructive (25-49%) calcified plaques in both the ostium of the D1 vessel and the mid D1 vessel. The  Glagov phenomenon is noted.  LCX is a non-dominant artery that bifurcates to one OM1 branch. There is a moderate stenosis (50-70%) mixed plaque in the proximal vessel. There is a mild non-obstructive (25-49%) calcified plaque in the mid vessel with a napkin ring's sign (high risk feature), followed by moderate stenosis (50-70%) mixed plaque (lesion length 19 mm).  Other findings:  Normal pulmonary vein drainage into the left atrium.  Normal left atrial appendage without a thrombus.  Normal size of the pulmonary artery.  Extra-cardiac findings: See attached radiology report for non-cardiac structures.  IMPRESSION: 1. Coronary calcium score of 885. This was 82nd percentile for age, sex, and race matched control.  2. Normal coronary origin.  Right dominance.  3. CAD-RADS 3. Moderate stenosis. Consider symptom-guided anti-ischemic pharmacotherapy as well as risk factor modification per guideline directed care. Additional analysis with CT FFR will be submitted.  4. Aortic atherosclerosis noted.   Electronically Signed By: Riley Lam MD On: 02/23/2021 13:13  Narrative EXAM: OVER-READ INTERPRETATION  CT CHEST  The following report is an over-read performed by radiologist Dr. Charlett Nose of [facility] on 02/23/2021. This over-read does not include interpretation of cardiac or coronary anatomy or pathology. The coronary CTA interpretation by the cardiologist is attached.  COMPARISON:  None.  FINDINGS: Vascular: Heart is normal size. Aorta normal caliber measuring up to 32 mm in the ascending thoracic aorta. Scattered calcifications in the descending thoracic aorta.  Mediastinum/Nodes: No adenopathy  Lungs/Pleura: Dependent atelectasis in the lower lobes. No confluent opacities or effusions.  Upper Abdomen: Calcifications throughout the spleen compatible with old granulomatous disease. No acute findings in the upper  abdomen.  Musculoskeletal: Chest wall soft tissues are unremarkable. No acute bony abnormality.  IMPRESSION: Minimal dependent atelectasis crash that dependent atelectasis in the lower lobes.  Descending aortic atherosclerosis.  Old granulomatous disease in the spleen.  No acute  findings.  Electronically Signed: By: Charlett Nose M.D. On: 02/23/2021 09:57           Physical Exam:    VS:  BP 132/76   Pulse 61   Ht 5\' 9"  (1.753 m)   Wt 173 lb (78.5 kg)   SpO2 98%   BMI 25.55 kg/m    Wt Readings from Last 3 Encounters:  09/11/23 173 lb (78.5 kg)  07/02/23 174 lb 2 oz (79 kg)  02/20/23 177 lb 12.8 oz (80.6 kg)    Gen: no distress   Neck: No JVD Cardiac: No Rubs or Gallops, no murmur, RRR +2 radial pulses Respiratory: Clear to auscultation bilaterally, normal effort, normal  respiratory rate GI: Soft, nontender, non-distended  MS: No  edema;  moves all extremities Integument: Skin feels warm; no breast mass; he has some slight nipple light discoloration with  Neuro:  At time of evaluation, alert and oriented to person/place/time/situation, legally blind Psych: Normal affect, patient feels well   ASSESSMENT AND PLAN: .    Coronary Artery Disease - distal obstructive disease - Stable on medical management with no reported chest pain. Medications include Imdur 30mg , Diltiazem 240mg , Rosuvastatin 10mg , and Telmisartan 40mg . LDL is well controlled at 56. -Continue current medications. -Plan for annual follow-up. - he would be low risk of a biopsy if needed  New Onset Nipple Discharge and Itching - New onset of left nipple discharge and itching with color changes. No family history of breast cancer. No palpable mass or abscess noted on examination. - I have called Dr. Drue Novel and let him know about this finding; no redness or burning as seen in classic Paget's disease of the breast -Uploaded images of the affected area to the medical record for reference.  Hypertension  and Hyperlipidemia - Well controlled on current medications. -Continue current medications.  Obstructive Sleep Apnea No longer on CPAP. -Not amenable to CPAP  One year with me   Riley Lam, MD FASE [facility] Cardiologist [facility]  [address] [place], [address] [place], [place] [address] [phone]  3:38 PM

## 2023-09-11 NOTE — Telephone Encounter (Signed)
Received a phone call from from cardiology, has an abnormal breast exam. Please arrange an appointment within the next few days. Okay to schedule with another provider in the office since I do not see any near future availability

## 2023-09-11 NOTE — Patient Instructions (Signed)
Medication Instructions:  Your physician recommends that you continue on your current medications as directed. Please refer to the Current Medication list given to you today.  *If you need a refill on your cardiac medications before your next appointment, please call your pharmacy*   Lab Work: NONE If you have labs (blood work) drawn today and your tests are completely normal, you will receive your results only by: [place] (if you have MyChart) OR A paper copy in the mail If you have any lab test that is abnormal or we need to change your treatment, we will call you to review the results.   Testing/Procedures: NONE   Follow-Up: At [facility], you and your health needs are our priority.  As part of our continuing mission to provide you with exceptional heart care, we have created designated Provider Care Teams.  These Care Teams include your primary Cardiologist (physician) and Advanced Practice Providers (APPs -  Physician Assistants and Nurse Practitioners) who all work together to provide you with the care you need, when you need it.  We recommend signing up for the patient portal called "MyChart".  Sign up information is provided on this After Visit Summary.  MyChart is used to connect with patients for Virtual Visits (Telemedicine).  Patients are able to view lab/test results, encounter notes, upcoming appointments, etc.  Non-urgent messages can be sent to your provider as well.   To learn more about what you can do with MyChart, go to [email or web address].    Your next appointment:   1 year(s)  Provider:   Rudean Haskell, MD

## 2023-09-12 NOTE — Progress Notes (Signed)
Established patient visit   Patient: Darren Barnes   DOB: 15-Apr-1941   82 y.o. Male  MRN: [id number] Visit Date: 09/13/2023  Today's healthcare provider: Alfredia Ferguson, PA-C   Cc. Left nipple discharge  Subjective    HPI   Pt reports left nipple discharge for a few weeks, with some itching. Denies itching when there is no discharge. Denies feeling any masses. Pt is blind, unable to tell what color the discharge was.  Medications: Outpatient Medications Prior to Visit  Medication Sig   aspirin EC 81 MG tablet Take 81 mg by mouth daily. Swallow whole.   brimonidine (ALPHAGAN) 0.2 % ophthalmic solution Place 1 drop into both eyes 2 times daily.   diltiazem (CARDIZEM CD) 240 MG 24 hr capsule TAKE 1 CAPSULE(240 MG) BY MOUTH IN THE MORNING AND AT BEDTIME   dorzolamide-timolol (COSOPT) 22.3-6.8 MG/ML ophthalmic solution Place 1 drop into both eyes 2 (two) times daily.   isosorbide mononitrate (IMDUR) 30 MG 24 hr tablet Take 1 tablet (30 mg total) by mouth daily.   ketorolac (ACULAR) 0.5 % ophthalmic solution Place 1 drop into the left eye in the morning and at bedtime.   latanoprost (XALATAN) 0.005 % ophthalmic solution Place 1 drop into the right eye at bedtime.   multivitamin (THERAGRAN) per tablet Take 1 tablet by mouth daily.   nitroGLYCERIN (NITROSTAT) 0.4 MG SL tablet Place 1 tablet (0.4 mg total) under the tongue every 5 (five) minutes x 3 doses as needed for chest pain.   rosuvastatin (CRESTOR) 10 MG tablet Take 1 tablet (10 mg total) by mouth daily.   tamsulosin (FLOMAX) 0.4 MG CAPS capsule TAKE 1 CAPSULE(0.4 MG) BY MOUTH IN THE MORNING AND AT BEDTIME   telmisartan (MICARDIS) 40 MG tablet Take 1 tablet (40 mg total) by mouth daily.   zolpidem (AMBIEN CR) 6.25 MG CR tablet Take 1 tablet (6.25 mg total) by mouth at bedtime as needed for sleep.   No facility-administered medications prior to visit.    Review of Systems  Constitutional:  Negative for fatigue and fever.   Respiratory:  Negative for cough and shortness of breath.   Cardiovascular:  Negative for chest pain, palpitations and leg swelling.  Neurological:  Negative for dizziness and headaches.       Objective    There were no vitals taken for this visit.  Physical Exam Vitals reviewed.  Constitutional:      Appearance: He is not ill-appearing.  HENT:     Head: Normocephalic.  Eyes:     Conjunctiva/sclera: Conjunctivae normal.  Cardiovascular:     Rate and Rhythm: Normal rate.  Pulmonary:     Effort: Pulmonary effort is normal. No respiratory distress.  Chest:     Comments: Mild breast tissue enlargement, symmetric.  No masses palpated b/l breasts No nipple discharge elicited on exam  No erythema or rashes Neurological:     General: No focal deficit present.     Mental Status: He is alert and oriented to person, place, and time.  Psychiatric:        Mood and Affect: Mood normal.        Behavior: Behavior normal.      No results found for any visits on 09/13/23.  Assessment & Plan     1. Discharge from left nipple R/o hormonal etiology No masses appreciated  Mild gynecomastia, symmetric  If labs normal would not investigate further  - Prolactin - Testosterone - Estradiol - B-HCG Quant -  LH  Return if symptoms worsen or fail to improve.      I, Alfredia Ferguson, PA-C have reviewed all documentation for this visit. The documentation on  09/13/23   for the exam, diagnosis, procedures, and orders are all accurate and complete.    Alfredia Ferguson, PA-C  [facility] [phone] (phone) [phone] (fax)  [facility]

## 2023-09-13 ENCOUNTER — Ambulatory Visit: Payer: Medicare PPO | Admitting: Physician Assistant

## 2023-09-13 ENCOUNTER — Encounter: Payer: Self-pay | Admitting: Physician Assistant

## 2023-09-13 DIAGNOSIS — N6452 Nipple discharge: Secondary | ICD-10-CM

## 2023-09-13 LAB — LUTEINIZING HORMONE: LH: 5.19 m[IU]/mL (ref 3.10–34.60)

## 2023-09-13 LAB — HCG, QUANTITATIVE, PREGNANCY: Quantitative HCG: <0.6 m[IU]/mL

## 2023-09-13 LAB — TESTOSTERONE: Testosterone: 328.15 ng/dL (ref 300.00–890.00)

## 2023-09-14 LAB — ESTRADIOL: Estradiol: 38 pg/mL (ref <=39)

## 2023-09-14 LAB — PROLACTIN: Prolactin: 15 ng/mL (ref 2.0–18.0)

## 2023-09-19 ENCOUNTER — Other Ambulatory Visit: Payer: Self-pay | Admitting: Internal Medicine

## 2023-09-22 ENCOUNTER — Other Ambulatory Visit: Payer: Self-pay | Admitting: Family

## 2023-09-24 ENCOUNTER — Other Ambulatory Visit: Payer: Self-pay

## 2023-09-24 MED ORDER — TAMSULOSIN HCL 0.4 MG PO CAPS: 0.4000 mg | ORAL_CAPSULE | Freq: Two times a day (BID) | ORAL | 1 refills | Status: DC

## 2023-09-30 DIAGNOSIS — H401133 Primary open-angle glaucoma, bilateral, severe stage: Secondary | ICD-10-CM | POA: Diagnosis not present

## 2023-10-10 ENCOUNTER — Ambulatory Visit: Payer: Medicare PPO | Admitting: *Deleted

## 2023-10-10 DIAGNOSIS — Z0000 Encounter for general adult medical examination without abnormal findings: Secondary | ICD-10-CM | POA: Diagnosis not present

## 2023-10-10 NOTE — Progress Notes (Signed)
Subjective:   Darren Barnes is a 82 y.o. male who presents for Medicare Annual/Subsequent preventive examination.  Visit Complete: Virtual I connected with  Darren Barnes on 10/10/23 by a audio enabled telemedicine application and verified that I am speaking with the correct person using two identifiers.  Patient Location: Home  Provider Location: Office/Clinic  I discussed the limitations of evaluation and management by telemedicine. The patient expressed understanding and agreed to proceed.  Vital Signs: Because this visit was a virtual/telehealth visit, some criteria may be missing or patient reported. Any vitals not documented were not able to be obtained and vitals that have been documented are patient reported.   Cardiac Risk Factors include: advanced age (>3men, >87 women);hypertension;male gender;dyslipidemia     Objective:    There were no vitals filed for this visit. There is no height or weight on file to calculate BMI.     10/10/2023   10:29 AM 09/19/2022    8:25 AM 09/16/2021    8:05 AM 09/01/2020    7:38 AM 05/23/2020   11:34 AM 02/02/2019    3:33 PM 07/11/2017    9:09 AM  Advanced Directives  Does Patient Have a Medical Advance Directive? Yes Yes Yes Yes No No Yes  Type of Estate agent of [place];Living will;Out of facility DNR (pink MOST or yellow form) Healthcare Power of [place];Living will Healthcare Power of [place];Living will    Living will  Does patient want to make changes to medical advance directive? No - Patient declined No - Patient declined  No - Patient declined     Copy of Healthcare Power of Attorney in Chart? No - copy requested No - copy requested No - copy requested      Would patient like information on creating a medical advance directive?     No - Patient declined      Current Medications (verified) Outpatient Encounter Medications as of 10/10/2023  Medication Sig   aspirin EC 81 MG tablet Take 81 mg by mouth daily.  Swallow whole.   brimonidine (ALPHAGAN) 0.2 % ophthalmic solution Place 1 drop into both eyes 2 times daily.   diltiazem (CARDIZEM CD) 240 MG 24 hr capsule TAKE 1 CAPSULE(240 MG) BY MOUTH IN THE MORNING AND AT BEDTIME   dorzolamide-timolol (COSOPT) 22.3-6.8 MG/ML ophthalmic solution Place 1 drop into both eyes 2 (two) times daily.   isosorbide mononitrate (IMDUR) 30 MG 24 hr tablet Take 1 tablet (30 mg total) by mouth daily.   ketorolac (ACULAR) 0.5 % ophthalmic solution Place 1 drop into the left eye in the morning and at bedtime.   latanoprost (XALATAN) 0.005 % ophthalmic solution Place 1 drop into the right eye at bedtime.   multivitamin (THERAGRAN) per tablet Take 1 tablet by mouth daily.   nitroGLYCERIN (NITROSTAT) 0.4 MG SL tablet Place 1 tablet (0.4 mg total) under the tongue every 5 (five) minutes x 3 doses as needed for chest pain.   rosuvastatin (CRESTOR) 10 MG tablet Take 1 tablet (10 mg total) by mouth daily.   tamsulosin (FLOMAX) 0.4 MG CAPS capsule Take 1 capsule (0.4 mg total) by mouth in the morning and at bedtime.   telmisartan (MICARDIS) 40 MG tablet Take 1 tablet (40 mg total) by mouth daily.   zolpidem (AMBIEN CR) 6.25 MG CR tablet TAKE 1 TABLET(6.25 MG) BY MOUTH AT BEDTIME AS NEEDED FOR SLEEP   No facility-administered encounter medications on file as of 10/10/2023.    Allergies (verified) Patient has no  known allergies.   History: Past Medical History:  Diagnosis Date   Cataract    ED (erectile dysfunction)    Glaucoma    both eyes   Hx of adenomatous colonic polyps 07/30/2017   Hyperlipidemia    Hypertension    Left inguinal hernia    Nocturia    saw urology 2012, was eval, rx vesicare (after several other meds failed)   Osteoarthritis    Sleep apnea    no cpap used could not tolerate, mild osa per sleep study   Thrombocytopenia (HCC)    saw hematology, observe   Past Surgical History:  Procedure Laterality Date   anal polyp resection  2008   Dr Lisabeth Devoid    Benign tumor from Left arm  yrs ago   colonscopy  08/01/2020   polyps removed   EYE SURGERY  02/22/2020   right eye sx for glaucoma   INGUINAL HERNIA REPAIR Left 09/01/2020   Procedure: OPEN LEFT INGUINAL HERNIA REPAIR WITH MESH;  Surgeon: Abigail Miyamoto, MD;  Location: [facility];  Service: General;  Laterality: Left;   KNEE ARTHROSCOPY  2004   Sinus surgery for benign tumor  2000   stem cell injections to both knees  2017   Family History  Problem Relation Age of Onset   Coronary artery disease Father 34       MI at 87   Hypertension Mother        M and F    Diabetes Mother    Stroke Mother 32   Colon cancer Neg Hx    Prostate cancer Neg Hx    Esophageal cancer Neg Hx    Pancreatic cancer Neg Hx    Rectal cancer Neg Hx    Stomach cancer Neg Hx    Social History   Socioeconomic History   Marital status: Married    Spouse name: Not on file   Number of children: 3   Years of education: Not on file   Highest education level: Not on file  Occupational History   Occupation: Retired VP of [employer]    Comment: retired  Tobacco Use   Smoking status: Former    Current packs/day: 0.00    Average packs/day: 0.5 packs/day for 5.0 years (2.5 ttl pk-yrs)    Types: Cigarettes    Start date: 12/24/1953    Quit date: 12/24/1958    Years since quitting: 64.8   Smokeless tobacco: Never  Vaping Use   Vaping status: Never Used  Substance and Sexual Activity   Alcohol use: Yes    Comment: socially    Drug use: No   Sexual activity: Not on file  Other Topics Concern   Not on file  Social History Narrative   Lives w/ wife   Lost one son, 2 living children, 8 g-kids   Wife drives             Social Determinants of Health   Financial Resource Strain: Low Risk  (10/10/2023)   Overall Financial Resource Strain (CARDIA)    Difficulty of Paying Living Expenses: Not hard at all  Food Insecurity: No Food Insecurity (10/10/2023)   Hunger Vital Sign    Worried About  Running Out of Food in the Last Year: Never true    Ran Out of Food in the Last Year: Never true  Transportation Needs: No Transportation Needs (10/10/2023)   PRAPARE - Administrator, Civil Service (Medical): No    Lack of Transportation (  Non-Medical): No  Physical Activity: Insufficiently Active (10/10/2023)   Exercise Vital Sign    Days of Exercise per Week: 5 days    Minutes of Exercise per Session: 20 min  Stress: No Stress Concern Present (10/10/2023)   [employer] of Occupational Health - Occupational Stress Questionnaire    Feeling of Stress : Not at all  Social Connections: Moderately Integrated (10/10/2023)   Social Connection and Isolation Panel [NHANES]    Frequency of Communication with Friends and Family: Twice a week    Frequency of Social Gatherings with Friends and Family: Twice a week    Attends Religious Services: Never    Database administrator or Organizations: Yes    Attends Banker Meetings: Never    Marital Status: Married    Tobacco Counseling Counseling given: Not Answered   Clinical Intake:  Pre-visit preparation completed: Yes  Pain : No/denies pain     Nutritional Risks: None Diabetes: No  How often do you need to have someone help you when you read instructions, pamphlets, or other written materials from your doctor or pharmacy?: 5 - Always  Interpreter Needed?: No  Information entered by :: [employer], CMA   Activities of Daily Living    10/10/2023   10:26 AM  In your present state of health, do you have any difficulty performing the following activities:  Hearing? 0  Vision? 1  Difficulty concentrating or making decisions? 0  Walking or climbing stairs? 0  Dressing or bathing? 0  Doing errands, shopping? 1  Comment wife Insurance claims handler and eating ? Y  Comment wife assists with preparing meals  Using the Toilet? N  In the past six months, have you accidently leaked urine? N  Do you have  problems with loss of bowel control? N  Managing your Medications? N  Managing your Finances? Y  Comment wife assists  Housekeeping or managing your Housekeeping? Y  Comment wife assists    Patient Care Team: Wanda Plump, MD as PCP - General (Internal Medicine) Christell Constant, MD as PCP - Cardiology (Cardiology) Oretha Milch, MD as Attending Physician (Pulmonary Disease) Vedia Coffer Audrie Lia as Physician Assistant (Dermatology) Hulen Shouts, MD as Referring Physician (Ophthalmology)  Indicate any recent Medical Services you may have received from other than [facility] providers in the past year (date may be approximate).     Assessment:   This is a routine wellness examination for [place].  Hearing/Vision screen No results found.   Goals Addressed   None    Depression Screen    10/10/2023   10:34 AM 07/02/2023    9:06 AM 02/05/2023   10:34 AM 11/01/2022   10:38 AM 09/19/2022    8:29 AM 07/11/2022   10:32 AM 01/02/2022    8:29 AM  PHQ 2/9 Scores  PHQ - 2 Score 0 2 0 0 0 5 0  PHQ- 9 Score  6 0 0  20     Fall Risk    10/10/2023   10:29 AM 07/02/2023    8:31 AM 02/05/2023   10:34 AM 11/01/2022   10:37 AM 09/19/2022    8:28 AM  Fall Risk   Falls in the past year? 0 0 0 0 0  Number falls in past yr: 0 0 0 0 0  Injury with Fall? 0 0 0 0 0  Risk for fall due to :     No Fall Risks  Follow up Falls evaluation  completed Falls evaluation completed Falls evaluation completed Falls evaluation completed Falls evaluation completed    MEDICARE RISK AT HOME: Medicare Risk at Home Any stairs in or around the home?: Yes If so, are there any without handrails?: No Home free of loose throw rugs in walkways, pet beds, electrical cords, etc?: Yes Adequate lighting in your home to reduce risk of falls?: Yes Life alert?: No Use of a cane, walker or w/c?: No Grab bars in the bathroom?: Yes Shower chair or bench in shower?: No Elevated toilet seat or a handicapped toilet?:  No  TIMED UP AND GO:  Was the test performed?  No    Cognitive Function:        10/10/2023   10:36 AM 09/19/2022    8:34 AM 09/16/2021    8:09 AM  6CIT Screen  What Year? 0 points 0 points 0 points  What month? 0 points 0 points 0 points  What time? 0 points 0 points 0 points  Count back from 20 0 points 4 points 0 points  Months in reverse 0 points 2 points 0 points  Repeat phrase 4 points 0 points 6 points  Total Score 4 points 6 points 6 points    Immunizations Immunization History  Administered Date(s) Administered   Fluad Quad(high Dose 65+) 09/18/2022   Influenza Split 09/23/2021   Influenza Whole 09/23/2012   Influenza, High Dose Seasonal PF 08/24/2013, 09/19/2019   Influenza-Unspecified 10/14/2015, 09/17/2016, 09/20/2017   Moderna Covid-19 Vaccine Bivalent Booster 62yrs & up 12/26/2021   Moderna Sars-Covid-2 Vaccination 01/04/2020, 02/01/2020, 10/31/2020, 02/22/2021   Pneumococcal Conjugate-13 05/20/2014, 09/11/2017   Pneumococcal Polysaccharide-23 04/17/2004, 05/03/2009, 06/28/2021   Td 04/17/2004   Tetanus 05/20/2014   Zoster Recombinant(Shingrix) 07/03/2019, 09/03/2019   Zoster, Live 05/10/2009    TDAP status: Up to date  Flu Vaccine status: Declined, Education has been provided regarding the importance of this vaccine but patient still declined. Advised may receive this vaccine at local pharmacy or Health Dept. Aware to provide a copy of the vaccination record if obtained from local pharmacy or Health Dept. Verbalized acceptance and understanding.  Pneumococcal vaccine status: Up to date  Covid-19 vaccine status: Information provided on how to obtain vaccines.   Qualifies for Shingles Vaccine? Yes   Zostavax completed Yes   Shingrix Completed?: Yes  Screening Tests Health Maintenance  Topic Date Due   COVID-19 Vaccine (6 - 2023-24 season) 08/25/2023   Medicare Annual Wellness (AWV)  09/20/2023   INFLUENZA VACCINE  03/23/2024 (Originally 07/25/2023)    DTaP/Tdap/Td (3 - Tdap) 05/20/2024   Pneumonia Vaccine 68+ Years old  Completed   Zoster Vaccines- Shingrix  Completed   HPV VACCINES  Aged Out    Health Maintenance  Health Maintenance Due  Topic Date Due   COVID-19 Vaccine (6 - 2023-24 season) 08/25/2023   Medicare Annual Wellness (AWV)  09/20/2023    Colorectal cancer screening: No longer required.   Lung Cancer Screening: (Low Dose CT Chest recommended if Age 33-80 years, 20 pack-year currently smoking OR have quit w/in 15years.) does not qualify.   Additional Screening:  Hepatitis C Screening: does not qualify  Vision Screening: Recommended annual ophthalmology exams for early detection of glaucoma and other disorders of the eye. Is the patient up to date with their annual eye exam?  Yes  Who is the provider or what is the name of the office in which the patient attends annual eye exams? Dr. Lottie Dawson If pt is not established with a provider, would  they like to be referred to a provider to establish care? No .   Dental Screening: Recommended annual dental exams for proper oral hygiene  Diabetic Foot Exam: N/a  Community Resource Referral / Chronic Care Management: CRR required this visit?  No   CCM required this visit?  No     Plan:     I have personally reviewed and noted the following in the patient's chart:   Medical and social history Use of alcohol, tobacco or illicit drugs  Current medications and supplements including opioid prescriptions. Patient is not currently taking opioid prescriptions. Functional ability and status Nutritional status Physical activity Advanced directives List of other physicians Hospitalizations, surgeries, and ER visits in previous 12 months Vitals Screenings to include cognitive, depression, and falls Referrals and appointments  In addition, I have reviewed and discussed with patient certain preventive protocols, quality metrics, and best practice recommendations. A written  personalized care plan for preventive services as well as general preventive health recommendations were provided to patient.     Donne Anon, CMA   10/10/2023   After Visit Summary: (MyChart) Due to this being a telephonic visit, the after visit summary with patients personalized plan was offered to patient via MyChart   Nurse Notes: None

## 2023-10-10 NOTE — Patient Instructions (Signed)
Darren Barnes , Thank you for taking time to come for your Medicare Wellness Visit. I appreciate your ongoing commitment to your health goals. Please review the following plan we discussed and let me know if I can assist you in the future.     This is a list of the screening recommended for you and due dates:  Health Maintenance  Topic Date Due   COVID-19 Vaccine (6 - 2023-24 season) 08/25/2023   Flu Shot  03/23/2024*   DTaP/Tdap/Td vaccine (3 - Tdap) 05/20/2024   Medicare Annual Wellness Visit  10/09/2024   Pneumonia Vaccine  Completed   Zoster (Shingles) Vaccine  Completed   HPV Vaccine  Aged Out  *Topic was postponed. The date shown is not the original due date.    Next appointment: Follow up in one year for your annual wellness visit.   Preventive Care 84 Years and Older, Male Preventive care refers to lifestyle choices and visits with your health care provider that can promote health and wellness. What does preventive care include? A yearly physical exam. This is also called an annual well check. Dental exams once or twice a year. Routine eye exams. Ask your health care provider how often you should have your eyes checked. Personal lifestyle choices, including: Daily care of your teeth and gums. Regular physical activity. Eating a healthy diet. Avoiding tobacco and drug use. Limiting alcohol use. Practicing safe sex. Taking low doses of aspirin every day. Taking vitamin and mineral supplements as recommended by your health care provider. What happens during an annual well check? The services and screenings done by your health care provider during your annual well check will depend on your age, overall health, lifestyle risk factors, and family history of disease. Counseling  Your health care provider may ask you questions about your: Alcohol use. Tobacco use. Drug use. Emotional well-being. Home and relationship well-being. Sexual activity. Eating habits. History of  falls. Memory and ability to understand (cognition). Work and work Astronomer. Screening  You may have the following tests or measurements: Height, weight, and BMI. Blood pressure. Lipid and cholesterol levels. These may be checked every 5 years, or more frequently if you are over 15 years old. Skin check. Lung cancer screening. You may have this screening every year starting at age 95 if you have a 30-pack-year history of smoking and currently smoke or have quit within the past 15 years. Fecal occult blood test (FOBT) of the stool. You may have this test every year starting at age 23. Flexible sigmoidoscopy or colonoscopy. You may have a sigmoidoscopy every 5 years or a colonoscopy every 10 years starting at age 65. Prostate cancer screening. Recommendations will vary depending on your family history and other risks. Hepatitis C blood test. Hepatitis B blood test. Sexually transmitted disease (STD) testing. Diabetes screening. This is done by checking your blood sugar (glucose) after you have not eaten for a while (fasting). You may have this done every 1-3 years. Abdominal aortic aneurysm (AAA) screening. You may need this if you are a current or former smoker. Osteoporosis. You may be screened starting at age 23 if you are at high risk. Talk with your health care provider about your test results, treatment options, and if necessary, the need for more tests. Vaccines  Your health care provider may recommend certain vaccines, such as: Influenza vaccine. This is recommended every year. Tetanus, diphtheria, and acellular pertussis (Tdap, Td) vaccine. You may need a Td booster every 10 years. Zoster vaccine. You  may need this after age 3. Pneumococcal 13-valent conjugate (PCV13) vaccine. One dose is recommended after age 69. Pneumococcal polysaccharide (PPSV23) vaccine. One dose is recommended after age 77. Talk to your health care provider about which screenings and vaccines you need and  how often you need them. This information is not intended to replace advice given to you by your health care provider. Make sure you discuss any questions you have with your health care provider. Document Released: 01/06/2016 Document Revised: 08/29/2016 Document Reviewed: 10/11/2015 Elsevier Interactive Patient Education  2017 [employer].  Fall Prevention in the Home Falls can cause injuries. They can happen to people of all ages. There are many things you can do to make your home safe and to help prevent falls. What can I do on the outside of my home? Regularly fix the edges of walkways and driveways and fix any cracks. Remove anything that might make you trip as you walk through a door, such as a raised step or threshold. Trim any bushes or trees on the path to your home. Use bright outdoor lighting. Clear any walking paths of anything that might make someone trip, such as rocks or tools. Regularly check to see if handrails are loose or broken. Make sure that both sides of any steps have handrails. Any raised decks and porches should have guardrails on the edges. Have any leaves, snow, or ice cleared regularly. Use sand or salt on walking paths during winter. Clean up any spills in your garage right away. This includes oil or grease spills. What can I do in the bathroom? Use night lights. Install grab bars by the toilet and in the tub and shower. Do not use towel bars as grab bars. Use non-skid mats or decals in the tub or shower. If you need to sit down in the shower, use a plastic, non-slip stool. Keep the floor dry. Clean up any water that spills on the floor as soon as it happens. Remove soap buildup in the tub or shower regularly. Attach bath mats securely with double-sided non-slip rug tape. Do not have throw rugs and other things on the floor that can make you trip. What can I do in the bedroom? Use night lights. Make sure that you have a light by your bed that is easy to  reach. Do not use any sheets or blankets that are too big for your bed. They should not hang down onto the floor. Have a firm chair that has side arms. You can use this for support while you get dressed. Do not have throw rugs and other things on the floor that can make you trip. What can I do in the kitchen? Clean up any spills right away. Avoid walking on wet floors. Keep items that you use a lot in easy-to-reach places. If you need to reach something above you, use a strong step stool that has a grab bar. Keep electrical cords out of the way. Do not use floor polish or wax that makes floors slippery. If you must use wax, use non-skid floor wax. Do not have throw rugs and other things on the floor that can make you trip. What can I do with my stairs? Do not leave any items on the stairs. Make sure that there are handrails on both sides of the stairs and use them. Fix handrails that are broken or loose. Make sure that handrails are as long as the stairways. Check any carpeting to make sure that it is firmly  attached to the stairs. Fix any carpet that is loose or worn. Avoid having throw rugs at the top or bottom of the stairs. If you do have throw rugs, attach them to the floor with carpet tape. Make sure that you have a light switch at the top of the stairs and the bottom of the stairs. If you do not have them, ask someone to add them for you. What else can I do to help prevent falls? Wear shoes that: Do not have high heels. Have rubber bottoms. Are comfortable and fit you well. Are closed at the toe. Do not wear sandals. If you use a stepladder: Make sure that it is fully opened. Do not climb a closed stepladder. Make sure that both sides of the stepladder are locked into place. Ask someone to hold it for you, if possible. Clearly mark and make sure that you can see: Any grab bars or handrails. First and last steps. Where the edge of each step is. Use tools that help you move  around (mobility aids) if they are needed. These include: Canes. Walkers. Scooters. Crutches. Turn on the lights when you go into a dark area. Replace any light bulbs as soon as they burn out. Set up your furniture so you have a clear path. Avoid moving your furniture around. If any of your floors are uneven, fix them. If there are any pets around you, be aware of where they are. Review your medicines with your doctor. Some medicines can make you feel dizzy. This can increase your chance of falling. Ask your doctor what other things that you can do to help prevent falls. This information is not intended to replace advice given to you by your health care provider. Make sure you discuss any questions you have with your health care provider. Document Released: 10/06/2009 Document Revised: 05/17/2016 Document Reviewed: 01/14/2015 Elsevier Interactive Patient Education  2017 [employer].

## 2023-10-13 ENCOUNTER — Other Ambulatory Visit: Payer: Self-pay | Admitting: Internal Medicine

## 2023-10-15 ENCOUNTER — Other Ambulatory Visit: Payer: Self-pay

## 2023-10-15 MED ORDER — DILTIAZEM HCL ER COATED BEADS 240 MG PO CP24: ORAL_CAPSULE | ORAL | 3 refills | Status: DC

## 2023-11-06 ENCOUNTER — Ambulatory Visit: Payer: Medicare PPO | Admitting: Internal Medicine

## 2023-11-06 ENCOUNTER — Encounter: Payer: Self-pay | Admitting: Internal Medicine

## 2023-11-06 VITALS — BP 136/80 | HR 75 | Temp 98.0°F | Resp 16 | Ht 69.0 in | Wt 171.2 lb

## 2023-11-06 DIAGNOSIS — N6452 Nipple discharge: Secondary | ICD-10-CM | POA: Diagnosis not present

## 2023-11-06 DIAGNOSIS — Z0000 Encounter for general adult medical examination without abnormal findings: Secondary | ICD-10-CM | POA: Diagnosis not present

## 2023-11-06 DIAGNOSIS — R399 Unspecified symptoms and signs involving the genitourinary system: Secondary | ICD-10-CM

## 2023-11-06 DIAGNOSIS — I10 Essential (primary) hypertension: Secondary | ICD-10-CM

## 2023-11-06 DIAGNOSIS — Z0001 Encounter for general adult medical examination with abnormal findings: Secondary | ICD-10-CM

## 2023-11-06 LAB — COMPREHENSIVE METABOLIC PANEL
ALT: 17 U/L (ref 0–53)
AST: 14 U/L (ref 0–37)
Albumin: 4.1 g/dL (ref 3.5–5.2)
Alkaline Phosphatase: 79 U/L (ref 39–117)
BUN: 14 mg/dL (ref 6–23)
CO2: 27 meq/L (ref 19–32)
Calcium: 9.2 mg/dL (ref 8.4–10.5)
Chloride: 107 meq/L (ref 96–112)
Creatinine, Ser: 1.13 mg/dL (ref 0.40–1.50)
GFR: 59.61 mL/min — ABNORMAL LOW (ref >60.00)
Glucose, Bld: 108 mg/dL — ABNORMAL HIGH (ref 70–99)
Potassium: 4.3 meq/L (ref 3.5–5.1)
Sodium: 141 meq/L (ref 135–145)
Total Bilirubin: 0.7 mg/dL (ref 0.2–1.2)
Total Protein: 6.9 g/dL (ref 6.0–8.3)

## 2023-11-06 LAB — TSH: TSH: 2.46 u[IU]/mL (ref 0.35–5.50)

## 2023-11-06 NOTE — Patient Instructions (Addendum)
  You need a mammogram. Please call the following number to get that scheduled. [phone]  Vaccines I recommend: RSV     GO TO THE LAB : Get the blood work     Next visit with me in 3 to 4 months. Please schedule it at the front desk      "Health Care Power of attorney" ,  "Living will" (Advance care planning documents)  If you already have a living will or healthcare power of attorney, is recommended you bring the copy to be scanned in your chart.   The document will be available to all the doctors you see in the system.  Advance care planning is a process that supports adults in  understanding and sharing their preferences regarding future medical care.  The patient's preferences are recorded in documents called Advance Directives and the can be modified at any time while the patient is in full mental capacity.   If you don't have one, please consider create one.      More information at: [email or web address]

## 2023-11-06 NOTE — Progress Notes (Signed)
Subjective:    Patient ID: Darren Barnes, male    DOB: 03-May-1941, 82 y.o.   MRN: [id number]  DOS:  11/06/2023 Type of visit - description: cpx  Here for CPX Chronic medical problems addressed.  Developed left-sided nipple discharge, unknown color, symptoms lasted for 2 months and they resolved. Denies any pain or swelling at the left nipple. Workup was done and negative.  History of prostate problems currently asymptomatic.  Denies chest pain or difficulty breathing. No nausea or vomiting.   Review of Systems See above   Past Medical History:  Diagnosis Date  . Cataract   . ED (erectile dysfunction)   . Glaucoma    both eyes  . Hx of adenomatous colonic polyps 07/30/2017  . Hyperlipidemia   . Hypertension   . Left inguinal hernia   . Nocturia    saw urology 2012, was eval, rx vesicare (after several other meds failed)  . Osteoarthritis   . Sleep apnea    no cpap used could not tolerate, mild osa per sleep study  . Thrombocytopenia (HCC)    saw hematology, observe    Past Surgical History:  Procedure Laterality Date  . anal polyp resection  2008   Dr Lisabeth Devoid  . Benign tumor from Left arm  yrs ago  . colonscopy  08/01/2020   polyps removed  . EYE SURGERY  02/22/2020   right eye sx for glaucoma  . INGUINAL HERNIA REPAIR Left 09/01/2020   Procedure: OPEN LEFT INGUINAL HERNIA REPAIR WITH MESH;  Surgeon: Abigail Miyamoto, MD;  Location: [facility];  Service: General;  Laterality: Left;  . KNEE ARTHROSCOPY  2004  . Sinus surgery for benign tumor  2000  . stem cell injections to both knees  2017    Current Outpatient Medications  Medication Instructions  . aspirin EC 81 mg, Oral, Daily, Swallow whole.  . brimonidine (ALPHAGAN) 0.2 % ophthalmic solution Place 1 drop into both eyes 2 times daily.  Marland Kitchen diltiazem (CARDIZEM CD) 240 MG 24 hr capsule TAKE 1 CAPSULE(240 MG) BY MOUTH IN THE MORNING AND AT BEDTIME  . dorzolamide-timolol (COSOPT) 22.3-6.8 MG/ML  ophthalmic solution 1 drop, Both Eyes, 2 times daily  . isosorbide mononitrate (IMDUR) 30 mg, Oral, Daily  . ketorolac (ACULAR) 0.5 % ophthalmic solution 1 drop, Left Eye, 2 times daily  . latanoprost (XALATAN) 0.005 % ophthalmic solution 1 drop, Right Eye, Daily at bedtime  . multivitamin (THERAGRAN) per tablet 1 tablet, Oral, Daily,    . nitroGLYCERIN (NITROSTAT) 0.4 mg, Sublingual, Every 5 min x3 PRN  . rosuvastatin (CRESTOR) 10 mg, Oral, Daily  . tamsulosin (FLOMAX) 0.4 mg, Oral, 2 times daily  . telmisartan (MICARDIS) 40 mg, Oral, Daily  . zolpidem (AMBIEN CR) 6.25 MG CR tablet TAKE 1 TABLET(6.25 MG) BY MOUTH AT BEDTIME AS NEEDED FOR SLEEP       Objective:   Physical Exam HENT:     Head:      Comments: As a 2 to 3 mm, elevated, hard, dark, slightly scaly skin lesion BP 136/80   Pulse 75   Temp 98 F (36.7 C) (Oral)   Resp 16   Ht 5\' 9"  (1.753 m)   Wt 171 lb 4 oz (77.7 kg)   SpO2 98%   BMI 25.29 kg/m  General: Well developed, frail appearing, in no distress. Neck: No  thyromegaly  HEENT:  Normocephalic . Face symmetric, atraumatic Breast: Nipples normal, she has breast tissue in both sides, slightly more noticeable  on the L side.  No clear-cut lump. No axillary lymphadenopathies. Lungs:  CTA B Normal respiratory effort, no intercostal retractions, no accessory muscle use. Heart: RRR,  no murmur.  Abdomen:  Not distended, soft, non-tender. No rebound or rigidity.   Lower extremities: no pretibial edema bilaterally   Neurologic:  alert & oriented X3.  Speech normal, gait and assist state, slow, small steps  Strength symmetric and appropriate for age.  Psych: Cognition and judgment appear intact.  Cooperative with normal attention span and concentration.  Behavior appropriate. No anxious or depressed appearing.     Assessment     Assessment   HTN- on Cardizem for a while, added losartan 10-2015 Hyperlipidemia ( Lipitor .  Aches ) ED DJD- s/p knee  injections (flexogenic) ~ 2016; I signed a parking permit OSA :  CPAP intolerant Glaucoma- LEGALLY BLIND  Abdominal wall lipoma (see OV 05-25-2016) H/o Thrombocytopenia, saw hematology, rx  observation GU: --H/o LUTS- Nocturia  Saw  Urologist ~2012 underwent full workup >> dx BPH and detrusor instability, failed several medications --Re-eval by urology:12-2016 Recurrence cystitis, chronic prostatitis Cardiac CT 02-23-21: CT FFR analysis shows multiple lesions of significance or possible significance: Mid RCA, distal RCA, Distal LAD, Distal LCX. Rx medical mgmt   PLAN: Here for CPX - Td 2015 - PNM 23: 2005, 2010, 06-28-21;  prevnar 2015 - zostavax 2010;  s/p shingrex - RSV discussed with patient -Had a recent covid and  flu shot --CCS: Last C-scope 2019, no further screening per GI letter - Prostate cancer screening: No further screening  --Height decreased -->   DEXA 05-2014 normal --Diet exercise : counseled --Labs: CMP, TSH. -Healthcare POA: Information provided. Nipple discharge: Left-sided, lasting 2 months, then self resolved.  Was seen at this office by one of my partners, labs were negative.  On physical exam today the left breast tissue is slightly more noticeable.  To complete the workup we will order mammograms.  Check a TSH as well. SK?  Also reports skin lesion at the scalp for few months, probably a SK, offered dermatology referral, declined it.  Will let me know if the area increases in size or bleed. HTN: BP looks very good, continue same meds.  Check a CMP. LUTS: Currently asymptomatic on tamsulosin. Sleep pattern: The wife reports that he is sleeping frequently in the daytime and wakes up early.  Encourage to avoid naps, continue Ambien at night.  Reassess on RTC RTC 3 to 4 months

## 2023-11-07 ENCOUNTER — Encounter: Payer: Self-pay | Admitting: Internal Medicine

## 2023-11-07 NOTE — Assessment & Plan Note (Signed)
Here for CPX Nipple discharge: Left-sided, lasting 2 months, then self resolved.  Was seen at this office by one of my partners, labs were negative.  On physical exam today the left breast tissue is slightly more noticeable.  To complete the workup we will order mammograms.  Check a TSH as well. SK?  Also reports skin lesion at the scalp for few months, probably a SK, offered dermatology referral, declined. Will let me know if the area increases in size or bleed. HTN: BP looks very good, continue same meds.  Check a CMP. LUTS: Currently asymptomatic on tamsulosin. Sleep pattern: The wife reports that he is sleeping frequently in the daytime and wakes up early.  Encourage to avoid naps, continue Ambien at night.  Reassess on RTC RTC 3 to 4 months

## 2023-11-07 NOTE — Assessment & Plan Note (Signed)
Here for CPX - Td 2015 - PNM 23: 2005, 2010, 06-28-21;  prevnar 2015 - zostavax 2010;  s/p shingrex - RSV discussed with patient -Had a recent covid and  flu shot --CCS: Last C-scope 2019, no further screening per GI letter - Prostate cancer screening: No further screening  --Height decreased -->   DEXA 05-2014 normal --Diet exercise : counseled --Labs: CMP, TSH. -Healthcare POA: Information provided.

## 2023-11-18 ENCOUNTER — Ambulatory Visit: Payer: Medicare PPO | Admitting: Podiatry

## 2023-11-18 ENCOUNTER — Encounter: Payer: Self-pay | Admitting: Podiatry

## 2023-11-18 DIAGNOSIS — B351 Tinea unguium: Secondary | ICD-10-CM

## 2023-11-18 DIAGNOSIS — D696 Thrombocytopenia, unspecified: Secondary | ICD-10-CM

## 2023-11-18 DIAGNOSIS — M79674 Pain in right toe(s): Secondary | ICD-10-CM

## 2023-11-18 DIAGNOSIS — M79675 Pain in left toe(s): Secondary | ICD-10-CM

## 2023-11-18 NOTE — Progress Notes (Signed)
This patient returns to my office for at risk foot care.  This patient requires this care by a professional since this patient will be at risk due to having thrombocytopenia.  This patient is unable to cut nails himself since the patient cannot reach his nails.These nails are painful walking and wearing shoes.  This patient presents for at risk foot care today.  General Appearance  Alert, conversant and in no acute stress.  Vascular  Dorsalis pedis and posterior tibial  pulses are palpable  bilaterally.  Capillary return is within normal limits  bilaterally. Temperature is within normal limits  bilaterally.  Neurologic  Senn-Weinstein monofilament wire test within normal limits  bilaterally. Muscle power within normal limits bilaterally.  Nails Thick disfigured discolored nails with subungual debris  from hallux to fifth toes bilaterally. No evidence of bacterial infection or drainage bilaterally.  Orthopedic  No limitations of motion  feet .  No crepitus or effusions noted.  No bony pathology or digital deformities noted.  Skin  normotropic skin with no porokeratosis noted bilaterally.  No signs of infections or ulcers noted.     Onychomycosis  Pain in right toes  Pain in left toes  Consent was obtained for treatment procedures.   Mechanical debridement of nails 1-5  bilaterally performed with a nail nipper.  Filed with dremel without incident.    Return office visit   3 months                    Told patient to return for periodic foot care and evaluation due to potential at risk complications.   Helane Gunther DPM

## 2023-11-19 ENCOUNTER — Telehealth: Payer: Self-pay | Admitting: Family

## 2023-11-20 NOTE — Telephone Encounter (Signed)
Vikki Ports (spouse DPR OK) called to follow up on refill statu. Per chart review, noted it was routed incorrectly to Worthy Rancher, NP. Per  Vikki Ports, pt is out of his medication and needs a refill when possible. Advised her of this and stated a HP message would be sent back. Routed HP due to routing error from pharmacy.

## 2023-11-20 NOTE — Telephone Encounter (Signed)
Requesting: Ambien 6.25mg   Contract: 02/12/23 UDS: Ambien only Last Visit: 11/06/23 Next Visit: 03/06/24 Last Refill: 09/19/23 #30 and 1RF   Please Advise

## 2023-11-25 NOTE — Telephone Encounter (Signed)
PDMP okay, Rx sent 

## 2023-11-29 ENCOUNTER — Ambulatory Visit
Admission: RE | Admit: 2023-11-29 | Discharge: 2023-11-29 | Disposition: A | Discharge status: Home / Self Care | Payer: Medicare PPO | Source: Ambulatory Visit | Attending: Internal Medicine | Admitting: Internal Medicine

## 2023-11-29 DIAGNOSIS — N6452 Nipple discharge: Secondary | ICD-10-CM | POA: Diagnosis not present

## 2024-01-29 ENCOUNTER — Other Ambulatory Visit: Payer: Self-pay | Admitting: Internal Medicine

## 2024-01-31 ENCOUNTER — Other Ambulatory Visit: Payer: Self-pay | Admitting: Internal Medicine

## 2024-02-17 ENCOUNTER — Ambulatory Visit: Payer: Medicare PPO | Admitting: Podiatry

## 2024-02-17 ENCOUNTER — Encounter: Payer: Self-pay | Admitting: Podiatry

## 2024-02-17 DIAGNOSIS — M79675 Pain in left toe(s): Secondary | ICD-10-CM

## 2024-02-17 DIAGNOSIS — M79674 Pain in right toe(s): Secondary | ICD-10-CM

## 2024-02-17 DIAGNOSIS — D696 Thrombocytopenia, unspecified: Secondary | ICD-10-CM

## 2024-02-17 DIAGNOSIS — B351 Tinea unguium: Secondary | ICD-10-CM | POA: Diagnosis not present

## 2024-02-17 NOTE — Progress Notes (Signed)
 This patient returns to my office for at risk foot care.  This patient requires this care by a professional since this patient will be at risk due to having thrombocytopenia.  This patient is unable to cut nails himself since the patient cannot reach his nails.These nails are painful walking and wearing shoes.  This patient presents for at risk foot care today.  General Appearance  Alert, conversant and in no acute stress.  Vascular  Dorsalis pedis and posterior tibial  pulses are palpable  bilaterally.  Capillary return is within normal limits  bilaterally. Temperature is within normal limits  bilaterally.  Neurologic  Senn-Weinstein monofilament wire test within normal limits  bilaterally. Muscle power within normal limits bilaterally.  Nails Thick disfigured discolored nails with subungual debris  from hallux to fifth toes bilaterally. No evidence of bacterial infection or drainage bilaterally.  Orthopedic  No limitations of motion  feet .  No crepitus or effusions noted.  No bony pathology or digital deformities noted.  Skin  normotropic skin with no porokeratosis noted bilaterally.  No signs of infections or ulcers noted.     Onychomycosis  Pain in right toes  Pain in left toes  Consent was obtained for treatment procedures.   Mechanical debridement of nails 1-5  bilaterally performed with a nail nipper.  Filed with dremel without incident.    Return office visit   3 months                    Told patient to return for periodic foot care and evaluation due to potential at risk complications.   Helane Gunther DPM

## 2024-02-24 ENCOUNTER — Other Ambulatory Visit: Payer: Self-pay | Admitting: Internal Medicine

## 2024-03-02 DIAGNOSIS — H401133 Primary open-angle glaucoma, bilateral, severe stage: Secondary | ICD-10-CM | POA: Diagnosis not present

## 2024-03-06 ENCOUNTER — Ambulatory Visit: Payer: Medicare PPO | Admitting: Internal Medicine

## 2024-03-20 ENCOUNTER — Ambulatory Visit: Admitting: Internal Medicine

## 2024-03-20 ENCOUNTER — Encounter: Payer: Self-pay | Admitting: Internal Medicine

## 2024-03-20 VITALS — BP 138/76 | HR 57 | Temp 98.1°F | Resp 16 | Ht 69.0 in | Wt 177.0 lb

## 2024-03-20 DIAGNOSIS — R739 Hyperglycemia, unspecified: Secondary | ICD-10-CM

## 2024-03-20 DIAGNOSIS — R627 Adult failure to thrive: Secondary | ICD-10-CM

## 2024-03-20 DIAGNOSIS — I10 Essential (primary) hypertension: Secondary | ICD-10-CM | POA: Diagnosis not present

## 2024-03-20 DIAGNOSIS — R399 Unspecified symptoms and signs involving the genitourinary system: Secondary | ICD-10-CM

## 2024-03-20 LAB — CBC WITH DIFFERENTIAL/PLATELET
Basophils Absolute: 0 10*3/uL (ref 0.0–0.1)
Basophils Relative: 0.6 % (ref 0.0–3.0)
Eosinophils Absolute: 0.3 10*3/uL (ref 0.0–0.7)
Eosinophils Relative: 5 % (ref 0.0–5.0)
HCT: 42 % (ref 39.0–52.0)
Hemoglobin: 13.6 g/dL (ref 13.0–17.0)
Lymphocytes Relative: 23.5 % (ref 12.0–46.0)
Lymphs Abs: 1.4 10*3/uL (ref 0.7–4.0)
MCHC: 32.4 g/dL (ref 30.0–36.0)
MCV: 86.5 fl (ref 78.0–100.0)
Monocytes Absolute: 0.8 10*3/uL (ref 0.1–1.0)
Monocytes Relative: 13.6 % — ABNORMAL HIGH (ref 3.0–12.0)
Neutro Abs: 3.3 10*3/uL (ref 1.4–7.7)
Neutrophils Relative %: 57.3 % (ref 43.0–77.0)
Platelets: 92 10*3/uL — ABNORMAL LOW (ref 150.0–400.0)
RBC: 4.86 Mil/uL (ref 4.22–5.81)
RDW: 12.8 % (ref 11.5–15.5)
WBC: 5.8 10*3/uL (ref 4.0–10.5)

## 2024-03-20 LAB — URINALYSIS, ROUTINE W REFLEX MICROSCOPIC
Bilirubin Urine: NEGATIVE
Hgb urine dipstick: NEGATIVE
Ketones, ur: NEGATIVE
Nitrite: NEGATIVE
Specific Gravity, Urine: 1.01 (ref 1.000–1.030)
Total Protein, Urine: NEGATIVE
Urine Glucose: NEGATIVE
Urobilinogen, UA: 2 — AB (ref 0.0–1.0)
pH: 6 (ref 5.0–8.0)

## 2024-03-20 LAB — BASIC METABOLIC PANEL WITH GFR
BUN: 13 mg/dL (ref 6–23)
CO2: 27 meq/L (ref 19–32)
Calcium: 8.8 mg/dL (ref 8.4–10.5)
Chloride: 106 meq/L (ref 96–112)
Creatinine, Ser: 1.09 mg/dL (ref 0.40–1.50)
GFR: 62.08 mL/min (ref >60.00)
Glucose, Bld: 91 mg/dL (ref 70–99)
Potassium: 4.1 meq/L (ref 3.5–5.1)
Sodium: 140 meq/L (ref 135–145)

## 2024-03-20 LAB — PSA: PSA: 3.16 ng/mL (ref 0.10–4.00)

## 2024-03-20 NOTE — Progress Notes (Signed)
 Subjective:    Patient ID: Darren Barnes, male    DOB: Apr 28, 1941, 83 y.o.   MRN: [id number]  DOS:  03/20/2024 Type of visit - description: Routine follow-up  Here with his wife. Reports that recently he started to have a difficult time holding his urine and sometimes is not able to reach the bathroom. Denies dysuria, no change in the color of the urine, no fever or chills. No back pain, no lower extremity paresthesias, no stool incontinence.  Wife thinks that overall he is weaker than last year.  Appetite is okay, no recent falls. No major memory issues, just forgetful.  No confusion.   Review of Systems See above   Past Medical History:  Diagnosis Date   Cataract    ED (erectile dysfunction)    Glaucoma    both eyes   Hx of adenomatous colonic polyps 07/30/2017   Hyperlipidemia    Hypertension    Left inguinal hernia    Nocturia    saw urology 2012, was eval, rx vesicare (after several other meds failed)   Osteoarthritis    Sleep apnea    no cpap used could not tolerate, mild osa per sleep study   Thrombocytopenia (HCC)    saw hematology, observe    Past Surgical History:  Procedure Laterality Date   anal polyp resection  2008   Dr Lisabeth Devoid   Benign tumor from Left arm  yrs ago   colonscopy  08/01/2020   polyps removed   EYE SURGERY  02/22/2020   right eye sx for glaucoma   INGUINAL HERNIA REPAIR Left 09/01/2020   Procedure: OPEN LEFT INGUINAL HERNIA REPAIR WITH MESH;  Surgeon: Abigail Miyamoto, MD;  Location: [facility] ;  Service: General;  Laterality: Left;   KNEE ARTHROSCOPY  2004   Sinus surgery for benign tumor  2000   stem cell injections to both knees  2017    Current Outpatient Medications  Medication Instructions   aspirin EC 81 mg, Daily   brimonidine (ALPHAGAN) 0.2 % ophthalmic solution 1 drop, Both Eyes, 3 times daily   diltiazem (CARDIZEM CD) 240 MG 24 hr capsule TAKE 1 CAPSULE(240 MG) BY MOUTH IN THE MORNING AND AT BEDTIME    dorzolamide-timolol (COSOPT) 22.3-6.8 MG/ML ophthalmic solution 1 drop, 2 times daily   isosorbide mononitrate (IMDUR) 30 mg, Oral, Daily   ketorolac (ACULAR) 0.5 % ophthalmic solution 1 drop, 2 times daily   latanoprost (XALATAN) 0.005 % ophthalmic solution 1 drop, Daily at bedtime   multivitamin (THERAGRAN) per tablet 1 tablet, Daily   nitroGLYCERIN (NITROSTAT) 0.4 mg, Sublingual, Every 5 min x3 PRN   rosuvastatin (CRESTOR) 10 MG tablet TAKE 1 TABLET(10 MG) BY MOUTH DAILY   tamsulosin (FLOMAX) 0.4 mg, Oral, 2 times daily   telmisartan (MICARDIS) 40 mg, Oral, Daily   zolpidem (AMBIEN CR) 6.25 MG CR tablet TAKE 1 TABLET(6.25 MG) BY MOUTH AT BEDTIME AS NEEDED FOR SLEEP       Objective:   Physical Exam BP 138/76   Pulse (!) 57   Temp 98.1 F (36.7 C) (Oral)   Resp 16   Ht 5\' 9"  (1.753 m)   Wt 177 lb (80.3 kg)   SpO2 96%   BMI 26.14 kg/m  General:   Well developed, elderly, blind gentleman.  Nontoxic appearing, HEENT:  Normocephalic . Face symmetric, atraumatic Lungs:  CTA B Normal respiratory effort, no intercostal retractions, no accessory muscle use. Heart: RRR,  no murmur. Lower abdomen: No TTP DRE: Normal  sphincter tone, brown stools, prostate slightly asymmetric, larger on the right but not tender or nodular. Lower extremities: no pretibial edema bilaterally  Skin: Not pale. Not jaundice Neurologic:  alert & oriented X3.  Speech normal, gait: Walks with assistant with his wife, gait is very slow.  Small steps. Psych--  Cognition and judgment appear intact.  Cooperative with normal attention span and concentration.  Behavior appropriate. No anxious or depressed appearing.      Assessment     Assessment   HTN- on Cardizem for a while, added losartan 10-2015 Hyperlipidemia ( Lipitor .  Aches ) ED DJD- s/p knee injections (flexogenic) ~ 2016; I signed a parking permit OSA :  CPAP intolerant Glaucoma- LEGALLY BLIND  Abdominal wall lipoma (see OV 05-25-2016) H/o  Thrombocytopenia, saw hematology, rx  observation GU: --H/o LUTS- Nocturia  Saw  Urologist ~2012 underwent full workup >> dx BPH and detrusor instability, failed several medications --Re-eval by urology:12-2016 Recurrence cystitis, chronic prostatitis Cardiac CT 02-23-21: CT FFR analysis shows multiple lesions of significance or possible significance: Mid RCA, distal RCA, Distal LAD, Distal LCX. Rx medical mgmt   PLAN: HTN: BP looks good, continue Cardizem, Imdur, Micardis, check a BMP and CBC. LUTS: Recently has noted some difficulty reaching the bathroom, no bowel incontinence, no back pain.  Prostate is slightly asymmetric. Plan: UA urine culture and PSA.  Hold tamsulosin for a week or 2 to see if that helps.  Further advised with results. Hyperglycemia: Mild, per chart review, check A1c Failure to thrive: Wife noted that he is overall weaker compared to last year, on exam that seems to be the case.  Some forgetfulness.  Will continue monitoring the situation, fall prevention discussed. Nipple discharge: See LOV, B MMG and [place] left breast negative.  No further symptoms RTC 4 months

## 2024-03-20 NOTE — Assessment & Plan Note (Addendum)
 HTN: BP looks good, continue Cardizem, Imdur, Micardis, check a BMP and CBC. LUTS: Recently has noted some difficulty reaching the bathroom, no bowel incontinence, no back pain.  Prostate is slightly asymmetric. Plan: UA urine culture and PSA.  Hold tamsulosin for a week or 2 to see if that helps.  Further advised with results. Hyperglycemia: Mild, per chart review, check A1c Failure to thrive: Wife noted that he is overall weaker compared to last year, on exam that seems to be the case.  Some forgetfulness.  Will continue monitoring the situation, fall prevention discussed. Nipple discharge: See LOV, B MMG and [place] left breast negative.  No further symptoms RTC 4 months

## 2024-03-20 NOTE — Patient Instructions (Addendum)
 Hold tamsulosin for 1 or 2 weeks.  See if that help with your urine  GO TO THE LAB : Get the blood work     Please go to the front desk:  Arrange for a follow-up in 4 months Fall Prevention in the Home, Adult Falls can cause injuries and affect people of all ages. There are many simple things that you can do to make your home safe and to help prevent falls. If you need it, ask for help making these changes. What actions can I take to prevent falls? General information Use good lighting in all rooms. Make sure to: Replace any light bulbs that burn out. Turn on lights if it is dark and use night-lights. Keep items that you use often in easy-to-reach places. Lower the shelves around your home if needed. Move furniture so that there are clear paths around it. Do not keep throw rugs or other things on the floor that can make you trip. If any of your floors are uneven, fix them. Add color or contrast paint or tape to clearly mark and help you see: Grab bars or handrails. First and last steps of staircases. Where the edge of each step is. If you use a ladder or stepladder: Make sure that it is fully opened. Do not climb a closed ladder. Make sure the sides of the ladder are locked in place. Have someone hold the ladder while you use it. Know where your pets are as you move through your home. What can I do in the bathroom?     Keep the floor dry. Clean up any water that is on the floor right away. Remove soap buildup in the bathtub or shower. Buildup makes bathtubs and showers slippery. Use non-skid mats or decals on the floor of the bathtub or shower. Attach bath mats securely with double-sided, non-slip rug tape. If you need to sit down while you are in the shower, use a non-slip stool. Install grab bars by the toilet and in the bathtub and shower. Do not use towel bars as grab bars. What can I do in the bedroom? Make sure that you have a light by your bed that is easy to  reach. Do not use any sheets or blankets on your bed that hang to the floor. Have a firm bench or chair with side arms that you can use for support when you get dressed. What can I do in the kitchen? Clean up any spills right away. If you need to reach something above you, use a sturdy step stool that has a grab bar. Keep electrical cables out of the way. Do not use floor polish or wax that makes floors slippery. What can I do with my stairs? Do not leave anything on the stairs. Make sure that you have a light switch at the top and the bottom of the stairs. Have them installed if you do not have them. Make sure that there are handrails on both sides of the stairs. Fix handrails that are broken or loose. Make sure that handrails are as long as the staircases. Install non-slip stair treads on all stairs in your home if they do not have carpet. Avoid having throw rugs at the top or bottom of stairs, or secure the rugs with carpet tape to prevent them from moving. Choose a carpet design that does not hide the edge of steps on the stairs. Make sure that carpet is firmly attached to the stairs. Fix any carpet that is  loose or worn. What can I do on the outside of my home? Use bright outdoor lighting. Repair the edges of walkways and driveways and fix any cracks. Clear paths of anything that can make you trip, such as tools or rocks. Add color or contrast paint or tape to clearly mark and help you see high doorway thresholds. Trim any bushes or trees on the main path into your home. Check that handrails are securely fastened and in good repair. Both sides of all steps should have handrails. Install guardrails along the edges of any raised decks or porches. Have leaves, snow, and ice cleared regularly. Use sand, salt, or ice melt on walkways during winter months if you live where there is ice and snow. In the garage, clean up any spills right away, including grease or oil spills. What other actions  can I take? Review your medicines with your health care provider. Some medicines can make you confused or feel dizzy. This can increase your chance of falling. Wear closed-toe shoes that fit well and support your feet. Wear shoes that have rubber soles and low heels. Use a cane, walker, scooter, or crutches that help you move around if needed. Talk with your provider about other ways that you can decrease your risk of falls. This may include seeing a physical therapist to learn to do exercises to improve movement and strength. Where to find more information Centers for Disease Control and Prevention, STEADI: TonerPromos.no [employer] on Aging: BaseRingTones.pl National Institute on Aging: BaseRingTones.pl Contact a health care provider if: You are afraid of falling at home. You feel weak, drowsy, or dizzy at home. You fall at home. Get help right away if you: Lose consciousness or have trouble moving after a fall. Have a fall that causes a head injury. These symptoms may be an emergency. Get help right away. Call 911. Do not wait to see if the symptoms will go away. Do not drive yourself to the hospital. This information is not intended to replace advice given to you by your health care provider. Make sure you discuss any questions you have with your health care provider. Document Revised: 08/13/2022 Document Reviewed: 08/13/2022 Elsevier Patient Education  2024 [employer].

## 2024-03-21 LAB — URINE CULTURE
MICRO NUMBER:: 16261090
Result:: NO GROWTH
SPECIMEN QUALITY:: ADEQUATE

## 2024-03-21 LAB — HEMOGLOBIN A1C: Hgb A1c MFr Bld: 5.2 % (ref 4.6–6.5)

## 2024-03-23 ENCOUNTER — Other Ambulatory Visit: Payer: Self-pay | Admitting: Internal Medicine

## 2024-03-23 NOTE — Telephone Encounter (Signed)
 PDMP okay, Rx sent

## 2024-03-23 NOTE — Telephone Encounter (Signed)
 Requesting: zolpidem CR 6.25mg  Contract: No  will update at next visit UDS: no Last Visit: 03/20/2024 Next Visit: 07/21/2024 Last Refill: 11/25/23  Please Advise

## 2024-03-24 MED ORDER — SULFAMETHOXAZOLE-TRIMETHOPRIM 800-160 MG PO TABS: 1.0000 | ORAL_TABLET | Freq: Two times a day (BID) | ORAL | 0 refills | Status: DC

## 2024-03-24 NOTE — Addendum Note (Signed)
 Addended byConrad Nemacolin D on: 03/24/2024 08:15 AM   Modules accepted: Orders

## 2024-04-15 DIAGNOSIS — H401133 Primary open-angle glaucoma, bilateral, severe stage: Secondary | ICD-10-CM | POA: Diagnosis not present

## 2024-04-27 ENCOUNTER — Other Ambulatory Visit: Payer: Self-pay | Admitting: Internal Medicine

## 2024-04-27 ENCOUNTER — Other Ambulatory Visit: Payer: Self-pay | Admitting: Physician Assistant

## 2024-05-20 ENCOUNTER — Encounter: Payer: Self-pay | Admitting: Podiatry

## 2024-05-20 ENCOUNTER — Ambulatory Visit: Payer: Medicare PPO | Admitting: Podiatry

## 2024-05-20 DIAGNOSIS — M79675 Pain in left toe(s): Secondary | ICD-10-CM | POA: Diagnosis not present

## 2024-05-20 DIAGNOSIS — M79674 Pain in right toe(s): Secondary | ICD-10-CM

## 2024-05-20 DIAGNOSIS — D696 Thrombocytopenia, unspecified: Secondary | ICD-10-CM | POA: Diagnosis not present

## 2024-05-20 DIAGNOSIS — B351 Tinea unguium: Secondary | ICD-10-CM

## 2024-05-20 NOTE — Progress Notes (Signed)
 This patient returns to my office for at risk foot care.  This patient requires this care by a professional since this patient will be at risk due to having thrombocytopenia.  This patient is unable to cut nails himself since the patient cannot reach his nails.These nails are painful walking and wearing shoes.  This patient presents for at risk foot care today.  General Appearance  Alert, conversant and in no acute stress.  Vascular  Dorsalis pedis and posterior tibial  pulses are palpable  bilaterally.  Capillary return is within normal limits  bilaterally. Temperature is within normal limits  bilaterally.  Neurologic  Senn-Weinstein monofilament wire test within normal limits  bilaterally. Muscle power within normal limits bilaterally.  Nails Thick disfigured discolored nails with subungual debris  from hallux to fifth toes bilaterally. No evidence of bacterial infection or drainage bilaterally.  Orthopedic  No limitations of motion  feet .  No crepitus or effusions noted.  No bony pathology or digital deformities noted.  Skin  normotropic skin with no porokeratosis noted bilaterally.  No signs of infections or ulcers noted.     Onychomycosis  Pain in right toes  Pain in left toes  Consent was obtained for treatment procedures.   Mechanical debridement of nails 1-5  bilaterally performed with a nail nipper.  Filed with dremel without incident.    Return office visit   3 months                    Told patient to return for periodic foot care and evaluation due to potential at risk complications.   Helane Gunther DPM

## 2024-05-28 ENCOUNTER — Other Ambulatory Visit: Payer: Self-pay | Admitting: Internal Medicine

## 2024-06-15 ENCOUNTER — Telehealth: Payer: Self-pay | Admitting: Internal Medicine

## 2024-06-15 NOTE — Telephone Encounter (Signed)
 Requesting: Ambien  CR 6.25mg   Contract: 02/12/23 UDS: None Last Visit: 03/20/24 Next Visit: 07/21/24 Last Refill: 03/23/24 #30 and 2RF   Please Advise

## 2024-06-15 NOTE — Telephone Encounter (Signed)
 PDMP okay, Rx sent

## 2024-06-24 ENCOUNTER — Other Ambulatory Visit: Payer: Self-pay | Admitting: Internal Medicine

## 2024-07-01 ENCOUNTER — Ambulatory Visit: Payer: Self-pay

## 2024-07-01 NOTE — Telephone Encounter (Signed)
 FYI Only or Action Required?: FYI only for provider.  Patient was last seen in primary care on 03/20/2024 by Amon Aloysius BRAVO, MD.  Called Nurse Triage reporting Fatigue.  Symptoms began 2-3 months ago.  Interventions attempted: Nothing.  Symptoms are: gradually worsening.  Triage Disposition: See PCP When Office is Open (Within 3 Days)  Patient/caregiver understands and will follow disposition?: Yes   **Patient has appt. On 7/16**  Wife called in stating that the patient is getting increasingly weaker, with loss of memory, as well as increased frustration. Symptoms have been ongoing x 2 months. Pt. Is repeating words and becoming more forgetful. No other symptoms noted, she mentioned possible depression after a year ago dx. Of vision loss. Hs ADL's are decreasing.                                  Copied from CRM [id number]. Topic: Clinical - Red Word Triage >> Jul 01, 2024  4:07 PM [place] E wrote: [facility] that prompted transfer to Nurse Triage: Wife called in stating that patient is sleeping more than normal, possible depression. Early signs of dementia, wife stated he is having memory less and body weakness. Reason for Disposition  [1] Fatigue (i.e., tires easily, decreased energy) AND [2] persists > 1 week  Protocols used: Weakness (Generalized) and Fatigue-A-AH

## 2024-07-08 ENCOUNTER — Encounter: Payer: Self-pay | Admitting: Internal Medicine

## 2024-07-08 ENCOUNTER — Ambulatory Visit: Admitting: Internal Medicine

## 2024-07-08 VITALS — BP 132/62 | HR 62 | Temp 97.9°F | Resp 16 | Ht 69.0 in | Wt 165.1 lb

## 2024-07-08 DIAGNOSIS — E785 Hyperlipidemia, unspecified: Secondary | ICD-10-CM | POA: Diagnosis not present

## 2024-07-08 DIAGNOSIS — R399 Unspecified symptoms and signs involving the genitourinary system: Secondary | ICD-10-CM

## 2024-07-08 DIAGNOSIS — I10 Essential (primary) hypertension: Secondary | ICD-10-CM | POA: Diagnosis not present

## 2024-07-08 DIAGNOSIS — F0390 Unspecified dementia without behavioral disturbance: Secondary | ICD-10-CM | POA: Diagnosis not present

## 2024-07-08 LAB — COMPREHENSIVE METABOLIC PANEL WITH GFR
ALT: 19 U/L (ref 0–53)
AST: 17 U/L (ref 0–37)
Albumin: 3.9 g/dL (ref 3.5–5.2)
Alkaline Phosphatase: 62 U/L (ref 39–117)
BUN: 15 mg/dL (ref 6–23)
CO2: 28 meq/L (ref 19–32)
Calcium: 9 mg/dL (ref 8.4–10.5)
Chloride: 108 meq/L (ref 96–112)
Creatinine, Ser: 1.18 mg/dL (ref 0.40–1.50)
GFR: 56.32 mL/min — ABNORMAL LOW (ref >60.00)
Glucose, Bld: 102 mg/dL — ABNORMAL HIGH (ref 70–99)
Potassium: 4.5 meq/L (ref 3.5–5.1)
Sodium: 142 meq/L (ref 135–145)
Total Bilirubin: 0.7 mg/dL (ref 0.2–1.2)
Total Protein: 6.4 g/dL (ref 6.0–8.3)

## 2024-07-08 LAB — URINALYSIS, ROUTINE W REFLEX MICROSCOPIC
Bilirubin Urine: NEGATIVE
Hgb urine dipstick: NEGATIVE
Ketones, ur: NEGATIVE
Leukocytes,Ua: NEGATIVE
Nitrite: NEGATIVE
RBC / HPF: NONE SEEN (ref 0–?)
Specific Gravity, Urine: 1.01 (ref 1.000–1.030)
Total Protein, Urine: NEGATIVE
Urine Glucose: NEGATIVE
Urobilinogen, UA: 1 (ref 0.0–1.0)
WBC, UA: NONE SEEN (ref 0–?)
pH: 6.5 (ref 5.0–8.0)

## 2024-07-08 LAB — LIPID PANEL
Cholesterol: 104 mg/dL (ref 0–200)
HDL: 45.2 mg/dL (ref >39.00)
LDL Cholesterol: 47 mg/dL (ref 0–99)
NonHDL: 58.7
Total CHOL/HDL Ratio: 2
Triglycerides: 59 mg/dL (ref 0.0–149.0)
VLDL: 11.8 mg/dL (ref 0.0–40.0)

## 2024-07-08 LAB — B12 AND FOLATE PANEL
Folate: >23.4 ng/mL (ref >5.9)
Vitamin B-12: 977 pg/mL — ABNORMAL HIGH (ref 211–911)

## 2024-07-08 LAB — TSH: TSH: 2.32 u[IU]/mL (ref 0.35–5.50)

## 2024-07-08 LAB — SEDIMENTATION RATE: Sed Rate: 2 mm/h (ref 0–20)

## 2024-07-08 MED ORDER — DONEPEZIL HCL 5 MG PO TABS: 5.0000 mg | ORAL_TABLET | Freq: Every day | ORAL | 0 refills | Status: DC

## 2024-07-08 NOTE — Patient Instructions (Signed)
 Start Aricept  5 mg 1 tablet daily.  Okay to take it in the morning.   We are arranging for a MRI of the brain.   GO TO THE LAB :  Get the blood work   Your results will be posted on MyChart with my comments  Next office visit for a checkup in 3 months Please make an appointment before you leave today

## 2024-07-08 NOTE — Progress Notes (Unsigned)
 Your urine culture CMP  Subjective:    Patient ID: Darren Barnes, male    DOB: [birth year], 83 y.o.   MRN: [id number]  DOS:  07/08/2024 Type of visit - description: Here for memory evaluation.  Here with his wife. She is concerned about pt's memory. He wakes up in the middle of the night and states the bathroom is not there. Decreased energy. Sleeps during the day for many hours and tend to be more awake at night.  The patient denies all of the above. States he feels wells, has no concerns. No nausea or vomiting. Some urinary incontinence but no dysuria or gross hematuria. No headache or fever. No aggressive behavior, no wandering  Review of Systems See above   Past Medical History:  Diagnosis Date   Cataract    ED (erectile dysfunction)    Glaucoma    both eyes   Hx of adenomatous colonic polyps 07/30/2017   Hyperlipidemia    Hypertension    Left inguinal hernia    Nocturia    saw urology 2012, was eval, rx vesicare (after several other meds failed)   Osteoarthritis    Sleep apnea    no cpap used could not tolerate, mild osa per sleep study   Thrombocytopenia (HCC)    saw hematology, observe    Past Surgical History:  Procedure Laterality Date   anal polyp resection  2008   Dr Lavon   Benign tumor from Left arm  yrs ago   colonscopy  08/01/2020   polyps removed   EYE SURGERY  02/22/2020   right eye sx for glaucoma   INGUINAL HERNIA REPAIR Left 09/01/2020   Procedure: OPEN LEFT INGUINAL HERNIA REPAIR WITH MESH;  Surgeon: Vernetta Berg, MD;  Location: [facility];  Service: General;  Laterality: Left;   KNEE ARTHROSCOPY  2004   Sinus surgery for benign tumor  2000   stem cell injections to both knees  2017    Current Outpatient Medications  Medication Instructions   aspirin EC 81 mg, Daily   brimonidine (ALPHAGAN) 0.2 % ophthalmic solution 1 drop, 3 times daily   diltiazem  (CARDIZEM  CD) 240 MG 24 hr capsule TAKE 1 CAPSULE(240 MG) BY MOUTH IN  THE MORNING AND AT BEDTIME   donepezil  (ARICEPT ) 5 mg, Oral, Daily at bedtime   dorzolamide-timolol (COSOPT) 22.3-6.8 MG/ML ophthalmic solution 1 drop, 2 times daily   isosorbide  mononitrate (IMDUR ) 30 MG 24 hr tablet TAKE 1 TABLET(30 MG) BY MOUTH DAILY   ketorolac (ACULAR) 0.5 % ophthalmic solution 1 drop, 2 times daily   latanoprost (XALATAN) 0.005 % ophthalmic solution 1 drop, Daily at bedtime   multivitamin (THERAGRAN) per tablet 1 tablet, Daily   nitroGLYCERIN  (NITROSTAT ) 0.4 mg, Sublingual, Every 5 min x3 PRN   rosuvastatin  (CRESTOR ) 10 MG tablet TAKE 1 TABLET(10 MG) BY MOUTH DAILY   tamsulosin  (FLOMAX ) 0.4 mg, Oral, 2 times daily   telmisartan  (MICARDIS ) 40 mg, Oral, Daily   zolpidem  (AMBIEN  CR) 6.25 MG CR tablet TAKE 1 TABLET(6.25 MG) BY MOUTH AT BEDTIME AS NEEDED FOR SLEEP       Objective:   Physical Exam BP 132/62   Pulse 62   Temp 97.9 F (36.6 C) (Oral)   Resp 16   Ht 5' 9 (1.753 m)   Wt 165 lb 2 oz (74.9 kg)   SpO2 97%   BMI 24.38 kg/m  General:   Well developed, NAD, BMI noted. HEENT:  Normocephalic . Face symmetric, atraumatic Lungs:  CTA  B Normal respiratory effort, no intercostal retractions, no accessory muscle use. Heart: RRR,  no murmur.  Lower extremities: no pretibial edema bilaterally  Skin: Not pale. Not jaundice Neurologic:  alert & oriented to self place.  Not oriented on time.  Has a hard time finding words. Speech normal, shuffling gait. Psych--   Behavior appropriate. No anxious or depressed appearing.      Assessment     Assessment   HTN- on Cardizem  for a while, added losartan  10-2015 Hyperlipidemia ( Lipitor .  Aches ) ED DJD- s/p knee injections (flexogenic) ~ 2016; I signed a parking permit OSA :  CPAP intolerant Glaucoma- LEGALLY BLIND  Abdominal wall lipoma (see OV 05-25-2016) H/o Thrombocytopenia, saw hematology, rx  observation GU: --H/o LUTS- Nocturia  Saw  Urologist ~2012 underwent full workup >> dx BPH and detrusor  instability, failed several medications --Re-eval by urology:12-2016 Recurrence cystitis, chronic prostatitis Cardiac CT 02-23-21: CT FFR analysis shows multiple lesions of significance or possible significance: Mid RCA, distal RCA, Distal LAD, Distal LCX. Rx medical mgmt   PLAN: Dementia: Symptoms are slowly developing, diagnosis is dementia without behavioral issues.  No obvious physical symptoms except shuffling gait.  Does not seems to be depressed.  Will check B12 folic acid.  RPR sed rate.  TSH.  Also on brain MRI. Pros and cons of medication discussed, trial with Aricept  5 mg. Will need MMSE on RTC HTN: Well-controlled.  No change High cholesterol: On Crestor , check FLP. RTC 3 months

## 2024-07-09 LAB — URINE CULTURE
MICRO NUMBER:: 16706381
SPECIMEN QUALITY:: ADEQUATE

## 2024-07-09 LAB — RPR: RPR Ser Ql: NONREACTIVE

## 2024-07-09 NOTE — Assessment & Plan Note (Signed)
 Dementia: Symptoms are slowly developing, diagnosis is dementia without behavioral issues.  No obvious physical symptoms except shuffling gait.  Does not seems to be depressed.  Will check B12 folic acid.  RPR sed rate.  TSH.  Also on brain MRI. Pros and cons of medication discussed, trial with Aricept  5 mg. Will need MMSE on RTC HTN: Well-controlled.  No change High cholesterol: On Crestor , check FLP. RTC 3 months

## 2024-07-11 ENCOUNTER — Ambulatory Visit: Payer: Self-pay | Admitting: Internal Medicine

## 2024-07-11 ENCOUNTER — Other Ambulatory Visit: Payer: Self-pay | Admitting: Internal Medicine

## 2024-07-21 ENCOUNTER — Telehealth: Payer: Self-pay

## 2024-07-21 ENCOUNTER — Ambulatory Visit: Admitting: Internal Medicine

## 2024-07-21 NOTE — Telephone Encounter (Signed)
 Copied from CRM [id number]. Topic: Appointments - Scheduling Inquiry for Clinic >> Jul 21, 2024 11:42 AM Deleta RAMAN wrote: Reason for CRM: patient wife is calling due to him having a MRI referral from pcp. She would like to know updates regarding this request please contact patient at [phone]

## 2024-07-21 NOTE — Telephone Encounter (Signed)
 Can someone check status of MRI please?

## 2024-07-22 NOTE — Telephone Encounter (Signed)
 Spoke w/ Pt's wife- [name], informed that [facility] tried to call twice to schedule. Number to their office given.

## 2024-08-04 ENCOUNTER — Ambulatory Visit
Admission: RE | Admit: 2024-08-04 | Discharge: 2024-08-04 | Disposition: A | Discharge status: Home / Self Care | Source: Ambulatory Visit | Attending: Internal Medicine | Admitting: Internal Medicine

## 2024-08-04 DIAGNOSIS — F0390 Unspecified dementia without behavioral disturbance: Secondary | ICD-10-CM | POA: Diagnosis not present

## 2024-08-16 ENCOUNTER — Other Ambulatory Visit: Payer: Self-pay | Admitting: Internal Medicine

## 2024-08-19 ENCOUNTER — Encounter: Payer: Self-pay | Admitting: Podiatry

## 2024-08-19 ENCOUNTER — Ambulatory Visit: Admitting: Podiatry

## 2024-08-19 DIAGNOSIS — D696 Thrombocytopenia, unspecified: Secondary | ICD-10-CM | POA: Diagnosis not present

## 2024-08-19 DIAGNOSIS — M79674 Pain in right toe(s): Secondary | ICD-10-CM | POA: Diagnosis not present

## 2024-08-19 DIAGNOSIS — M79675 Pain in left toe(s): Secondary | ICD-10-CM

## 2024-08-19 DIAGNOSIS — B351 Tinea unguium: Secondary | ICD-10-CM | POA: Diagnosis not present

## 2024-08-19 NOTE — Progress Notes (Signed)
This patient returns to my office for at risk foot care.  This patient requires this care by a professional since this patient will be at risk due to having thrombocytopenia.  This patient is unable to cut nails himself since the patient cannot reach his nails.These nails are painful walking and wearing shoes.  This patient presents for at risk foot care today.  General Appearance  Alert, conversant and in no acute stress.  Vascular  Dorsalis pedis and posterior tibial  pulses are palpable  bilaterally.  Capillary return is within normal limits  bilaterally. Temperature is within normal limits  bilaterally.  Neurologic  Senn-Weinstein monofilament wire test within normal limits  bilaterally. Muscle power within normal limits bilaterally.  Nails Thick disfigured discolored nails with subungual debris  from hallux to fifth toes bilaterally. No evidence of bacterial infection or drainage bilaterally.  Orthopedic  No limitations of motion  feet .  No crepitus or effusions noted.  No bony pathology or digital deformities noted.  Skin  normotropic skin with no porokeratosis noted bilaterally.  No signs of infections or ulcers noted.     Onychomycosis  Pain in right toes  Pain in left toes  Consent was obtained for treatment procedures.   Mechanical debridement of nails 1-5  bilaterally performed with a nail nipper.  Filed with dremel without incident.    Return office visit    10 weeks.                 Told patient to return for periodic foot care and evaluation due to potential at risk complications.   Jasimine Simms DPM   

## 2024-09-04 ENCOUNTER — Encounter: Payer: Self-pay | Admitting: Internal Medicine

## 2024-09-20 ENCOUNTER — Other Ambulatory Visit: Payer: Self-pay | Admitting: Internal Medicine

## 2024-09-22 MED ORDER — DILTIAZEM HCL ER COATED BEADS 240 MG PO CP24: ORAL_CAPSULE | ORAL | 0 refills | Status: DC

## 2024-09-26 ENCOUNTER — Other Ambulatory Visit: Payer: Self-pay | Admitting: Internal Medicine

## 2024-10-03 ENCOUNTER — Other Ambulatory Visit: Payer: Self-pay | Admitting: Internal Medicine

## 2024-10-09 ENCOUNTER — Encounter: Payer: Self-pay | Admitting: Internal Medicine

## 2024-10-09 ENCOUNTER — Telehealth: Payer: Self-pay | Admitting: *Deleted

## 2024-10-09 ENCOUNTER — Ambulatory Visit: Admitting: *Deleted

## 2024-10-09 ENCOUNTER — Ambulatory Visit: Admitting: Internal Medicine

## 2024-10-09 VITALS — BP 130/74 | HR 52 | Temp 98.1°F | Resp 16 | Ht 69.0 in | Wt 162.1 lb

## 2024-10-09 VITALS — BP 130/74 | HR 52 | Temp 98.1°F | Ht 69.0 in | Wt 162.0 lb

## 2024-10-09 DIAGNOSIS — I10 Essential (primary) hypertension: Secondary | ICD-10-CM | POA: Diagnosis not present

## 2024-10-09 DIAGNOSIS — F0390 Unspecified dementia without behavioral disturbance: Secondary | ICD-10-CM | POA: Diagnosis not present

## 2024-10-09 DIAGNOSIS — R269 Unspecified abnormalities of gait and mobility: Secondary | ICD-10-CM | POA: Diagnosis not present

## 2024-10-09 DIAGNOSIS — H547 Unspecified visual loss: Secondary | ICD-10-CM | POA: Diagnosis not present

## 2024-10-09 DIAGNOSIS — F01B18 Vascular dementia, moderate, with other behavioral disturbance: Secondary | ICD-10-CM | POA: Diagnosis not present

## 2024-10-09 DIAGNOSIS — Z0000 Encounter for general adult medical examination without abnormal findings: Secondary | ICD-10-CM | POA: Diagnosis not present

## 2024-10-09 NOTE — Progress Notes (Signed)
 Subjective:   Darren Barnes is a 83 y.o. who presents for a Medicare Wellness preventive visit.  As a reminder, Annual Wellness Visits don't include a physical exam, and some assessments may be limited, especially if this visit is performed virtually. We may recommend an in-person follow-up visit with your provider if needed.  Visit Complete: In person  Persons Participating in Visit: Patient assisted by spouse.  AWV Questionnaire: No: Patient Medicare AWV questionnaire was not completed prior to this visit.  Cardiac Risk Factors include: advanced age (>75men, >81 women);male gender     Objective:    Today's Vitals   10/09/24 0948  BP: 130/74  Pulse: (!) 52  Temp: 98.1 F (36.7 C)  TempSrc: Oral  SpO2: 98%  Weight: 162 lb (73.5 kg)  Height: 5' 9 (1.753 m)   Body mass index is 23.92 kg/m.     10/09/2024   10:06 AM 10/10/2023   10:29 AM 09/19/2022    8:25 AM 09/16/2021    8:05 AM 09/01/2020    7:38 AM 05/23/2020   11:34 AM 02/02/2019    3:33 PM  Advanced Directives  Does Patient Have a Medical Advance Directive? Yes Yes Yes Yes Yes No No   Type of Estate agent of [place];Living will Healthcare Power of [place];Living will;Out of facility DNR (pink MOST or yellow form) Healthcare Power of [place];Living will Healthcare Power of [place];Living will     Does patient want to make changes to medical advance directive? No - Patient declined No - Patient declined No - Patient declined  No - Patient declined    Copy of Healthcare Power of Attorney in Chart? No - copy requested No - copy requested No - copy requested No - copy requested     Would patient like information on creating a medical advance directive?      No - Patient declined      Data saved with a previous flowsheet row definition    Current Medications (verified) Outpatient Encounter Medications as of 10/09/2024  Medication Sig   aspirin EC 81 MG tablet Take 81 mg by mouth daily. Swallow  whole.   brimonidine (ALPHAGAN) 0.2 % ophthalmic solution Place 1 drop into both eyes 3 (three) times daily.   diltiazem  (CARDIZEM  CD) 240 MG 24 hr capsule TAKE 1 CAPSULE(240 MG) BY MOUTH IN THE MORNING AND AT BEDTIME   donepezil  (ARICEPT ) 5 MG tablet Take 1 tablet (5 mg total) by mouth at bedtime.   dorzolamide-timolol (COSOPT) 22.3-6.8 MG/ML ophthalmic solution Place 1 drop into both eyes 2 (two) times daily.   isosorbide  mononitrate (IMDUR ) 30 MG 24 hr tablet TAKE 1 TABLET(30 MG) BY MOUTH DAILY   ketorolac (ACULAR) 0.5 % ophthalmic solution Place 1 drop into the left eye in the morning and at bedtime.   latanoprost (XALATAN) 0.005 % ophthalmic solution Place 1 drop into the right eye at bedtime.   multivitamin (THERAGRAN) per tablet Take 1 tablet by mouth daily.   nitroGLYCERIN  (NITROSTAT ) 0.4 MG SL tablet Place 1 tablet (0.4 mg total) under the tongue every 5 (five) minutes x 3 doses as needed for chest pain.   rosuvastatin  (CRESTOR ) 10 MG tablet TAKE 1 TABLET(10 MG) BY MOUTH DAILY   tamsulosin  (FLOMAX ) 0.4 MG CAPS capsule Take 1 capsule (0.4 mg total) by mouth in the morning and at bedtime.   telmisartan  (MICARDIS ) 40 MG tablet Take 1 tablet (40 mg total) by mouth daily.   zolpidem  (AMBIEN  CR) 6.25 MG CR tablet  TAKE 1 TABLET(6.25 MG) BY MOUTH AT BEDTIME AS NEEDED FOR SLEEP   No facility-administered encounter medications on file as of 10/09/2024.    Allergies (verified) Patient has no known allergies.   History: Past Medical History:  Diagnosis Date   Cataract    ED (erectile dysfunction)    Glaucoma    both eyes   Hx of adenomatous colonic polyps 07/30/2017   Hyperlipidemia    Hypertension    Left inguinal hernia    Nocturia    saw urology 2012, was eval, rx vesicare (after several other meds failed)   Osteoarthritis    Sleep apnea    no cpap used could not tolerate, mild osa per sleep study   Thrombocytopenia    saw hematology, observe   Past Surgical History:  Procedure  Laterality Date   anal polyp resection  2008   Darren Barnes   Benign tumor from Left arm  yrs ago   colonscopy  08/01/2020   polyps removed   EYE SURGERY  02/22/2020   right eye sx for glaucoma   INGUINAL HERNIA REPAIR Left 09/01/2020   Procedure: OPEN LEFT INGUINAL HERNIA REPAIR WITH MESH;  Surgeon: Darren Berg, MD;  Location: [facility];  Service: General;  Laterality: Left;   KNEE ARTHROSCOPY  2004   Sinus surgery for benign tumor  2000   stem cell injections to both knees  2017   Family History  Problem Relation Age of Onset   Coronary artery disease Father 69       MI at 8   Hypertension Mother        M and F    Diabetes Mother    Stroke Mother 85   Colon cancer Neg Hx    Prostate cancer Neg Hx    Esophageal cancer Neg Hx    Pancreatic cancer Neg Hx    Rectal cancer Neg Hx    Stomach cancer Neg Hx    Social History   Socioeconomic History   Marital status: Married    Spouse name: Not on file   Number of children: 3   Years of education: Not on file   Highest education level: Not on file  Occupational History   Occupation: Retired VP of [employer]    Comment: retired  Tobacco Use   Smoking status: Former    Current packs/day: 0.00    Average packs/day: 0.5 packs/day for 5.0 years (2.5 ttl pk-yrs)    Types: Cigarettes    Start date: 12/24/1953    Quit date: 12/24/1958    Years since quitting: 65.8   Smokeless tobacco: Never  Vaping Use   Vaping status: Never Used  Substance and Sexual Activity   Alcohol use: Not Currently    Comment: socially    Drug use: No   Sexual activity: Not on file  Other Topics Concern   Not on file  Social History Narrative   Lives w/ wife   Lost one son, 2 living children, 8 g-kids   Wife drives             Social Drivers of Corporate investment banker Strain: Low Risk  (10/09/2024)   Overall Financial Resource Strain (CARDIA)    Difficulty of Paying Living Expenses: Not very hard  Food Insecurity: No Food  Insecurity (10/09/2024)   Hunger Vital Sign    Worried About Running Out of Food in the Last Year: Never true    Ran Out of Food in the  Last Year: Never true  Transportation Needs: No Transportation Needs (10/09/2024)   PRAPARE - Administrator, Civil Service (Medical): No    Lack of Transportation (Non-Medical): No  Physical Activity: Insufficiently Active (10/09/2024)   Exercise Vital Sign    Days of Exercise per Week: 5 days    Minutes of Exercise per Session: 20 min  Stress: No Stress Concern Present (10/09/2024)   [employer] of Occupational Health - Occupational Stress Questionnaire    Feeling of Stress: Not at all  Social Connections: Moderately Isolated (10/09/2024)   Social Connection and Isolation Panel    Frequency of Communication with Friends and Family: Twice a week    Frequency of Social Gatherings with Friends and Family: Twice a week    Attends Religious Services: Never    Database administrator or Organizations: No    Attends Engineer, structural: Never    Marital Status: Married    Tobacco Counseling Counseling given: Not Answered    Clinical Intake:  Pre-visit preparation completed: Yes  Pain : No/denies pain     BMI - recorded: 23.92 Nutritional Status: BMI of 19-24  Normal Nutritional Risks: None Diabetes: No  Lab Results  Component Value Date   HGBA1C 5.2 03/20/2024   HGBA1C 5.1 01/31/2021   HGBA1C 4.8 05/16/2009     How often do you need to have someone help you when you read instructions, pamphlets, or other written materials from your doctor or pharmacy?: 1 - Never  Interpreter Needed?: No  Information entered by :: Lolita Libra, CMA(AAMA)   Activities of Daily Living     10/09/2024   10:03 AM 10/09/2024    9:19 AM  In your present state of health, do you have any difficulty performing the following activities:  Hearing? 0 0  Vision? 1 1  Difficulty concentrating or making decisions? 0 0  Walking  or climbing stairs? 1 1  Dressing or bathing? 1 1  Doing errands, shopping? 1 1  Preparing Food and eating ? Y   Using the Toilet? Y   In the past six months, have you accidently leaked urine? Y   Comment wears pad, will discuss with PCP at next appt   Do you have problems with loss of bowel control? N   Managing your Medications? Y   Managing your Finances? Y   Housekeeping or managing your Housekeeping? Y     Patient Care Team: Amon Aloysius BRAVO, MD as PCP - General (Internal Medicine) Santo Stanly LABOR, MD as PCP - Cardiology (Cardiology) Jude Harden GAILS, MD as Attending Physician (Pulmonary Disease) Netta Isadora KATHEE RIGGERS as Physician Assistant (Dermatology) Alena Laverda MATSU, MD as Referring Physician (Ophthalmology) Bond, Reyes Mcardle, MD as Referring Physician (Ophthalmology)  I have updated your Care Teams any recent Medical Services you may have received from other providers in the past year.     Assessment:   This is a routine wellness examination for Darren Barnes.  Hearing/Vision screen Hearing Screening - Comments:: Pt's spouse reports hearing decrease. Will discuss with PCP at next OV. Vision Screening - Comments:: Up to date with routine eye exams with [facility]ren [facility]    Goals Addressed   None    Depression Screen     10/09/2024    9:48 AM 07/08/2024    9:16 AM 03/20/2024    8:41 AM 11/06/2023    1:10 PM 11/06/2023    1:08 PM 10/10/2023   10:34 AM 07/02/2023  9:06 AM  PHQ 2/9 Scores  PHQ - 2 Score 6 3 0 0 0 0 2  PHQ- 9 Score 15 14 0 0   6    Fall Risk     10/09/2024    9:28 AM 07/08/2024    8:43 AM 03/20/2024    8:41 AM 11/06/2023    1:08 PM 10/10/2023   10:29 AM  Fall Risk   Falls in the past year? 0 0 0 0 0  Number falls in past yr: 0 0 0 0 0  Injury with Fall? 0 0 0 0 0  Follow up Falls evaluation completed;Education provided Falls evaluation completed;Education provided Falls evaluation completed;Education provided Falls evaluation completed  Falls evaluation completed    MEDICARE RISK AT HOME:  Medicare Risk at Home Any stairs in or around the home?: Yes If so, are there any without handrails?: No Home free of loose throw rugs in walkways, pet beds, electrical cords, etc?: Yes Adequate lighting in your home to reduce risk of falls?: Yes Life alert?: No Use of a cane, walker or w/c?: Yes (occasionally walker) Grab bars in the bathroom?: Yes Shower chair or bench in shower?: Yes Elevated toilet seat or a handicapped toilet?: Yes  TIMED UP AND GO:  Was the test performed?  Yes  Length of time to ambulate 10 feet: 10 sec Gait slow and steady with assistive device  Cognitive Function: Impaired: Patient has current diagnosis of cognitive impairment.        10/10/2023   10:36 AM 09/19/2022    8:34 AM 09/16/2021    8:09 AM  6CIT Screen  What Year? 0 points 0 points 0 points  What month? 0 points 0 points 0 points  What time? 0 points 0 points 0 points  Count back from 20 0 points 4 points 0 points  Months in reverse 0 points 2 points 0 points  Repeat phrase 4 points 0 points 6 points  Total Score 4 points 6 points 6 points    Immunizations Immunization History  Administered Date(s) Administered   Fluad Quad(high Dose 65+) 09/18/2022   INFLUENZA, HIGH DOSE SEASONAL PF 08/24/2013, 09/19/2019   Influenza Split 09/23/2021, 10/14/2023   Influenza Whole 09/23/2012   Influenza-Unspecified 10/14/2015, 09/17/2016, 09/20/2017   Moderna Covid-19 Vaccine Bivalent Booster 1yrs & up 12/26/2021   Moderna Sars-Covid-2 Vaccination 01/04/2020, 02/01/2020, 10/31/2020, 02/22/2021   Pfizer(Comirnaty)Fall Seasonal Vaccine 12 years and older 10/07/2023   Pneumococcal Conjugate-13 05/20/2014, 09/11/2017   Pneumococcal Polysaccharide-23 04/17/2004, 05/03/2009, 06/28/2021   Td 04/17/2004   Tetanus 05/20/2014   Zoster Recombinant(Shingrix ) 07/03/2019, 09/03/2019   Zoster, Live 05/10/2009    Screening Tests Health Maintenance   Topic Date Due   DTaP/Tdap/Td (3 - Tdap) 05/20/2024   COVID-19 Vaccine (7 - 2025-26 season) 08/24/2024   Medicare Annual Wellness (AWV)  10/09/2024   Influenza Vaccine  03/23/2025 (Originally 07/24/2024)   Pneumococcal Vaccine: 50+ Years  Completed   Zoster Vaccines- Shingrix   Completed   Meningococcal B Vaccine  Aged Out    Health Maintenance Items Addressed: Declines flu vaccine today but will get with spouse at the pharmacy. Will consider Covid and tetanus vaccines at the pharmacy.  Additional Screening:  Vision Screening: Recommended annual ophthalmology exams for early detection of glaucoma and other disorders of the eye. Is the patient up to date with their annual eye exam?  Yes  Who is the provider or what is the name of the office in which the patient attends annual eye exams? Darren  Bond  Dental Screening: Recommended annual dental exams for proper oral hygiene  Community Resource Referral / Chronic Care Management: CRR required this visit?  Yes   CCM required this visit?  No   Plan:    I have personally reviewed and noted the following in the patient's chart:   Medical and social history Use of alcohol, tobacco or illicit drugs  Current medications and supplements including opioid prescriptions. Patient is not currently taking opioid prescriptions. Functional ability and status Nutritional status Physical activity Advanced directives List of other physicians Hospitalizations, surgeries, and ER visits in previous 12 months Vitals Screenings to include cognitive, depression, and falls Referrals and appointments  In addition, I have reviewed and discussed with patient certain preventive protocols, quality metrics, and best practice recommendations. A written personalized care plan for preventive services as well as general preventive health recommendations were provided to patient.   Lolita Libra, CMA   10/09/2024   After Visit Summary: (In Person-Printed) AVS  printed and given to the patient  Notes: see phone note

## 2024-10-09 NOTE — Patient Instructions (Signed)
 Mr. Darren Barnes , Thank you for taking time out of your busy schedule to complete your Annual Wellness Visit with me. I enjoyed our conversation and look forward to speaking with you again next year. I, as well as your care team,  appreciate your ongoing commitment to your health goals. Please review the following plan we discussed and let me know if I can assist you in the future. Your Game plan/ To Do List     Follow up Visits: Next Medicare AWV with our clinical staff: 10/13/25 11am, in person.  Next Office Visit with your provider: 10/29/24 10:15, Dr Amon.  Clinician Recommendations:  Aim for 30 minutes of exercise or brisk walking, 6-8 glasses of water, and 5 servings of fruits and vegetables each day.       This is a list of the screening recommended for you and due dates:  Health Maintenance  Topic Date Due   DTaP/Tdap/Td vaccine (3 - Tdap) 05/20/2024   COVID-19 Vaccine (7 - 2025-26 season) 08/24/2024   Medicare Annual Wellness Visit  10/09/2024   Flu Shot  03/23/2025*   Pneumococcal Vaccine for age over 47  Completed   Zoster (Shingles) Vaccine  Completed   Meningitis B Vaccine  Aged Out  *Topic was postponed. The date shown is not the original due date.    Advanced directives: (Copy Requested) Please bring a copy of your health care power of attorney and living will to the office to be added to your chart at your convenience. You can mail to [facility] [address] [place], [place] [address] or email to [email or web address] Advance Care Planning is important because it:  [x]  Makes sure you receive the medical care that is consistent with your values, goals, and preferences  [x]  It provides guidance to your family and loved ones and reduces their decisional burden about whether or not they are making the right decisions based on your wishes.  Follow the link provided in your after visit summary or read over the paperwork we have mailed to you to help you  started getting your Advance Directives in place. If you need assistance in completing these, please reach out to us  so that we can help you!  See attachments for Preventive Care and Fall Prevention Tips.

## 2024-10-09 NOTE — Progress Notes (Signed)
 Subjective:    Patient ID: Darren Barnes, male    DOB: [birth year], 83 y.o.   MRN: [id number]  DOS:  10/09/2024  ROV Discussed the use of AI scribe software for clinical note transcription with the patient, who gave verbal consent to proceed.  History of Present Illness Darren Barnes is an 83 year old male , he is accompanied by his wife, who is his primary caregiver.  Cognitive impairment and behavioral changes - Significant memory loss getting more noticeable and with increased agitation and irritability - Behavioral changes observed by wife, children, and friends - Requires assistance with dressing and showering - Wife believe he is depressed but the patient denies  sadness or depression symptoms.  Functional status and mobility - Spends most of the day sleeping in a chair - Reduced mobility - Requires assistance with activities of daily living - No recent falls or wandering - Cautious when walking in unfamiliar areas due to vision issues  Appetite and weight changes - Appetite is good - Weight loss and appears smaller  Sleep disturbance - Aricept  administered first at night, Ambien  given later if unable to sleep  Cardiovascular and medication management - History of coronary artery disease - Takes aspirin, isosorbide  30 mg, rosuvastatin , telmisartan  - Has not been taking blood pressure at home recently  Other symptoms - Persistent dripping nose - No major headaches, nausea, vomiting, blood in stools, fever, chills, or night sweats    Wt Readings from Last 3 Encounters:  10/09/24 162 lb 2 oz (73.5 kg)  07/08/24 165 lb 2 oz (74.9 kg)  03/20/24 177 lb (80.3 kg)      Review of Systems See above   Past Medical History:  Diagnosis Date   Cataract    ED (erectile dysfunction)    Glaucoma    both eyes   Hx of adenomatous colonic polyps 07/30/2017   Hyperlipidemia    Hypertension    Left inguinal hernia    Nocturia    saw urology 2012, was eval, rx vesicare (after  several other meds failed)   Osteoarthritis    Sleep apnea    no cpap used could not tolerate, mild osa per sleep study   Thrombocytopenia    saw hematology, observe    Past Surgical History:  Procedure Laterality Date   anal polyp resection  2008   Dr Lavon   Benign tumor from Left arm  yrs ago   colonscopy  08/01/2020   polyps removed   EYE SURGERY  02/22/2020   right eye sx for glaucoma   INGUINAL HERNIA REPAIR Left 09/01/2020   Procedure: OPEN LEFT INGUINAL HERNIA REPAIR WITH MESH;  Surgeon: Vernetta Berg, MD;  Location: [facility];  Service: General;  Laterality: Left;   KNEE ARTHROSCOPY  2004   Sinus surgery for benign tumor  2000   stem cell injections to both knees  2017    Current Outpatient Medications  Medication Instructions   aspirin EC 81 mg, Daily   brimonidine (ALPHAGAN) 0.2 % ophthalmic solution 1 drop, 3 times daily   diltiazem  (CARDIZEM  CD) 240 MG 24 hr capsule TAKE 1 CAPSULE(240 MG) BY MOUTH IN THE MORNING AND AT BEDTIME   donepezil  (ARICEPT ) 5 mg, Oral, Daily at bedtime   dorzolamide-timolol (COSOPT) 22.3-6.8 MG/ML ophthalmic solution 1 drop, 2 times daily   isosorbide  mononitrate (IMDUR ) 30 MG 24 hr tablet TAKE 1 TABLET(30 MG) BY MOUTH DAILY   ketorolac (ACULAR) 0.5 % ophthalmic solution 1 drop, 2 times  daily   latanoprost (XALATAN) 0.005 % ophthalmic solution 1 drop, Daily at bedtime   multivitamin (THERAGRAN) per tablet 1 tablet, Daily   nitroGLYCERIN  (NITROSTAT ) 0.4 mg, Sublingual, Every 5 min x3 PRN   rosuvastatin  (CRESTOR ) 10 MG tablet TAKE 1 TABLET(10 MG) BY MOUTH DAILY   tamsulosin  (FLOMAX ) 0.4 mg, Oral, 2 times daily   telmisartan  (MICARDIS ) 40 mg, Oral, Daily   zolpidem  (AMBIEN  CR) 6.25 MG CR tablet TAKE 1 TABLET(6.25 MG) BY MOUTH AT BEDTIME AS NEEDED FOR SLEEP       Objective:   Physical Exam BP 130/74   Pulse (!) 52   Temp 98.1 F (36.7 C) (Oral)   Resp 16   Ht 5' 9 (1.753 m)   Wt 162 lb 2 oz (73.5 kg)   SpO2 98%    BMI 23.94 kg/m  General:   Well developed, NAD, BMI noted. HEENT:  Normocephalic . Face symmetric, atraumatic Lower extremities: no pretibial edema bilaterally  Skin: Not pale. Not jaundice Neurologic:  alert, pleasant, follow commands, well-groomed. Attempting to perform MMSE but he was unable to finish due to blindness. Oriented only on the season, not day, month, etc. Registration was 3/3, attentional calculation 2/5. Speech normal, gait: Small steps, mild shuffling, turns around without major problems, armswing diminished but symmetric Psych--  Behavior appropriate. No anxious or depressed appearing.      Assessment     Assessment   HTN- on Cardizem  for a while, added losartan  10-2015 Hyperlipidemia ( Lipitor .  Aches ) ED DJD- s/p knee injections (flexogenic) ~ 2016; I signed a parking permit OSA :  CPAP intolerant Glaucoma- LEGALLY BLIND  Abdominal wall lipoma (see OV 05-25-2016) H/o Thrombocytopenia, saw hematology, rx  observation GU: --H/o LUTS- Nocturia  Saw  Urologist ~2012 underwent full workup >> dx BPH and detrusor instability, failed several medications --Re-eval by urology:12-2016 Recurrence cystitis, chronic prostatitis Cardiac CT 02-23-21: CT FFR analysis shows multiple lesions of significance or possible significance: Mid RCA, distal RCA, Distal LAD, Distal LCX. Rx medical mgmt   Assessment & Plan Dementia with behavioral issues (apathy) Dementia with apathy, memory loss, behavioral changes, and altered sleep.  Weight loss has stabilized.    Gait is abnormal, possibly multifactorial but raises questions: Normal pressure hydrocephalus?. See last note, workup essentially negative.  Brain MRI nonacute.  + Microangiopathic ischemic changes. - Continue Donepezil  for memory. - Continue Ambien  for sleep as needed. - Refer to neurology to rule out normal pressure hydrocephalus given abnormal gait. HTN: Good BP today on Cardizem  Imdur  Micardis .  Last BMP  okay Vaccine advised: Will get flu and a COVID booster at the pharmacy. RTC 4 months

## 2024-10-09 NOTE — Patient Instructions (Addendum)
 Go to the front desk for the checkout Please make an appointment for a checkup in 4 months  We are referring you to the neurologist.  If they do not contact you in the next few days please let us  know        Please read more detailed instructions below     VISIT SUMMARY: Today, we discussed your memory loss, behavioral changes, and other symptoms. We also reviewed your medications and overall health status.  YOUR PLAN: DEMENTIA: You have memory loss, behavioral changes, and altered sleep patterns, which are signs of dementia. -Continue taking Donepezil  for memory. -Continue taking Ambien  for sleep as needed. -We will refer you to a neurologist to confirm the dementia diagnosis and rule out other conditions.  SUSPECTED NORMAL PRESSURE HYDROCEPHALUS: Your memory loss and changes in walking may be due to normal pressure hydrocephalus, which could be influenced by your vision and age. -We will refer you to a neurologist for further evaluation.

## 2024-10-09 NOTE — Assessment & Plan Note (Signed)
 Dementia with behavioral issues (apathy) Dementia with apathy, memory loss, behavioral changes, and altered sleep.  Weight loss has stabilized.    Gait is abnormal, possibly multifactorial but raises questions: Normal pressure hydrocephalus?. See last note, workup essentially negative.  Brain MRI nonacute.  + Microangiopathic ischemic changes. - Continue Donepezil  for memory. - Continue Ambien  for sleep as needed. - Refer to neurology to rule out normal pressure hydrocephalus given abnormal gait. HTN: Good BP today on Cardizem  Imdur  Micardis .  Last BMP okay Vaccine advised: Will get flu and a COVID booster at the pharmacy. RTC 4 months

## 2024-10-09 NOTE — Telephone Encounter (Signed)
 Pt had AWV, spouse reports caregiver fatigue as pt needs assistance with ADLs and spouse is sole caregiver. I placed VBCI referral.     Pt also has nocturia(LUTS) per medical history and has urine leakage (wears pad). Doesn't have urologist and spouse states they want to talk with PCP about this at next appt.  Also notes decreased hearing that pt prefers to discuss with PCP at upcoming appt.

## 2024-10-09 NOTE — Telephone Encounter (Signed)
 I agree with referral.  Thank you.

## 2024-10-11 NOTE — Progress Notes (Signed)
 I have reviewed and agree with [facility] documentation.  Aloysius Mech, MD

## 2024-10-13 ENCOUNTER — Other Ambulatory Visit: Payer: Self-pay | Admitting: Internal Medicine

## 2024-10-13 ENCOUNTER — Telehealth: Payer: Self-pay

## 2024-10-13 NOTE — Telephone Encounter (Signed)
 PDMP okay, Rx sent

## 2024-10-13 NOTE — Progress Notes (Signed)
 Complex Care Management Note  Care Guide Note 10/13/2024 Name: Rakwon Letourneau MRN: [id number] DOB: 1941/09/12  Marten Iles is a 83 y.o. year old male who sees Amon Aloysius BRAVO, MD for primary care. I reached out to [employer] by phone today to offer complex care management services.  Mr. Ginley was given information about Complex Care Management services today including:   The Complex Care Management services include support from the care team which includes your Nurse Care Manager, Clinical Social Worker, or Pharmacist.  The Complex Care Management team is here to help remove barriers to the health concerns and goals most important to you. Complex Care Management services are voluntary, and the patient may decline or stop services at any time by request to their care team member.   Complex Care Management Consent Status: Patient agreed to services and verbal consent obtained.   Follow up plan:  Telephone appointment with complex care management team member scheduled for:  10/28/24 at 1:00 p.m.   Encounter Outcome:  Patient Scheduled  Dreama [facility]  [facility], [facility] VBCI Assistant Direct Dial: [phone]  Fax: [phone]

## 2024-10-13 NOTE — Telephone Encounter (Signed)
 Requesting: Ambien  CR 6.25mg   Contract: 02/12/23 UDS: Ambien  only Last Visit: 10/09/24 Next Visit: 02/10/25 Last Refill: 06/15/24 #30 and 3RF  Please Advise

## 2024-10-26 ENCOUNTER — Other Ambulatory Visit: Payer: Self-pay | Admitting: Internal Medicine

## 2024-10-28 ENCOUNTER — Other Ambulatory Visit: Payer: Self-pay | Admitting: Licensed Clinical Social Worker

## 2024-10-28 NOTE — Patient Instructions (Signed)
 Visit Information  Thank you for taking time to visit with me today. Please don't hesitate to contact me if I can be of assistance to you before our next scheduled appointment.  Our next appointment is by telephone on 11/11/24 at 1pm. Please call the care guide team at [phone] if you need to cancel or reschedule your appointment.   Following is a copy of your care plan:   Goals Addressed             This Visit's Progress    VBCI Social Work Care Plan LCSW       Problems:   Cognitive Deficits and Lacks knowledge of community resource: related to in home assistance.  CSW Clinical Goal(s):   Over the next 90 days the Patient will work with LCSW to address needs related to In home services as evidenced by identifying community supports.  Interventions:  Dementia Care:   Current level of care: Home with other family or significant other(s): spouse  Evaluation of patient safety in current living environment and review of Dementia resources and support  ADL's Discussed community support options  Discussed VA options for patient Discussed family support and building support system : Has 2 adult children Discussed private pay options for personal care needs (Spouse reports they would be able to private pay if needed) [facility] Aid & Attendance program (Spouse is aware of the program but not interested at this time) Facility  Spouse is not interested in long term care  Advance Directive Not ready or willing to move forward at this time Active listening / Reflection utilized Caregiver stress acknowledged- declines resources  Consideration on in-home help encouraged : options discussed Emotional Support Provided Motivational Interviewing employed Discussed home cleaning services LCSW will email resources to [email or web address] as requested by spouse   Patient Goals/Self-Care Activities:  Patients spouse will follow up with VA to see what in home services can be provided to  patient. Patients spouse will also review home cleaning services.  Plan:   Telephone follow up appointment with care management team member scheduled for:  11/11/24 at 1pm.        Please call 911 if you are experiencing a Mental Health or Behavioral Health Crisis or need someone to talk to.  Patients wife [name] verbalized understanding of Care plan and visit instructions communicated this visit  Cena Ligas, LCSW Clinical Social Worker VBCI [employer]

## 2024-10-28 NOTE — Patient Outreach (Signed)
 Complex Care Management   Visit Note  10/28/2024  Name:  Darren Barnes MRN: [id number] DOB: 09-12-1941  Situation: Referral received for Complex Care Management related to Dementia I obtained verbal consent from Caregiver.  Visit completed with Caregiver  on the phone. LCSW called and spoke to patients wife, Darren Barnes on the phone. Darren Barnes stated that patient has a recent dementia diagnosis and she could use more help at home. Darren Barnes states that she assists patient with all ADL's and does not believe they need assistance in that area at this time. Darren Barnes does report that she needs assistance with house keeping. LCSW explained that will be a out of pocket expense and Darren Barnes stated she understood. Darren Barnes stated that pt also has VA and she will be calling the VA to se if they can offer any assistance. LCSW and wife discussed the Aid and attendance program, Darren Barnes is aware of this program but not interested at this time.   Background:   Past Medical History:  Diagnosis Date   Cataract    ED (erectile dysfunction)    Glaucoma    both eyes   Hx of adenomatous colonic polyps 07/30/2017   Hyperlipidemia    Hypertension    Left inguinal hernia    Nocturia    saw urology 2012, was eval, rx vesicare (after several other meds failed)   Osteoarthritis    Sleep apnea    no cpap used could not tolerate, mild osa per sleep study   Thrombocytopenia    saw hematology, observe    Assessment: Patient Reported Symptoms:  Cognitive Cognitive Status: Alert and oriented to person, place, and time, Normal speech and language skills Cognitive/Intellectual Conditions Management [RPT]: Other Other: Early dementia   Health Maintenance Behaviors: Annual physical exam, Exercise, Healthy diet, Sleep adequate Healing Pattern: Average Health Facilitated by: Healthy diet, Prayer/meditation, Stress management  Neurological Neurological Review of Symptoms: Not assessed    HEENT HEENT Symptoms Reported: Not  assessed      Cardiovascular Cardiovascular Symptoms Reported: Not assessed    Respiratory Respiratory Symptoms Reported: Not assesed    Endocrine Endocrine Symptoms Reported: Not assessed    Gastrointestinal Gastrointestinal Symptoms Reported: Not assessed      Genitourinary Genitourinary Symptoms Reported: Not assessed    Integumentary Integumentary Symptoms Reported: Not assessed    Musculoskeletal Musculoskelatal Symptoms Reviewed: Unsteady gait, Weakness Additional Musculoskeletal Details: Wife reports patient is sometimes unseteady, he does have a walker. Musculoskeletal Management Strategies: Activity, Medical device, Routine screening Musculoskeletal Self-Management Outcome: 4 (good)      Psychosocial Psychosocial Symptoms Reported: No symptoms reported     Quality of Family Relationships: involved, supportive Do you feel physically threatened by others?: No    10/28/2024    PHQ2-9 Depression Screening   Little interest or pleasure in doing things    Feeling down, depressed, or hopeless    PHQ-2 - Total Score    Trouble falling or staying asleep, or sleeping too much    Feeling tired or having little energy    Poor appetite or overeating     Feeling bad about yourself - or that you are a failure or have let yourself or your family down    Trouble concentrating on things, such as reading the newspaper or watching television    Moving or speaking so slowly that other people could have noticed.  Or the opposite - being so fidgety or restless that you have been moving around a lot more than usual    Thoughts  that you would be better off dead, or hurting yourself in some way    PHQ2-9 Total Score    If you checked off any problems, how difficult have these problems made it for you to do your work, take care of things at home, or get along with other people    Depression Interventions/Treatment      There were no vitals filed for this visit.  Medications Reviewed  Today     Reviewed by Veva Bolt, LCSW (Social Worker) on 10/28/24 at 1618  Med List Status: <None>   Medication Order Taking? Sig Documenting Provider Last Dose Status Informant  aspirin EC 81 MG tablet [id number]  Take 81 mg by mouth daily. Swallow whole. [provider]  Active   brimonidine (ALPHAGAN) 0.2 % ophthalmic solution [id number]  Place 1 drop into both eyes 3 (three) times daily. [provider]  Active   diltiazem  (CARDIZEM  CD) 240 MG 24 hr capsule [id number]  TAKE 1 CAPSULE(240 MG) BY MOUTH IN THE MORNING AND AT BEDTIME Chandrasekhar, Mahesh A, MD  Active   donepezil  (ARICEPT ) 5 MG tablet [id number]  Take 1 tablet (5 mg total) by mouth at bedtime. Paz, Jose E, MD  Active   dorzolamide-timolol (COSOPT) 22.3-6.8 MG/ML ophthalmic solution [id number]  Place 1 drop into both eyes 2 (two) times daily. [provider]  Active Self  isosorbide  mononitrate (IMDUR ) 30 MG 24 hr tablet [id number]  Take 1 tablet (30 mg total) by mouth daily. Patient must call and schedule annual appointment for further refills last attempt Santo Stanly LABOR, MD  Active   ketorolac (ACULAR) 0.5 % ophthalmic solution [id number]  Place 1 drop into the left eye in the morning and at bedtime. Paz, Jose E, MD  Active   latanoprost (XALATAN) 0.005 % ophthalmic solution [id number]  Place 1 drop into the right eye at bedtime. [provider]  Active   multivitamin (THERAGRAN) per tablet [id number]  Take 1 tablet by mouth daily. [provider]  Active Self  nitroGLYCERIN  (NITROSTAT ) 0.4 MG SL tablet [id number]  Place 1 tablet (0.4 mg total) under the tongue every 5 (five) minutes x 3 doses as needed for chest pain. Amon Aloysius BRAVO, MD  Active            Med Note (CANTER, KAYLYN D   Wed Nov 06, 2023  1:03 PM) PRN  rosuvastatin  (CRESTOR ) 10 MG tablet [id number]  TAKE 1 TABLET(10 MG) BY MOUTH DAILY Santo, Mahesh A, MD  Active   tamsulosin  (FLOMAX ) 0.4 MG CAPS capsule [id number]   Take 1 capsule (0.4 mg total) by mouth in the morning and at bedtime. Paz, Jose E, MD  Active   telmisartan  (MICARDIS ) 40 MG tablet [id number]  Take 1 tablet (40 mg total) by mouth daily. Paz, Jose E, MD  Active   zolpidem  (AMBIEN  CR) 6.25 MG CR tablet [id number]  TAKE 1 TABLET(6.25 MG) BY MOUTH AT BEDTIME AS NEEDED FOR SLEEP Paz, Jose E, MD  Active             Recommendation:   PCP Follow-up Continue Current Plan of Care  Follow Up Plan:   Telephone follow up appointment date/time:  11/11/24 at 1pm  Bolt Veva, LCSW Clinical Social Worker VBCI Population Health

## 2024-10-29 ENCOUNTER — Other Ambulatory Visit: Payer: Self-pay | Admitting: Internal Medicine

## 2024-10-29 ENCOUNTER — Ambulatory Visit: Admitting: Podiatry

## 2024-10-29 ENCOUNTER — Telehealth: Payer: Self-pay | Admitting: Internal Medicine

## 2024-10-29 NOTE — Telephone Encounter (Signed)
*  STAT* If patient is at the pharmacy, call can be transferred to refill team.   1. Which medications need to be refilled? (please list name of each medication and dose if known)  diltiazem  (CARDIZEM  CD) 240 MG 24 hr capsule   isosorbide  mononitrate (IMDUR ) 30 MG 24 hr tablet  rosuvastatin  (CRESTOR ) 10 MG tablet   2. Would you like to learn more about the convenience, safety, & potential cost savings by using the [facility]? No   3. Are you open to using the [facility] (Type [facility]. ) No   4. Which pharmacy/location (including street and city if local pharmacy) is medication to be sent to?  [id number] - [place], [place] - [address]   5. Do they need a 30 day or 90 day supply? 90 day  Pt has scheduled appt on 11/26 and is out of medications

## 2024-10-30 MED ORDER — ISOSORBIDE MONONITRATE ER 30 MG PO TB24: 30.0000 mg | ORAL_TABLET | Freq: Every day | ORAL | 0 refills | Status: DC

## 2024-10-30 MED ORDER — DILTIAZEM HCL ER COATED BEADS 240 MG PO CP24: ORAL_CAPSULE | ORAL | 0 refills | Status: DC

## 2024-10-30 MED ORDER — ROSUVASTATIN CALCIUM 10 MG PO TABS: 10.0000 mg | ORAL_TABLET | Freq: Every day | ORAL | 0 refills | Status: DC

## 2024-10-30 NOTE — Telephone Encounter (Signed)
 Pt scheduled to see Lum Louis, NP, 11/18/24.  20 day supply Rosuvastatin , Diltiazem , & Isosorbide  have been sent to [facility].

## 2024-11-04 ENCOUNTER — Other Ambulatory Visit: Payer: Self-pay | Admitting: Internal Medicine

## 2024-11-11 ENCOUNTER — Telehealth: Payer: Self-pay | Admitting: Licensed Clinical Social Worker

## 2024-11-14 ENCOUNTER — Other Ambulatory Visit: Payer: Self-pay | Admitting: Internal Medicine

## 2024-11-16 ENCOUNTER — Encounter: Payer: Self-pay | Admitting: *Deleted

## 2024-11-18 ENCOUNTER — Encounter: Payer: Self-pay | Admitting: Emergency Medicine

## 2024-11-18 ENCOUNTER — Ambulatory Visit: Attending: Emergency Medicine | Admitting: Emergency Medicine

## 2024-11-18 VITALS — BP 120/50 | HR 58 | Ht 69.0 in | Wt 162.0 lb

## 2024-11-18 DIAGNOSIS — E785 Hyperlipidemia, unspecified: Secondary | ICD-10-CM | POA: Diagnosis not present

## 2024-11-18 DIAGNOSIS — G4733 Obstructive sleep apnea (adult) (pediatric): Secondary | ICD-10-CM | POA: Diagnosis not present

## 2024-11-18 DIAGNOSIS — I10 Essential (primary) hypertension: Secondary | ICD-10-CM | POA: Diagnosis not present

## 2024-11-18 DIAGNOSIS — I2510 Atherosclerotic heart disease of native coronary artery without angina pectoris: Secondary | ICD-10-CM

## 2024-11-18 MED ORDER — DILTIAZEM HCL ER COATED BEADS 240 MG PO CP24: ORAL_CAPSULE | ORAL | 3 refills | Status: AC

## 2024-11-18 MED ORDER — ROSUVASTATIN CALCIUM 10 MG PO TABS: 10.0000 mg | ORAL_TABLET | Freq: Every day | ORAL | 3 refills | Status: AC

## 2024-11-18 MED ORDER — ISOSORBIDE MONONITRATE ER 30 MG PO TB24: 30.0000 mg | ORAL_TABLET | Freq: Every day | ORAL | 3 refills | Status: AC

## 2024-11-18 MED ORDER — TELMISARTAN 40 MG PO TABS: 40.0000 mg | ORAL_TABLET | Freq: Every day | ORAL | 3 refills | Status: AC

## 2024-11-18 NOTE — Patient Instructions (Addendum)
 Medication Instructions:  NO CHANGES  Lab Work: NONE TO BE DONE TODAY.  Testing/Procedures: NONE  Follow-Up: At [facility], you and your health needs are our priority.  As part of our continuing mission to provide you with exceptional heart care, our providers are all part of one team.  This team includes your primary Cardiologist (physician) and Advanced Practice Providers or APPs (Physician Assistants and Nurse Practitioners) who all work together to provide you with the care you need, when you need it.  Your next appointment:   1 YEAR  Provider:   Stanly DELENA Leavens, MD OR Lum Louis, NP

## 2024-11-18 NOTE — Progress Notes (Signed)
 Cardiology Office Note:    Date:  11/18/2024  ID:  Darren Barnes, DOB [birth year], MRN [id number] PCP: Amon Aloysius BRAVO, MD  [facility] Providers Cardiologist:  Stanly DELENA Leavens, MD Cardiology APP:  Rana Lum CROME, NP       Patient Profile:       Chief Complaint: 1 year follow-up History of Present Illness:  Darren Barnes is a 83 y.o. male with visit-pertinent history of hypertension, hyperlipidemia, OSA, coronary artery disease, dementia  Coronary artery disease 3 v CAD by CCTA >> pt opted for med Rx and deferred LHC  CCTA 3/322: CAC score 885 (82%); RCA prox 25-49, mid 50-70, dist 25-49 // LM dist < 10 and 20 // LAD prox 25-49, mid 25-49, dist 25-49; oD1 and mD1 25-49 // LCx prox 50-70, mid 25-49 then 50-70 >> FFR: dLAD 0.75; dLCx 0.73; mRCA 0.82, dRCA 0.63 TTE 03/03/21: EF 60-65, no RWMA, Gr 1 DD, NL RVSF, RAP 3 Hypertension  Hyperlipidemia  OSA  Aortic atherosclerosis  LE Arterial US  11/28/12: Normal   Coronary CTA 02/2021 showed coronary calcium  score of 885 (82nd percentile) with moderate stenosis.  FFR analysis showed multiple lesions of significance for possible significance to mid RCA, distal RCA, distal LAD, and distal LCx.  He had not had angina at that time and preferred medical management.  Echocardiogram 02/2021 showed LVEF 60 to 65%, no RWMA, grade 1 DD, RV function size normal, no valvular abnormalities.  Last seen in clinic on 09/11/2023.  He been feeling well and goal directed therapy.  His blood pressure and cholesterol levels were well optimized.  He is without any reported chest pain.  He was noted to have some nipple discharge and itching and was to follow-up with his primary care provider.   Discussed the use of AI scribe software for clinical note transcription with the patient, who gave verbal consent to proceed.  History of Present Illness Darren Barnes is an 83 year old male with coronary artery disease who presents for a routine follow-up.  Today  he is doing well without acute cardiovascular concerns.  He has no chest pain, shortness of breath, weight gain, leg swelling, lightheadedness, dizziness, or syncope. He takes aspirin, diltiazem , isosorbide , and rosuvastatin .  He is not very active given that he is now blind.. He has sleep apnea but does not use CPAP.  He has dementia, diagnosed about two to three years ago. His caregiver reports he is doing okay apart from cognitive issues.   Review of systems:  Please see the history of present illness. All other systems are reviewed and otherwise negative.      Studies Reviewed:    EKG Interpretation Date/Time:  Wednesday November 18 2024 09:58:13 EST Ventricular Rate:  58 PR Interval:  224 QRS Duration:  80 QT Interval:  394 QTC Calculation: 386 R Axis:   50  Text Interpretation: Sinus bradycardia with 1st degree A-V block When compared with ECG of 13-Aug-2007 08:19, PR interval has increased T wave inversion no longer evident in Inferior leads Confirmed by Rana Lum [phone]) on 11/18/2024 10:29:30 AM    Coronary CTA 02/23/2021 1. Coronary calcium  score of 885. This was 82nd percentile for age, sex, and race matched control.   2. Normal coronary origin.  Right dominance.   3. CAD-RADS 3. Moderate stenosis. Consider symptom-guided anti-ischemic pharmacotherapy as well as risk factor modification per guideline directed care. Additional analysis with CT FFR will be submitted.   4. Aortic atherosclerosis noted.  FFR 1. CT FFR  analysis shows multiple lesions of significance or possible significance: Mid RCA, distal RCA, Distal LAD, Distal LCX.  Echocardiogram 03/03/2021  1. Left ventricular ejection fraction, by estimation, is 60 to 65%. The  left ventricle has normal function. The left ventricle has no regional  wall motion abnormalities. Left ventricular diastolic parameters are  consistent with Grade I diastolic  dysfunction (impaired relaxation).   2. Right  ventricular systolic function is normal. The right ventricular  size is normal.   3. The mitral valve is normal in structure. No evidence of mitral valve  regurgitation. No evidence of mitral stenosis.   4. The aortic valve is normal in structure. Aortic valve regurgitation is  not visualized. No aortic stenosis is present.   5. The inferior vena cava is normal in size with greater than 50%  respiratory variability, suggesting right atrial pressure of 3 mmHg.  Risk Assessment/Calculations:              Physical Exam:   VS:  BP (!) 120/50 (BP Location: Right Arm, Patient Position: Sitting, Cuff Size: Normal)   Pulse (!) 58   Ht 5' 9 (1.753 m)   Wt 162 lb (73.5 kg)   BMI 23.92 kg/m    Wt Readings from Last 3 Encounters:  11/18/24 162 lb (73.5 kg)  10/09/24 162 lb (73.5 kg)  10/09/24 162 lb 2 oz (73.5 kg)    GEN: Well nourished, well developed in no acute distress NECK: No JVD; No carotid bruits CARDIAC: RRR, no murmurs, rubs, gallops RESPIRATORY:  Clear to auscultation without rales, wheezing or rhonchi  ABDOMEN: Soft, non-tender, non-distended EXTREMITIES:  No edema; No acute deformity      Assessment and Plan:  Coronary artery disease Coronary CTA 02/2021 with CAC of 885 (82nd percentile) with FFR showing multiple lesions of significance to mid RCA, distal RCA, distal LAD, and distal LCx.  He had not had angina and shared decision making was made to treat medically - EKG without acute ischemic changes - Today he is stable without chest pains.  He denies any symptoms concerning for active angina.  No indication of further ischemic evaluation at this time - Continue aspirin 81 mg daily, diltiazem  240 mg twice daily, isosorbide  30 mg daily, telmisartan  40 mg daily, and rosuvastatin  10 mg daily  Hypertension Today blood pressure is well-controlled at 120/50 - His diastolic BP is slightly low, will need to monitor closely - No changes to current regimen. Continue current  medication  Hyperlipidemia LDL 47 on 06/2024 and well-controlled - Continue rosuvastatin  10 mg daily  Obstructive sleep apnea - No longer on CPAP - Not amenable to CPAP      Dispo:  Return in about 1 year (around 11/18/2025).  Signed, Lum LITTIE Louis, NP

## 2024-11-27 ENCOUNTER — Other Ambulatory Visit: Payer: Self-pay | Admitting: Licensed Clinical Social Worker

## 2024-11-27 NOTE — Patient Outreach (Signed)
 Complex Care Management   Visit Note  11/27/2024  Name:  Darren Barnes MRN: [id number] DOB: 03-17-1941  Situation: Referral received for Complex Care Management related to Dementia I obtained verbal consent from spouse Mahlik Lenn.  Visit completed with Spouse [name] Razor  on the phone.  Background:   Past Medical History:  Diagnosis Date   Cataract    ED (erectile dysfunction)    Glaucoma    both eyes   Hx of adenomatous colonic polyps 07/30/2017   Hyperlipidemia    Hypertension    Left inguinal hernia    Nocturia    saw urology 2012, was eval, rx vesicare (after several other meds failed)   Osteoarthritis    Sleep apnea    no cpap used could not tolerate, mild osa per sleep study   Thrombocytopenia    saw hematology, observe    Assessment: LCSW spoke to patients spouse [name] on the phone. [name] stated that at this time she feels that she does not need any assistance taking care of her husband at this time. [name] stated she will discuss with patients MD when she feels like they need extra support. LCSW discussed closing referral for now, [employer].   Patient Reported Symptoms:  Cognitive Cognitive Status: Unable to Assess      Neurological Neurological Review of Symptoms: Not assessed    HEENT HEENT Symptoms Reported: Not assessed      Cardiovascular Cardiovascular Symptoms Reported: Not assessed    Respiratory Respiratory Symptoms Reported: Not assesed    Endocrine Endocrine Symptoms Reported: Not assessed    Gastrointestinal Gastrointestinal Symptoms Reported: Not assessed      Genitourinary Genitourinary Symptoms Reported: Not assessed    Integumentary Integumentary Symptoms Reported: Not assessed    Musculoskeletal Musculoskelatal Symptoms Reviewed: Not assessed        Psychosocial Psychosocial Symptoms Reported: Not assessed          11/27/2024    PHQ2-9 Depression Screening   Little interest or pleasure in doing things    Feeling  down, depressed, or hopeless    PHQ-2 - Total Score    Trouble falling or staying asleep, or sleeping too much    Feeling tired or having little energy    Poor appetite or overeating     Feeling bad about yourself - or that you are a failure or have let yourself or your family down    Trouble concentrating on things, such as reading the newspaper or watching television    Moving or speaking so slowly that other people could have noticed.  Or the opposite - being so fidgety or restless that you have been moving around a lot more than usual    Thoughts that you would be better off dead, or hurting yourself in some way    PHQ2-9 Total Score    If you checked off any problems, how difficult have these problems made it for you to do your work, take care of things at home, or get along with other people    Depression Interventions/Treatment      There were no vitals filed for this visit.    Medications Reviewed Today   Medications were not reviewed in this encounter     Recommendation:   PCP Follow-up Continue Current Plan of Care  Follow Up Plan:   Care Management case will be closed. Patient has been provided contact information should new needs arise.   Cena Ligas, LCSW Clinical Social Worker [facility]BC[facility] Population Health

## 2024-11-27 NOTE — Patient Instructions (Signed)
 Visit Information  Thank you for taking time to visit with me today. Please don't hesitate to contact me if I can be of assistance to you in the future.   Please call the care guide team at [phone] if you need to cancel or reschedule your appointment.   Following is a copy of your care plan:   Goals Addressed             This Visit's Progress    VBCI Social Work Care Plan LCSW       Problems:   Cognitive Deficits and Lacks knowledge of community resource: related to in home assistance.  CSW Clinical Goal(s):   Over the next 90 days the Patient will work with LCSW to address needs related to In home services as evidenced by identifying community supports.  Interventions:  Dementia Care:   Current level of care: Home with other family or significant other(s): spouse  Evaluation of patient safety in current living environment and review of Dementia resources and support  ADL's Discussed community support options  Discussed VA options for patient Discussed family support and building support system : Has 2 adult children Discussed private pay options for personal care needs (Spouse reports they would be able to private pay if needed) [facility] Aid & Attendance program (Spouse is aware of the program but not interested at this time) Facility  Spouse is not interested in long term care  Advance Directive Not ready or willing to move forward at this time Active listening / Reflection utilized Caregiver stress acknowledged- declines resources  Consideration on in-home help encouraged : options discussed Emotional Support Provided Motivational Interviewing employed Discussed home cleaning services LCSW will email resources to [email or web address] as requested by spouse   Patient Goals/Self-Care Activities:  Patients spouse will follow up with VA to see what in home services can be provided to patient. Patients spouse will also review home cleaning services.  Plan:   No  further follow up required: Patients wife no longer interested in home assistance.        Please call 911 if you are experiencing a Mental Health or Behavioral Health Crisis or need someone to talk to.  Spouse [name] Razor verbalized understanding of Care plan and visit instructions communicated this visit  Cena Ligas, LCSW Clinical Social Worker VBCI [employer]

## 2025-01-06 ENCOUNTER — Other Ambulatory Visit: Payer: Self-pay | Admitting: Internal Medicine

## 2025-01-21 ENCOUNTER — Ambulatory Visit: Admitting: Podiatry

## 2025-01-21 ENCOUNTER — Encounter: Payer: Self-pay | Admitting: Podiatry

## 2025-01-21 DIAGNOSIS — M79674 Pain in right toe(s): Secondary | ICD-10-CM | POA: Diagnosis not present

## 2025-01-21 DIAGNOSIS — B351 Tinea unguium: Secondary | ICD-10-CM

## 2025-01-21 DIAGNOSIS — D696 Thrombocytopenia, unspecified: Secondary | ICD-10-CM

## 2025-01-21 DIAGNOSIS — M79675 Pain in left toe(s): Secondary | ICD-10-CM | POA: Diagnosis not present

## 2025-01-21 NOTE — Progress Notes (Signed)
 This patient returns to my office for at risk foot care.  This patient requires this care by a professional since this patient will be at risk due to having thrombocytopenia.  This patient is unable to cut nails himself since the patient cannot reach his nails.These nails are painful walking and wearing shoes.  This patient presents for at risk foot care today.  General Appearance  Alert, conversant and in no acute stress.  Vascular  Dorsalis pedis and posterior tibial  pulses are  weakly palpable  bilaterally.  Capillary return is within normal limits  bilaterally. Temperature is within normal limits  bilaterally.  Neurologic  Senn-Weinstein monofilament wire test within normal limits  bilaterally. Muscle power within normal limits bilaterally.  Nails Thick disfigured discolored nails with subungual debris  from hallux to fifth toes bilaterally. No evidence of bacterial infection or drainage bilaterally.  Orthopedic  No limitations of motion  feet .  No crepitus or effusions noted.  No bony pathology or digital deformities noted.  Skin  normotropic skin with no porokeratosis noted bilaterally.  No signs of infections or ulcers noted.     Onychomycosis  Pain in right toes  Pain in left toes  Consent was obtained for treatment procedures.   Mechanical debridement of nails 1-5  bilaterally performed with a nail nipper.  Filed with dremel without incident.    Return office visit    12weeks                Told patient to return for periodic foot care and evaluation due to potential at risk complications.   Cordella Bold DPM

## 2025-02-10 ENCOUNTER — Ambulatory Visit: Admitting: Internal Medicine

## 2025-02-16 ENCOUNTER — Ambulatory Visit: Admitting: Neurology

## 2025-04-21 ENCOUNTER — Ambulatory Visit: Admitting: Podiatry

## 2025-10-13 ENCOUNTER — Ambulatory Visit
